# Patient Record
Sex: Female | Born: 1945 | Race: White | Hispanic: No | Smoking: Former smoker
Health system: Southern US, Community
[De-identification: ages and names within clinical notes are randomized; demographics above are authoritative.]

## PROBLEM LIST (undated history)

## (undated) DIAGNOSIS — Z9289 Personal history of other medical treatment: Secondary | ICD-10-CM

## (undated) DIAGNOSIS — Z807 Family history of other malignant neoplasms of lymphoid, hematopoietic and related tissues: Secondary | ICD-10-CM

## (undated) DIAGNOSIS — I10 Essential (primary) hypertension: Secondary | ICD-10-CM

## (undated) DIAGNOSIS — I7300 Raynaud's syndrome without gangrene: Secondary | ICD-10-CM

## (undated) DIAGNOSIS — M1990 Unspecified osteoarthritis, unspecified site: Secondary | ICD-10-CM

## (undated) DIAGNOSIS — M810 Age-related osteoporosis without current pathological fracture: Secondary | ICD-10-CM

## (undated) DIAGNOSIS — K219 Gastro-esophageal reflux disease without esophagitis: Secondary | ICD-10-CM

## (undated) DIAGNOSIS — J309 Allergic rhinitis, unspecified: Secondary | ICD-10-CM

## (undated) DIAGNOSIS — H269 Unspecified cataract: Secondary | ICD-10-CM

## (undated) DIAGNOSIS — E739 Lactose intolerance, unspecified: Secondary | ICD-10-CM

## (undated) DIAGNOSIS — F419 Anxiety disorder, unspecified: Secondary | ICD-10-CM

## (undated) DIAGNOSIS — K5790 Diverticulosis of intestine, part unspecified, without perforation or abscess without bleeding: Secondary | ICD-10-CM

## (undated) DIAGNOSIS — N904 Leukoplakia of vulva: Secondary | ICD-10-CM

## (undated) DIAGNOSIS — R351 Nocturia: Secondary | ICD-10-CM

## (undated) DIAGNOSIS — R0789 Other chest pain: Secondary | ICD-10-CM

## (undated) DIAGNOSIS — R042 Hemoptysis: Secondary | ICD-10-CM

## (undated) DIAGNOSIS — J189 Pneumonia, unspecified organism: Secondary | ICD-10-CM

## (undated) DIAGNOSIS — M549 Dorsalgia, unspecified: Secondary | ICD-10-CM

## (undated) DIAGNOSIS — J45909 Unspecified asthma, uncomplicated: Secondary | ICD-10-CM

## (undated) DIAGNOSIS — E7800 Pure hypercholesterolemia, unspecified: Secondary | ICD-10-CM

## (undated) DIAGNOSIS — G4700 Insomnia, unspecified: Secondary | ICD-10-CM

## (undated) HISTORY — DX: Personal history of other medical treatment: Z92.89

## (undated) HISTORY — DX: Dorsalgia, unspecified: M54.9

## (undated) HISTORY — DX: Essential (primary) hypertension: I10

## (undated) HISTORY — DX: Age-related osteoporosis without current pathological fracture: M81.0

## (undated) HISTORY — DX: Nocturia: R35.1

## (undated) HISTORY — DX: Raynaud's syndrome without gangrene: I73.00

## (undated) HISTORY — DX: Unspecified asthma, uncomplicated: J45.909

## (undated) HISTORY — DX: Diverticulosis of intestine, part unspecified, without perforation or abscess without bleeding: K57.90

## (undated) HISTORY — DX: Insomnia, unspecified: G47.00

## (undated) HISTORY — DX: Leukoplakia of vulva: N90.4

## (undated) HISTORY — DX: Lactose intolerance, unspecified: E73.9

## (undated) HISTORY — DX: Allergic rhinitis, unspecified: J30.9

## (undated) HISTORY — DX: Other chest pain: R07.89

## (undated) HISTORY — DX: Gastro-esophageal reflux disease without esophagitis: K21.9

## (undated) HISTORY — PX: COLONOSCOPY: SHX174

## (undated) HISTORY — PX: MYOMECTOMY: SHX85

## (undated) HISTORY — PX: MANDIBLE SURGERY: SHX707

## (undated) HISTORY — DX: Family history of other malignant neoplasms of lymphoid, hematopoietic and related tissues: Z80.7

## (undated) HISTORY — DX: Unspecified osteoarthritis, unspecified site: M19.90

## (undated) HISTORY — PX: TONSILLECTOMY: SUR1361

## (undated) HISTORY — DX: Hemoptysis: R04.2

## (undated) HISTORY — DX: Pure hypercholesterolemia, unspecified: E78.00

---

## 1969-12-12 HISTORY — PX: INGUINAL HERNIA REPAIR: SUR1180

## 1985-12-12 HISTORY — PX: TUBAL LIGATION: SHX77

## 1999-02-16 ENCOUNTER — Other Ambulatory Visit: Admission: RE | Admit: 1999-02-16 | Discharge: 1999-02-16 | Payer: Self-pay | Admitting: Obstetrics and Gynecology

## 1999-07-14 ENCOUNTER — Encounter (INDEPENDENT_AMBULATORY_CARE_PROVIDER_SITE_OTHER): Payer: Self-pay | Admitting: Specialist

## 1999-07-14 ENCOUNTER — Other Ambulatory Visit: Admission: RE | Admit: 1999-07-14 | Discharge: 1999-07-14 | Payer: Self-pay | Admitting: Obstetrics and Gynecology

## 1999-12-13 HISTORY — PX: BREAST SURGERY: SHX581

## 1999-12-31 ENCOUNTER — Encounter: Admission: RE | Admit: 1999-12-31 | Discharge: 2000-02-21 | Payer: Self-pay | Admitting: Physician Assistant

## 2000-02-21 ENCOUNTER — Encounter: Admission: RE | Admit: 2000-02-21 | Discharge: 2000-05-21 | Payer: Self-pay | Admitting: Sports Medicine

## 2000-02-22 ENCOUNTER — Other Ambulatory Visit: Admission: RE | Admit: 2000-02-22 | Discharge: 2000-02-22 | Payer: Self-pay | Admitting: Obstetrics and Gynecology

## 2000-02-23 ENCOUNTER — Encounter (INDEPENDENT_AMBULATORY_CARE_PROVIDER_SITE_OTHER): Payer: Self-pay

## 2000-02-23 ENCOUNTER — Other Ambulatory Visit: Admission: RE | Admit: 2000-02-23 | Discharge: 2000-02-23 | Payer: Self-pay | Admitting: Obstetrics and Gynecology

## 2000-11-07 ENCOUNTER — Encounter (INDEPENDENT_AMBULATORY_CARE_PROVIDER_SITE_OTHER): Payer: Self-pay | Admitting: Specialist

## 2000-11-07 ENCOUNTER — Other Ambulatory Visit: Admission: RE | Admit: 2000-11-07 | Discharge: 2000-11-07 | Payer: Self-pay | Admitting: Plastic Surgery

## 2001-02-09 ENCOUNTER — Other Ambulatory Visit: Admission: RE | Admit: 2001-02-09 | Discharge: 2001-02-09 | Payer: Self-pay | Admitting: Obstetrics and Gynecology

## 2001-12-12 HISTORY — PX: HYSTEROSCOPY: SHX211

## 2002-02-11 ENCOUNTER — Other Ambulatory Visit: Admission: RE | Admit: 2002-02-11 | Discharge: 2002-02-11 | Payer: Self-pay | Admitting: Obstetrics and Gynecology

## 2002-04-27 ENCOUNTER — Encounter (INDEPENDENT_AMBULATORY_CARE_PROVIDER_SITE_OTHER): Payer: Self-pay

## 2002-04-27 ENCOUNTER — Ambulatory Visit (HOSPITAL_COMMUNITY): Admission: RE | Admit: 2002-04-27 | Discharge: 2002-04-27 | Payer: Self-pay | Admitting: Obstetrics and Gynecology

## 2003-07-28 ENCOUNTER — Other Ambulatory Visit: Admission: RE | Admit: 2003-07-28 | Discharge: 2003-07-28 | Payer: Self-pay | Admitting: Obstetrics and Gynecology

## 2004-07-28 ENCOUNTER — Other Ambulatory Visit: Admission: RE | Admit: 2004-07-28 | Discharge: 2004-07-28 | Payer: Self-pay | Admitting: Obstetrics and Gynecology

## 2004-10-07 ENCOUNTER — Encounter: Admission: RE | Admit: 2004-10-07 | Discharge: 2004-10-07 | Payer: Self-pay | Admitting: Gastroenterology

## 2005-08-03 ENCOUNTER — Other Ambulatory Visit: Admission: RE | Admit: 2005-08-03 | Discharge: 2005-08-03 | Payer: Self-pay | Admitting: Obstetrics and Gynecology

## 2005-12-12 DIAGNOSIS — R042 Hemoptysis: Secondary | ICD-10-CM

## 2005-12-12 HISTORY — DX: Hemoptysis: R04.2

## 2006-03-25 ENCOUNTER — Emergency Department (HOSPITAL_COMMUNITY): Admission: EM | Admit: 2006-03-25 | Discharge: 2006-03-25 | Payer: Self-pay | Admitting: Emergency Medicine

## 2006-03-26 ENCOUNTER — Encounter: Admission: RE | Admit: 2006-03-26 | Discharge: 2006-03-26 | Payer: Self-pay | Admitting: Gastroenterology

## 2006-03-27 ENCOUNTER — Ambulatory Visit: Payer: Self-pay | Admitting: Internal Medicine

## 2006-04-11 ENCOUNTER — Ambulatory Visit: Payer: Self-pay | Admitting: Internal Medicine

## 2006-04-12 ENCOUNTER — Ambulatory Visit: Payer: Self-pay | Admitting: Cardiology

## 2006-04-17 ENCOUNTER — Ambulatory Visit: Payer: Self-pay | Admitting: Internal Medicine

## 2006-05-01 ENCOUNTER — Ambulatory Visit: Payer: Self-pay | Admitting: Internal Medicine

## 2006-07-03 ENCOUNTER — Ambulatory Visit: Payer: Self-pay | Admitting: Internal Medicine

## 2006-08-09 ENCOUNTER — Other Ambulatory Visit: Admission: RE | Admit: 2006-08-09 | Discharge: 2006-08-09 | Payer: Self-pay | Admitting: Obstetrics and Gynecology

## 2006-12-12 DIAGNOSIS — N904 Leukoplakia of vulva: Secondary | ICD-10-CM

## 2006-12-12 HISTORY — DX: Leukoplakia of vulva: N90.4

## 2007-08-15 ENCOUNTER — Other Ambulatory Visit: Admission: RE | Admit: 2007-08-15 | Discharge: 2007-08-15 | Payer: Self-pay | Admitting: Obstetrics and Gynecology

## 2007-12-13 HISTORY — PX: FOOT SURGERY: SHX648

## 2008-09-15 ENCOUNTER — Ambulatory Visit: Payer: Self-pay | Admitting: Obstetrics and Gynecology

## 2008-09-15 ENCOUNTER — Other Ambulatory Visit: Admission: RE | Admit: 2008-09-15 | Discharge: 2008-09-15 | Payer: Self-pay | Admitting: Obstetrics and Gynecology

## 2008-09-15 ENCOUNTER — Encounter: Payer: Self-pay | Admitting: Obstetrics and Gynecology

## 2008-09-24 ENCOUNTER — Ambulatory Visit: Payer: Self-pay | Admitting: Obstetrics and Gynecology

## 2008-10-28 ENCOUNTER — Encounter: Admission: RE | Admit: 2008-10-28 | Discharge: 2008-10-28 | Payer: Self-pay | Admitting: Gastroenterology

## 2008-12-22 ENCOUNTER — Ambulatory Visit: Payer: Self-pay | Admitting: Internal Medicine

## 2008-12-22 DIAGNOSIS — R05 Cough: Secondary | ICD-10-CM

## 2008-12-22 DIAGNOSIS — I10 Essential (primary) hypertension: Secondary | ICD-10-CM | POA: Insufficient documentation

## 2008-12-22 DIAGNOSIS — R059 Cough, unspecified: Secondary | ICD-10-CM | POA: Insufficient documentation

## 2008-12-22 DIAGNOSIS — R042 Hemoptysis: Secondary | ICD-10-CM | POA: Insufficient documentation

## 2008-12-22 DIAGNOSIS — E785 Hyperlipidemia, unspecified: Secondary | ICD-10-CM | POA: Insufficient documentation

## 2008-12-23 ENCOUNTER — Telehealth (INDEPENDENT_AMBULATORY_CARE_PROVIDER_SITE_OTHER): Payer: Self-pay | Admitting: *Deleted

## 2008-12-25 ENCOUNTER — Telehealth (INDEPENDENT_AMBULATORY_CARE_PROVIDER_SITE_OTHER): Payer: Self-pay | Admitting: *Deleted

## 2008-12-30 ENCOUNTER — Ambulatory Visit: Payer: Self-pay | Admitting: Internal Medicine

## 2009-02-02 ENCOUNTER — Ambulatory Visit: Payer: Self-pay | Admitting: Obstetrics and Gynecology

## 2009-05-05 ENCOUNTER — Encounter: Payer: Self-pay | Admitting: Internal Medicine

## 2009-07-27 ENCOUNTER — Ambulatory Visit: Payer: Self-pay | Admitting: Obstetrics and Gynecology

## 2009-09-24 ENCOUNTER — Ambulatory Visit: Payer: Self-pay | Admitting: Obstetrics and Gynecology

## 2009-09-24 ENCOUNTER — Encounter: Payer: Self-pay | Admitting: Obstetrics and Gynecology

## 2009-09-24 ENCOUNTER — Other Ambulatory Visit: Admission: RE | Admit: 2009-09-24 | Discharge: 2009-09-24 | Payer: Self-pay | Admitting: Obstetrics and Gynecology

## 2009-10-19 ENCOUNTER — Ambulatory Visit: Payer: Self-pay | Admitting: Internal Medicine

## 2009-10-21 ENCOUNTER — Telehealth: Payer: Self-pay | Admitting: Adult Health

## 2009-10-26 ENCOUNTER — Telehealth (INDEPENDENT_AMBULATORY_CARE_PROVIDER_SITE_OTHER): Payer: Self-pay | Admitting: *Deleted

## 2009-10-27 ENCOUNTER — Ambulatory Visit: Payer: Self-pay | Admitting: Internal Medicine

## 2009-10-27 DIAGNOSIS — J82 Pulmonary eosinophilia, not elsewhere classified: Secondary | ICD-10-CM

## 2009-11-23 ENCOUNTER — Ambulatory Visit: Payer: Self-pay | Admitting: Internal Medicine

## 2009-11-23 DIAGNOSIS — J310 Chronic rhinitis: Secondary | ICD-10-CM | POA: Insufficient documentation

## 2010-02-08 ENCOUNTER — Encounter: Payer: Self-pay | Admitting: Internal Medicine

## 2010-02-16 ENCOUNTER — Encounter: Payer: Self-pay | Admitting: Internal Medicine

## 2010-03-15 ENCOUNTER — Telehealth (INDEPENDENT_AMBULATORY_CARE_PROVIDER_SITE_OTHER): Payer: Self-pay | Admitting: *Deleted

## 2010-03-19 ENCOUNTER — Ambulatory Visit: Payer: Self-pay | Admitting: Internal Medicine

## 2010-06-21 ENCOUNTER — Ambulatory Visit: Payer: Self-pay | Admitting: Internal Medicine

## 2010-06-21 ENCOUNTER — Telehealth (INDEPENDENT_AMBULATORY_CARE_PROVIDER_SITE_OTHER): Payer: Self-pay | Admitting: *Deleted

## 2010-07-07 ENCOUNTER — Ambulatory Visit: Payer: Self-pay | Admitting: Internal Medicine

## 2010-07-08 ENCOUNTER — Telehealth: Payer: Self-pay | Admitting: Internal Medicine

## 2010-08-02 ENCOUNTER — Ambulatory Visit: Payer: Self-pay | Admitting: Obstetrics and Gynecology

## 2010-09-27 ENCOUNTER — Other Ambulatory Visit: Admission: RE | Admit: 2010-09-27 | Discharge: 2010-09-27 | Payer: Self-pay | Admitting: Obstetrics and Gynecology

## 2010-09-27 ENCOUNTER — Ambulatory Visit: Payer: Self-pay | Admitting: Obstetrics and Gynecology

## 2011-01-11 NOTE — Assessment & Plan Note (Signed)
Summary: Pulmonary/ ext f/u ov   Copy to:  [name] Primary Provider/Referring Provider:  Dr. Merleen Milliner  CC:  Acute visit for sob for 2-3 days. She does have a prod cough and c/o chest tightness.Madison Kramer  History of Present Illness: 65 yowf quit smoking 1991 with recurrent tickle in throat since 1997 better on ppi when she remembers to take it with chronic scarring over rml/ lingula on cxr.    March 19, 2010 cc mucus thing typically after breakfast x years "always" , no better since started allergy shots March [address]109,  not consistently using any GERD Rx  because "doesn't have reflux".   Acute visit for sob for 2-3 days with  non prod cough and c/o chest tightness but no wheeze.  Pt denies any significant sore throat, dysphagia, itching, sneezing,   fever, chills, sweats, unintended wt loss, pleuritic or exertional cp, hempoptysis, change in activity tolerance  orthopnea pnd or leg swelling. Pt also denies any obvious fluctuation in symptoms with weather or environmental change or other alleviating or aggravating factors.       Current Medications (verified): 1)  Simvastatin 40 Mg Tabs (Simvastatin) .Madison Kramer.. 1 Once Daily 2)  Zyrtec Allergy 10 Mg Tabs (Cetirizine Hcl) .Madison Kramer.. 1 Once Daily 3)  Prilosec Otc 20 Mg Tbec (Omeprazole Magnesium) .... As Needed 4)  Aspirin 81 Mg Tabs (Aspirin) .Madison Kramer.. 1 By Mouth Daily  Allergies (verified): 1)  ! Codeine 2)  ! Sulfa 3)  ! Erythromycin  Past History:  Past Medical History: Hyperlipidemia Hypertension Chronic rhinitis........................................................Madison KitchenDr Jethro Bolus    - neg sinus ct  04/12/06    - started allergy shots started March 2010 RML/ Lingular scarring    - Cxr without change October 27, 2009     - CXR repeat March 19, 2010 no change   Vital Signs:  Patient profile:   65 year old female Height:      64 inches (162.56 cm) Weight:      117 pounds (53.18 kg) BMI:     20.16 O2 Sat:      96 % on Room air Temp:     98.1  degrees F (36.72 degrees C) oral Pulse rate:   88 / minute BP sitting:   112 / 70  (right arm) Cuff size:   regular  Vitals Entered By: Michel Bickers CMA (March 19, 2010 10:13 AM)  O2 Sat at Rest %:  96 O2 Flow:  Room air  Physical Exam  Additional Exam:  wt 119 December 22, 2008  > 121 December 30, 2008 > 116 October 27, 2009 > 114 November 23, 2009 > 117 March 19, 2010  Ambulatory healthy but anxious wf  in no acute distress. HEENT: nl dentition  and orophanx. Nl external ear canals without cough reflex,  mod nonspecific  turbinate edema, mucoid secretions. no excess pnd Neck without JVD/Nodes/TM Lungs clear to A & P no cough on insp/exp RRR no s3 or murmur or increase in P2 Abd soft and benign with nl excursion in the supine position. No bruits or organomegaly Ext warm without calf tenderness, cyanosis clubbing or edema Skin warm and dry without lesions     CXR  Procedure date:  03/19/2010  Findings:      Comparison: [facility] chest x-ray 10/27/2009 and 04/11/2006 and [facility] chest x-ray 03/25/2006 and [facility] at [address] chest CT 03/26/2006.   Findings: No change in chronic interstitial opacities at the lingula and  medial right middle lobe with slight right apical nodular scarring.  Lungs are otherwise clear.  Heart size remains normal.  Mediastinum, hila, pleura and osseous structures appear normal.   IMPRESSION:   1.  Stable slight chronic interstitial opacities at the lingula and medial right middle lobe and slight right apical scarring. 2.  No interval acute abnormality.  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  Upper airway cough syndrome, so named because it's frequently impossible to sort out how much is LPR vs CR/sinusitis with freq throat clearing generating secondary extra esophageal GERD from wide swings in gastric pressure that occur with throat clearing, promoting self use of mint and menthol lozenges that reduce the lower esophageal sphincter tone and exacerbate the problem further.  These symptoms are easily confused with asthma/copd by even experienced pulmonlogists because they overlap so much. These are the same pts who not infrequently have failed to tolerate ace inhibitors,  dry powder inhalers or biphosphonates or report having reflux symptoms that don't respond to standard doses of PPI  For now try max doses of ppi and h2 at hs and regroup in 6 weeks  Medications Added to Medication List This Visit: 1)  Prilosec Otc 20 Mg Tbec (Omeprazole magnesium) .... Take  one 30-60 min before first meal of the day 2)  Aspirin 81 Mg Tabs (Aspirin) .Madison Kramer.. 1 by mouth daily 3)  Prednisone 10 Mg Tabs (Prednisone) .... 4 each am x 2days, 2x2days, 1x2days and stop  Other Orders: T-2 View CXR (71020TC) Est. Patient Level IV (24401)  Patient Instructions: 1)  Acid reflux is a  leading suspect here and needs to be eliminated  completely before considering additional studies or treatment options. To suppress this maximally, take prilosec  before first  last and pepcid 20 mg (otc) at bedtime plus diet measures as listed. If better would continue for another 6 weeks then regroup  2)  Prednisone 10 mg 4 each am x 2days, 2x2days, 1x2days and stop 3)  Mucinex dm is the best over the counter cough medication  4)  NB the  ramp to expected improvement (and for that matter, worsening, if a chronic effective medication is stopped)  can be measured in weeks, not days, a common misconception because this is not Heartburn with no immediate cause and effect relationship so that response to therapy or lack  thereof can be very difficult to assess.   5)  GERD (REFLUX)  is a common cause of respiratory symptoms. It commonly presents without heartburn and can be treated with medication, but also with lifestyle changes including avoidance of late meals, excessive alcohol, smoking cessation, and avoid fatty foods, chocolate, peppermint, colas, red wine, and acidic juices such as orange juice. NO MINT OR MENTHOL PRODUCTS SO NO COUGH DROPS  6)  USE SUGARLESS CANDY INSTEAD (jolley ranchers)  7)  NO OIL BASED VITAMINS  8)  Right middle and lingula are poorly designed so anything that gets in there festers and causes inflammation which causing scarring  Prescriptions: PREDNISONE 10 MG  TABS (PREDNISONE) 4 each am x 2days, 2x2days, 1x2days and stop  #14 x 0   Entered and Authorized by:   Nyoka Cowden MD   Signed  by:   Nyoka Cowden MD on 03/19/2010   Method used:   Electronically to        [facility]* (retail)       [address]       [place], [place]  [id number]       Ph: [phone]       Fax: [phone]   RxID:   [id number]

## 2011-01-11 NOTE — Progress Notes (Signed)
Summary: lunesta sub > change to xanax prn  Phone Note Call from Patient   Caller: Patient Summary of Call: Pt asking for a different med to help her sleep "just while I'm sick"- she states that lunesta was 8$per pill and she does not want to pay that much.  She wonders if xanax could be called in.  Pls advise, thanks! [facility] Initial call taken by: Vernie Murders,  June 21, 2010 5:33 PM  Follow-up for Phone Call        ok to substitute xanax 0.5 mg one at bedtime if needed # 30 no refills Follow-up by: Nyoka Cowden MD,  June 21, 2010 7:53 PM  Additional Follow-up for Phone Call Additional follow up Details #1::        Patient is aware of xanax RX and will be sent to [facility]. Left RX on automated system at pharmacy. Pharmacy was not open at this time. Additional Follow-up by: Michel Bickers CMA,  June 22, 2010 8:41 AM    New/Updated Medications: XANAX 0.5 MG TABS (ALPRAZOLAM) 1 by mouth at bedtime as needed Prescriptions: XANAX 0.5 MG TABS (ALPRAZOLAM) 1 by mouth at bedtime as needed  #30 x 0   Entered by:   Michel Bickers CMA   Authorized by:   Nyoka Cowden MD   Signed by:   Michel Bickers CMA on 06/22/2010   Method used:   Telephoned to ...       [facility]* (retail)       [address]       [place], [place]  [address]       Ph: [phone]       Fax: [phone]   RxID:   [id number]

## 2011-01-11 NOTE — Assessment & Plan Note (Signed)
Summary: Pulmonary/ final summary f/u ov - no further pulm f/u planned   Copy to:  [name] Primary Provider/Referring Provider:  Dr Kirby Funk  CC:  2 wk followup.  Pt states that the chest tightness and cough has pretty much resolved.  No complaints today.Marland Kitchen  History of Present Illness: 22 yowf quit smoking 1991 with recurrent tickle in throat since 1997 better on ppi when she remembers to take it with chronic scarring over rml/ lingula on cxr.    March 19, 2010 cc mucus thing typically after breakfast x years "always" , no better since started allergy shots March 16109,  not consistently using any GERD Rx  because "doesn't have reflux".   Acute visit for sob for 2-3 days with  non prod cough and c/o chest tightness but no wheeze.  rec two times a day ppi and hs h2 but unable to tolerate bidppi so just took it once and didn't seem to help.  June 21, 2010 Acute visit.  Pt c/o chest tightness and cough x 1 wk.  She states that cough is mainly dry-sometimes prod with minimal sputum.  She also c/o fatigue. better 30 min after uses ventolin but rarely feels the need for it. Zyrtec stop Prednsione 10 mg 4 each am x 2days, 2x2days, 1x2days and stop Chlortrimeton 4 mg  one ever 6h as needed for drainage Ventolin 2 puffs every 4 hours if chest tightness but should not need this if we address the underlying problem effectively  Dexilant 60 mg before bfast and pepcid 20 mg at bedtime   July 07, 2010 2 wk followup.  Pt states that the chest tightness and cough have pretty much resolved.  No complaints today. much better on dexilant and pepcid at bedtime.  Pt denies any significant sore throat, dysphagia, itching, sneezing,  nasal congestion or excess secretions,  fever, chills, sweats, unintended wt loss, pleuritic or exertional cp, hempoptysis, change in activity tolerance  orthopnea pnd or leg swelling.   Current Medications (verified): 1)  Simvastatin 40 Mg Tabs (Simvastatin) .Marland Kitchen.. 1 Once Daily 2)   Aspirin 81 Mg Tabs (Aspirin) .Marland Kitchen.. 1 By Mouth Daily 3)  Dexilant 60 Mg Cpdr (Dexlansoprazole) .... Take  One 30-60 Min Before First Meal of The Day 4)  Pepcid Ac Maximum Strength 20 Mg Tabs (Famotidine) .... One At Bedtime 5)  Ventolin Hfa 108 (90 Base) Mcg/act Aers (Albuterol Sulfate) .... 2 Puffs Every 4hours As Needed  Allergies (verified): 1)  ! Codeine 2)  ! Sulfa 3)  ! Erythromycin  Past History:  Past Medical History: Hyperlipidemia Hypertension Chronic rhinitis............................................................Marland KitchenDr Jethro Bolus    - neg sinus ct  04/12/06    - started allergy shots started March 2010 RML/ Lingular scarring    - Cxr without change October 27, 2009     - CXR repeat March 19, 2010 no change   Vital Signs:  Patient profile:   65 year old female Weight:      115 pounds O2 Sat:      98 % on Room air Temp:     98.2 degrees F oral Pulse rate:   58 / minute BP sitting:   96 / 60  (left arm)  Vitals Entered By: Vernie Murders (July 07, 2010 10:54 AM)  O2 Flow:  Room air  Physical Exam  Additional Exam:  wt 119 December 22, 2008  > 121 December 30, 2008 >    117 March 19, 2010 > 115 June 21, 2010 > 115 July 07, 2010  Ambulatory healthy but anxious wf  in no acute distress. HEENT: nl dentition  and orophanx. Nl external ear canals without cough reflex,  mod nonspecific  turbinate edema, mucoid secretions. no excess pnd Neck without JVD/Nodes/TM Lungs clear to A & P no cough on insp/exp RRR no s3 or murmur or increase in P2 Abd soft and benign with nl excursion in the supine position. No bruits or organomegaly Ext warm without calf tenderness, cyanosis clubbing or edema Skin warm and dry without lesions     Impression & Recommendations:  Problem # 1:  COUGH (ICD-786.2)   This is most c/w  Classic Upper airway cough syndrome, so named because it's frequently impossible to sort out how much is  CR/sinusitis with freq throat clearing (which can be  related to primary GERD)   vs  causing  secondary extra esophageal GERD from wide swings in gastric pressure that occur with throat clearing, promoting self use of mint and menthol lozenges that reduce the lower esophageal sphincter tone and exacerbate the problem further These are the same pts who not infrequently have failed to tolerate ace inhibitors,  dry powder inhalers or biphosphonates or report having reflux symptoms that don't respond to standard doses of PPI  I had an extended discussion with the patient today lasting 15 to 20 minutes of a 25 minute visit on the following issues:   For now rx max for gerd and nonspecific rhinitis with 1st generation H1 seems to be effective - if makes it 3 months with this good of control vs baseline then need to determine longterm rx with ppi re risks/benefits/costs etc defer to Dr Clifton James  strongly now doubt  cough variant asthma but has saba just in case  Orders: Est. Patient Level IV (78295)  Problem # 2:  CHRONIC RHINITIS (ICD-472.0)  per Dr Lamarr Lulas, no further f/u at [facility] healthcare planned  Orders: Est. 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  Pleuroparenchymal opacity the apices is stable, most likely related to scarring. The cardiopericardial silhouette is within normal limits for size. Imaged bony structures of the thorax are intact.   IMPRESSION: Stable.  No new or progressive findings  03/19/2010: CXR Results:  Comparison: [facility] chest x-ray 10/27/2009 and 04/11/2006 and [facility] chest x-ray  03/25/2006 and [facility] at [address] chest CT 03/26/2006.   Findings: No change in chronic interstitial opacities at the lingula and medial right middle lobe with slight right apical nodular scarring.  Lungs are otherwise clear.  Heart size remains normal.  Mediastinum, hila, pleura and osseous structures appear normal.   IMPRESSION:   1.  Stable slight chronic interstitial opacities at the lingula and medial right middle lobe and slight right apical scarring. 2.  No interval acute abnormality.  10/27/2009: CXR Results:  Followup.  Pt states that she is going out of town in a few days and wanted    Patient Instructions: 1)  Chlortrimeton 4 mg  one ever 6h as needed for drainage 2)  Ventolin 2 puffs every 4 hours if chest tightness  but should not need this if we address the underlying problem effectively  3)  Dexilant 60 mg before bfast and pepcid 20 mg at bedtime  4)  Would stay on this plan until see Dr Valentina Lucks in October to establish a baseline in terms of symptom control Prescriptions: DEXILANT 60 MG CPDR (DEXLANSOPRAZOLE) Take  one 30-60 min before first meal of the day  #34 x 2   Entered and Authorized by:   Nyoka Cowden MD   Signed by:   Nyoka Cowden MD on 07/07/2010   Method used:   Electronically to        [facility]* (retail)       [address]       [place], [place]  [address]       Ph: [phone]       Fax: [phone]   RxID:   [id number]

## 2011-01-11 NOTE — Assessment & Plan Note (Signed)
Summary: Pulmonary/ ext ov for cough/ chest tightness   Copy to:  [name] Primary Provider/Referring Provider:  Dr. Merleen Milliner  CC:  Acute visit.  Pt c/o chest tightness and cough x 1 wk.  She states that cough is mainly dry-sometimes prod with minimal sputum.  She also c/o fatigue.Marland Kitchen  History of Present Illness: 25 yowf quit smoking 1991 with recurrent tickle in throat since 1997 better on ppi when she remembers to take it with chronic scarring over rml/ lingula on cxr.    March 19, 2010 cc mucus thing typically after breakfast x years "always" , no better since started allergy shots March 16109,  not consistently using any GERD Rx  because "doesn't have reflux".   Acute visit for sob for 2-3 days with  non prod cough and c/o chest tightness but no wheeze.  rec two times a day ppi and hs h2 but unable to tolerate bidppi so just took it once and didn't seem to help.  June 21, 2010 Acute visit.  Pt c/o chest tightness and cough x 1 wk.  She states that cough is mainly dry-sometimes prod with minimal sputum.  She also c/o fatigue. better 30 min after uses ventolin but rarely feels the need for it.  Pt denies any significant sore throat, dysphagia, itching, sneezing,  nasal congestion or excess secretions,  fever, chills, sweats, unintended wt loss, pleuritic or exertional cp, hempoptysis, change in activity tolerance  orthopnea pnd or leg swelling. Pt also denies any obvious fluctuation in symptoms with weather or environmental change or other alleviating or aggravating factors.       Current Medications (verified): 1)  Simvastatin 40 Mg Tabs (Simvastatin) .Marland Kitchen.. 1 Once Daily 2)  Zyrtec Allergy 10 Mg Tabs (Cetirizine Hcl) .Marland Kitchen.. 1 Once Daily As Needed 3)  Aspirin 81 Mg Tabs (Aspirin) .Marland Kitchen.. 1 By Mouth Daily 4)  Losartan Potassium 50 Mg Tabs (Losartan Potassium) .Marland Kitchen.. 1 Once Daily  Allergies (verified): 1)  ! Codeine 2)  ! Sulfa 3)  ! Erythromycin  Past History:  Past Medical  History: Hyperlipidemia Hypertension Chronic rhinitis...........................................................Marland KitchenDr Jethro Bolus    - neg sinus ct  04/12/06    - started allergy shots started March 2010 RML/ Lingular scarring    - Cxr without change October 27, 2009     - CXR repeat March 19, 2010 no change   Vital Signs:  Patient profile:   65 year old female Weight:      115 pounds O2 Sat:      99 % on Room air Temp:     97.8 degrees F oral Pulse rate:   64 / minute BP sitting:   100 / 60  (left arm)  Vitals Entered By: Vernie Murders (June 21, 2010 10:37 AM)  O2 Flow:  Room air  Physical Exam  Additional Exam:  wt 119 December 22, 2008  > 121 December 30, 2008 >    117 March 19, 2010 > 115 June 21, 2010  Ambulatory healthy but anxious wf  in no acute distress. HEENT: nl dentition  and orophanx. Nl external ear canals without cough reflex,  mod nonspecific  turbinate edema, mucoid secretions. no excess pnd Neck without JVD/Nodes/TM Lungs clear to A & P no cough on insp/exp RRR no s3 or murmur or increase in P2 Abd soft and benign with nl excursion in the supine position. No bruits or organomegaly Ext warm without calf tenderness, cyanosis clubbing or edema Skin warm and dry without  lesions     CXR  Procedure date:  06/21/2010  Findings:        Comparison: 03/19/2010   Findings: Mild hyperexpansion without focal consolidation, edema, or pleural effusion.  Pleuroparenchymal opacity the apices is stable, most likely related to scarring. The cardiopericardial silhouette is within normal limits for size. Imaged bony structures of the thorax are intact.   IMPRESSION: Stable.  No new or progressive findings  Impression & Recommendations:  Problem # 1:  COUGH (ICD-786.2) The most common causes of chronic cough in immunocompetent adults include: upper airway cough syndrome (UACS), previously referred to as postnasal drip syndrome,  caused by variety of rhinosinus  conditions; (2) asthma; (3) GERD; (4) chronic bronchitis from cigarette smoking or other inhaled environmental irritants; (5) nonasthmatic eosinophilic bronchitis; and (6) bronchiectasis. These conditions, singly or in combination, have accounted for up to 94% of the causes of chronic cough in prospective studies.   This is most c/w  Classic Upper airway cough syndrome, so named because it's frequently impossible to sort out how much is  CR/sinusitis with freq throat clearing (which can be related to primary GERD)   vs  causing  secondary extra esophageal GERD from wide swings in gastric pressure that occur with throat clearing, promoting self use of mint and menthol lozenges that reduce the lower esophageal sphincter tone and exacerbate the problem further These are the same pts who not infrequently have failed to tolerate ace inhibitors,  dry powder inhalers or biphosphonates or report having reflux symptoms that don't respond to standard doses of PPI  For now rx max for gerd and nonspecific rhinitis with 1st generation H1   Could also have cough variant asthma bases on perceived benefit from ventolin so mct next step  Medications Added to Medication List This Visit: 1)  Zyrtec Allergy 10 Mg Tabs (Cetirizine hcl) .Marland Kitchen.. 1 once daily as needed 2)  Losartan Potassium 50 Mg Tabs (Losartan potassium) .Marland Kitchen.. 1 once daily 3)  Lunesta 2 Mg Tabs (Eszopiclone) .... One at bedtime if needed 4)  Prednisone 10 Mg Tabs (Prednisone) .... 4 each am x 2days, 2x2days, 1x2days and stop 5)  Dexilant 60 Mg Cpdr (Dexlansoprazole) .... Take  one 30-60 min before first meal of the day 6)  Pepcid Ac Maximum Strength 20 Mg Tabs (Famotidine) .... One at bedtime 7)  Ventolin Hfa 108 (90 Base) Mcg/act Aers (Albuterol sulfate) .... 2 puffs every 4hours as needed  Other Orders: T-2 View CXR (71020TC)  Patient Instructions: 1)  Unlike when you get a prescription for eyeglasses, it's not possible to always walk out of this or  any medical office with a perfect prescription that is immediately effective  based on any test that we offer here.  On the contrary, it may take several weeks for the full impact of changes recommened today - hopefully you will respond well.  If not, then we'll adjust your medication on your next visit accordingly, knowing more then than we can possibly know now.    2)  NB the  ramp to expected improvement (and for that matter, worsening, if a chronic effective medication is stopped)  can be measured in weeks, not days, a common misconception because this is not Heartburn with no immediate cause and effect relationship so that response to therapy or lack thereof can be very difficult to assess.  3)  GERD (REFLUX)  is a common cause of respiratory symptoms. It commonly presents without heartburn and can be treated with medication, but  also with lifestyle changes including avoidance of late meals, excessive alcohol, smoking cessation, and avoid fatty foods, chocolate, peppermint, colas, red wine, and acidic juices such as orange juice. NO MINT OR MENTHOL PRODUCTS SO NO COUGH DROPS  4)  USE SUGARLESS CANDY INSTEAD (jolley ranchers)  5)  NO OIL BASED VITAMINS 6)  Please schedule a follow-up appointment in 2 weeks, sooner if needed  7)  Zyrtec stop 8)  Prednsione 10 mg 4 each am x 2days, 2x2days, 1x2days and stop 9)  Chlortrimeton 4 mg  one ever 6h as needed for drainage 10)  Ventolin 2 puffs every 4 hours if chest tightness but should not need this if we address the underlying problem effectively  11)  Dexilant 60 mg before bfast and pepcid 20 mg at bedtime  Prescriptions: PREDNISONE 10 MG  TABS (PREDNISONE) 4 each am x 2days, 2x2days, 1x2days and stop  #14 x 0   Entered and Authorized by:   Nyoka Cowden MD   Signed by:   Nyoka Cowden MD on 06/21/2010   Method used:   Electronically to        [facility]* (retail)       [address]       [place], [place]  [id number]       Ph:  [phone]       Fax: [phone]   RxID:   [id number] LUNESTA 2 MG TABS (ESZOPICLONE) one at bedtime if needed  #10 x 0   Entered and Authorized by:   Nyoka Cowden MD   Signed by:   Nyoka Cowden MD on 06/21/2010   Method used:   Print then Give to Patient   RxID:   [id number]   Appended Document: Orders Update    Clinical Lists Changes  Orders: Added new Service order of Est. Patient Level IV (34742) - Signed

## 2011-01-11 NOTE — Progress Notes (Signed)
Summary: needs f/u cxr-appt sched  ---- Converted from flag ---- ---- 11/23/2009 11:51 AM, Nyoka Cowden MD wrote:  ------------------------------  Phone Note Call from Patient   Caller: Patient Call For: wert Summary of Call: pt called back, was told it was time for her appt, she stated she would check her schedule and call back. Initial call taken by: Eugene Gavia,  March 16, 2010 8:49 AM    Pt due for followup with cxr this month. LMTCB Vernie Murders  March 15, 2010 11:48 AM  LMTCB Vernie Murders  March 18, 2010 4:08 PM Pt has appt ith MW this am- Vernie Murders  March 19, 2010 9:43 AM

## 2011-01-11 NOTE — Progress Notes (Signed)
Summary: Dexilant coverage > change to nexium  Phone Note From Pharmacy Call back at [facility] [phone]   Caller: [facility]* Call For: Dr. Sherene Sires  Summary of Call: Received fax from [facility] in ref to Dexilant DR 60. "Ins prefers gen ppi or nexium. This drug req prior auth. Call ins co [phone] or change to alt drug. Let us know!"  Please advise if you would like to change medication or start prior auth. Thanks. Zackery Barefoot CMA  July 08, 2010 11:34 AM  ok to use Nexium instead Initial call taken by: Nyoka Cowden MD,  July 09, 2010 8:19 AM  Follow-up for Phone Call        [facility] pharmacy and not open until 9am, called after 9 am and line is busy. will try again later. Zackery Barefoot CMA  July 09, 2010 9:11 AM   Additional Follow-up for Phone Call Additional follow up Details #1::        Phone call completed, Pharmacist called. Zackery Barefoot CMA  July 09, 2010 10:55 AM     New/Updated Medications: NEXIUM 40 MG CPDR (ESOMEPRAZOLE MAGNESIUM) Take  one 30-60 min before first meal of the day Prescriptions: NEXIUM 40 MG CPDR (ESOMEPRAZOLE MAGNESIUM) Take  one 30-60 min before first meal of the day  #34 x 0   Entered by:   Zackery Barefoot CMA   Authorized by:   Nyoka Cowden MD   Signed by:   Zackery Barefoot CMA on 07/09/2010   Method used:   Electronically to        [facility]* (retail)       [address]       [place], [place]  [id number]       Ph: [phone]       Fax: [phone]   RxID:   [id number]

## 2011-01-14 NOTE — Letter (Signed)
Summary: [facility]  [facility]   Imported By: Lester Upland 02/18/2010 11:52:55  _____________________________________________________________________  External Attachment:    Type:   Image     Comment:   External Document

## 2011-03-17 ENCOUNTER — Other Ambulatory Visit (HOSPITAL_COMMUNITY): Payer: Self-pay | Admitting: Orthopedic Surgery

## 2011-03-17 DIAGNOSIS — S2239XA Fracture of one rib, unspecified side, initial encounter for closed fracture: Secondary | ICD-10-CM

## 2011-03-25 ENCOUNTER — Encounter (HOSPITAL_COMMUNITY)
Admission: RE | Admit: 2011-03-25 | Discharge: 2011-03-25 | Disposition: A | Discharge status: Home / Self Care | Payer: BC Managed Care – PPO | Source: Ambulatory Visit | Attending: Orthopedic Surgery | Admitting: Orthopedic Surgery

## 2011-03-25 ENCOUNTER — Encounter (HOSPITAL_COMMUNITY): Payer: Self-pay

## 2011-03-25 ENCOUNTER — Other Ambulatory Visit (HOSPITAL_COMMUNITY): Payer: Self-pay | Admitting: Orthopedic Surgery

## 2011-03-25 DIAGNOSIS — T148XXA Other injury of unspecified body region, initial encounter: Secondary | ICD-10-CM

## 2011-03-25 DIAGNOSIS — R079 Chest pain, unspecified: Secondary | ICD-10-CM | POA: Insufficient documentation

## 2011-03-25 DIAGNOSIS — S2239XA Fracture of one rib, unspecified side, initial encounter for closed fracture: Secondary | ICD-10-CM | POA: Insufficient documentation

## 2011-03-25 DIAGNOSIS — M47812 Spondylosis without myelopathy or radiculopathy, cervical region: Secondary | ICD-10-CM | POA: Insufficient documentation

## 2011-03-25 DIAGNOSIS — X58XXXA Exposure to other specified factors, initial encounter: Secondary | ICD-10-CM | POA: Insufficient documentation

## 2011-03-25 DIAGNOSIS — M47817 Spondylosis without myelopathy or radiculopathy, lumbosacral region: Secondary | ICD-10-CM | POA: Insufficient documentation

## 2011-03-25 MED ORDER — TECHNETIUM TC 99M MEDRONATE IV KIT
25.0000 | PACK | Freq: Once | INTRAVENOUS | Status: AC | PRN
  Administered 2011-03-25: 23.2 via INTRAVENOUS

## 2011-04-29 NOTE — Op Note (Signed)
[facility]  Patient:    Madison Kramer, Madison Kramer Visit Number: [id number] MRN: [id number]          Service Type: DSU Location: [facility] Attending Physician:  Sharon Mt Dictated by:   Rande Brunt. Eda Paschal, M.D. Proc. Date: 04/27/02 Admit Date:  04/27/2002 Discharge Date: 04/27/2002                             Operative Report  PREOPERATIVE DIAGNOSES:       Postmenopausal bleeding, endometrial polyp.  POSTOPERATIVE DIAGNOSES:      Postmenopausal bleeding, submucous myoma.  NAME OF OPERATION:            Hysteroscopy with excision of submucous myoma.  SURGEON:                      Daniel L. Eda Paschal, M.D.  ANESTHESIA:                   General.  INDICATIONS:                  Patient is a 65 year old gravida 2, para 2, AB0 who was postmenopausal and then started to have postmenopausal bleeding not on hormone replacement therapy.  Endometrial biopsy in the office was negative but she continued to have bleeding.  Sonohysterogram was then done in the office and she had an intracavitary defect which was probably an endometrial polyp and she now enters the hospital for excision of above.  FINDINGS:                     External and vaginal examination are within normal limits.  Cervix is clean.  Uterus is slightly enlarged by a small myoma to about 7 weeks size.  There is 1-1/2 degrees of uterine descensus.  Adnexa are palpably normal.  At the time of hysteroscopy patient had a submucous myoma that actually had started to obstruct the internal os even though it was intracavitary preventing good dilatation of the cervix.  Once it was removed the rest of the exploration of the intrauterine cavity failed to reveal any other intrauterine pathology.  Tubal ostia atop of the fundus, anterior and posterior walls of the fundus, lower uterine segment, and cervix were all visualized and normal.  PROCEDURE:                    After adequate general anesthesia the  patient was placed in the dorsal lithotomy position, prepped and draped in the usual sterile manner.  The single tooth tenaculum was placed in the anterior lip of the cervix.  Initially cervical dilatation went well but it became progressively harder, almost as if an obstruction was being hit.  She actually sustained a small cervical laceration at 12 oclock because of it and at this point it was elected to stop the dilatation even though a resectoscope had not been placed and do a diagnostic hysteroscopy.  Diagnostic hysteroscopy was performed using 3% Sorbitol to expand the intrauterine cavity and a camera for magnification and the source of the difficulty dilating the cervix was found to be a submucous myoma from the cavity which was actually hanging down and obstructing the internal os.  At this point then the hysteroscopy was stopped. Using a Randall polyp forceps the myoma could be grasped. It was twisted until the stalk was completely avulsed and the myoma could be removed in one  large specimen.  It was sent to pathology for tissue diagnosis.  The patient then could be further dilated.  A resectoscope was then placed.  The intrauterine cavity could be well visualized.  There was absolutely no other pathology. There was no bleeding from the area where the myoma had been removed so the procedure was terminated.  The anterior cervical laceration was closed with three figure-of-eights of 2-0 Vicryl.  Estimated blood loss for the entire procedure was 200 cc with none replaced.  Patient tolerated the procedure well and left the operating room in satisfactory condition. Dictated by:   Rande Brunt. Eda Paschal, M.D. Attending Physician:  Sharon Mt DD:  04/27/02 TD:  04/29/02 Job: [id number]

## 2011-04-29 NOTE — Assessment & Plan Note (Signed)
[name]                               PULMONARY OFFICE NOTE   NAME:Kramer, Madison GIESELMAN                       MRN:          [id number]  DATE:07/03/2006                            DOB:          30-Sep-1946    PULMONARY/FINAL SUMMARY FOLLOWUP OFFICE VISIT:   HISTORY:  [age]-year-old white female, a remote smoker, who returns  stating she was 100% improved in terms of cough until forgetting to take a  Prilosec this morning and noticed increasing urge to clear her throat, but  no sputum production, fevers, chills, sweats, orthopnea, PND, leg swelling  or significant dyspnea.   PHYSICAL EXAMINATION:  She is a slightly anxious, ambulatory white female in  no acute distress with stable vital signs.  She is clearing her throat  frequently during the interview and exam.  Oropharynx, however, is clear  with no evidence of excessive postnasal drainage or cobblestoning.  Neck  supple without cervical adenopathy or tenderness.  Trachea is midline with  no thyromegaly.  Lung fields are perfectly clear bilaterally to auscultation  and percussion.  There is a regular rate and rhythm without murmur, gallop  or rub present.  Abdomen is soft and benign.  Extremities are warm without  calf tenderness, cyanosis, clubbing or edema.   IMPRESSION:  Adequate control of all of her symptoms while being maintained  on proton pump inhibitor therapy one daily and relapse of her symptoms off  of proton pump inhibitor therapy.  She says it isn't all that bad yet, but  I think by trial and error, we have arrived at an adequate explanation for  her cough, namely low-grade extra-esophageal reflux.  This may have been  exacerbated by taking biphosphonate therapy, and is problematic in this  patient who appears to be at high risk of worsening osteoporosis.   Therefore, I spent extra time with this patient going over several different  scenarios/options, spending a total of 25  minutes in discussion with her  today on the following issues:   1.  Since she has good evidence for extraesophageal reflux, I would      recommend consideration for a single endoscopy to rule out other      complications of chronic reflux such as Barrett's or stricture, but I      will defer this issue to Dr. Sherin Quarry.   1.  It is fine with me if she has reached an acceptable symptoms plateau      to rechallenge with biphosphonate, but I would recommend that she be on      every-month therapy rather than once-a-week therapy and if her symptoms      of throat-clearing and cough go up once this is started, the options are      either to eliminate biphosphonates entirely in favor or Forteo if      warranted, or try to offset the biphosphonates with higher doses of      proton pump inhibitors.   However, at this point, it is clear the patient does not have significant  respiratory problems.  If she does  have breakthrough cough and it worries  her enough to want to take the next step, I would recommend twice-daily  proton pump inhibitor therapy for at least 2 weeks and then proceeding with  methacholine challenge testing to rule out occult asthma, which still  remains in the differential, but most likely, if present, would be driven at  least partially by reflux, and that is  why reflux suppression for at least 2 weeks should occur before the  methacholine challenge test is scheduled.                                   Madison Kramer. Sherene Sires, MD, [facility]   MBW/MedQ  DD:  07/03/2006  DT:  07/04/2006  Job #:  [id number]   cc:   Reuel Boom L. Eda Paschal, MD  Tasia Catchings, MD

## 2011-08-17 ENCOUNTER — Other Ambulatory Visit: Payer: Self-pay

## 2011-08-17 MED ORDER — ZALEPLON 5 MG PO CAPS: 5.0000 mg | ORAL_CAPSULE | Freq: Every day | ORAL | Status: DC

## 2011-09-22 DIAGNOSIS — N904 Leukoplakia of vulva: Secondary | ICD-10-CM | POA: Insufficient documentation

## 2011-10-04 ENCOUNTER — Other Ambulatory Visit (HOSPITAL_COMMUNITY)
Admission: RE | Admit: 2011-10-04 | Discharge: 2011-10-04 | Disposition: A | Discharge status: Home / Self Care | Payer: BC Managed Care – PPO | Source: Ambulatory Visit | Attending: Obstetrics and Gynecology | Admitting: Obstetrics and Gynecology

## 2011-10-04 ENCOUNTER — Ambulatory Visit (INDEPENDENT_AMBULATORY_CARE_PROVIDER_SITE_OTHER): Payer: BC Managed Care – PPO | Admitting: Obstetrics and Gynecology

## 2011-10-04 ENCOUNTER — Encounter: Payer: Self-pay | Admitting: Obstetrics and Gynecology

## 2011-10-04 VITALS — BP 110/64 | Ht 63.5 in | Wt 110.0 lb

## 2011-10-04 DIAGNOSIS — Z01419 Encounter for gynecological examination (general) (routine) without abnormal findings: Secondary | ICD-10-CM

## 2011-10-04 DIAGNOSIS — N952 Postmenopausal atrophic vaginitis: Secondary | ICD-10-CM

## 2011-10-04 DIAGNOSIS — R82998 Other abnormal findings in urine: Secondary | ICD-10-CM

## 2011-10-04 NOTE — Progress Notes (Signed)
Addended byCammie Mcgee T on: 10/04/2011 03:01 PM   Modules accepted: Orders

## 2011-10-04 NOTE — Progress Notes (Signed)
Patient came to see me today for her annual GYN exam. She is up-to-date on mammograms. She is probably due for a bone density as the last report I have was in 2009. She is using estradiol vaginal cream 0.02% for atrophic vaginitis but isn't sure it's working as well as the Estrace cream. She is however only using half an applicator 3 times a week. She is having no vaginal bleeding. She is having no pelvic pain.  ROS: 12 system review done: Pertinent positives include hypertension, GERD, nosebleeds, and left T10 rib fracture.  Physical examination: Kennon Portela present. HEENT within normal limits. Neck: Thyroid not large. No masses. Supraclavicular nodes: not enlarged. Breasts: Examined in both sitting midline position. No skin changes and no masses. Abdomen: Soft no guarding rebound or masses or hernia. 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 BUS: Within normal limits. Vaginal:within normal limits. Fair  estrogen effect. No evidence of cystocele rectocele or enterocele. Cervix: clean. Uterus: Normal size and shape. Adnexa: No masses. Rectovaginal exam: Confirmatory and negative. Extremities: Within normal limits.  Assessment: #1. 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 Osteopenia  Plan: Continue estradiol vaginal cream 0.02% but increase dose to a full applicator 3 times a week. Continued observation of LS & A. Get me most recent bone density. If last bone density in 2009 do a followup bone density.

## 2011-10-04 NOTE — Patient Instructions (Addendum)
Please get me a copy of any bone densitys done after 2009.

## 2011-10-07 ENCOUNTER — Encounter: Payer: Self-pay | Admitting: Obstetrics and Gynecology

## 2011-10-08 NOTE — Progress Notes (Signed)
I received patient's last bone density from one year ago and reviewed it. Her worst T score was -2.4 with greater than 4% bone loss in both spine and hip from 2009 which is statistically  significant. Her FRAX risk was not elevated but she was previously on biphosphonates.  It also did not consider her fracture of her rib which possibly was a  Fragility fracture. We discussed this today and she is going to see if she could be approved by insurance for a one year bone density follow up.

## 2011-11-14 ENCOUNTER — Telehealth: Payer: Self-pay

## 2011-11-14 MED ORDER — ESTRADIOL 0.1 MG/GM VA CREA: 2.0000 g | TOPICAL_CREAM | Freq: Every day | VAGINAL | Status: DC

## 2011-11-14 NOTE — Telephone Encounter (Signed)
Pt. DOES NOT LIKE GENERIC ESTROGEN COMPOUND RX. IT DOES NOT HELP HER VAGINAL SYMPTOMS 100%. WANTS TO USE ESTRACE VAGINAL CREAM. WE HAVE SAMPLES I CAN GIVE HER.

## 2011-11-14 NOTE — Telephone Encounter (Signed)
PT. NOTIFIED TO PICK UP SAMPLES AND AI REVIEWED WITH HER HOW & WHAT DOSES  DR. Reece Agar WANTED HER TO USE PREVIOUSLY.

## 2011-11-14 NOTE — Telephone Encounter (Signed)
Okay to give her samples 

## 2011-12-16 ENCOUNTER — Encounter: Payer: Self-pay | Admitting: Obstetrics and Gynecology

## 2011-12-20 DIAGNOSIS — J309 Allergic rhinitis, unspecified: Secondary | ICD-10-CM | POA: Diagnosis not present

## 2012-01-02 DIAGNOSIS — J309 Allergic rhinitis, unspecified: Secondary | ICD-10-CM | POA: Diagnosis not present

## 2012-01-11 DIAGNOSIS — J309 Allergic rhinitis, unspecified: Secondary | ICD-10-CM | POA: Diagnosis not present

## 2012-01-20 DIAGNOSIS — J309 Allergic rhinitis, unspecified: Secondary | ICD-10-CM | POA: Diagnosis not present

## 2012-01-30 DIAGNOSIS — J309 Allergic rhinitis, unspecified: Secondary | ICD-10-CM | POA: Diagnosis not present

## 2012-02-03 ENCOUNTER — Encounter: Payer: Self-pay | Admitting: Obstetrics and Gynecology

## 2012-02-03 DIAGNOSIS — Z1231 Encounter for screening mammogram for malignant neoplasm of breast: Secondary | ICD-10-CM | POA: Diagnosis not present

## 2012-02-10 DIAGNOSIS — J309 Allergic rhinitis, unspecified: Secondary | ICD-10-CM | POA: Diagnosis not present

## 2012-02-17 ENCOUNTER — Other Ambulatory Visit: Payer: Self-pay

## 2012-02-17 DIAGNOSIS — J309 Allergic rhinitis, unspecified: Secondary | ICD-10-CM | POA: Diagnosis not present

## 2012-02-17 MED ORDER — ZALEPLON 5 MG PO CAPS: 5.0000 mg | ORAL_CAPSULE | Freq: Every day | ORAL | Status: DC

## 2012-02-17 NOTE — Telephone Encounter (Signed)
rx phoned to pharmacy

## 2012-02-27 DIAGNOSIS — J309 Allergic rhinitis, unspecified: Secondary | ICD-10-CM | POA: Diagnosis not present

## 2012-03-12 DIAGNOSIS — J309 Allergic rhinitis, unspecified: Secondary | ICD-10-CM | POA: Diagnosis not present

## 2012-03-19 DIAGNOSIS — I10 Essential (primary) hypertension: Secondary | ICD-10-CM | POA: Diagnosis not present

## 2012-03-19 DIAGNOSIS — E782 Mixed hyperlipidemia: Secondary | ICD-10-CM | POA: Diagnosis not present

## 2012-03-22 DIAGNOSIS — J309 Allergic rhinitis, unspecified: Secondary | ICD-10-CM | POA: Diagnosis not present

## 2012-03-28 DIAGNOSIS — J309 Allergic rhinitis, unspecified: Secondary | ICD-10-CM | POA: Diagnosis not present

## 2012-04-02 DIAGNOSIS — J309 Allergic rhinitis, unspecified: Secondary | ICD-10-CM | POA: Diagnosis not present

## 2012-04-12 DIAGNOSIS — J309 Allergic rhinitis, unspecified: Secondary | ICD-10-CM | POA: Diagnosis not present

## 2012-04-17 DIAGNOSIS — J309 Allergic rhinitis, unspecified: Secondary | ICD-10-CM | POA: Diagnosis not present

## 2012-04-23 DIAGNOSIS — J309 Allergic rhinitis, unspecified: Secondary | ICD-10-CM | POA: Diagnosis not present

## 2012-05-01 DIAGNOSIS — J45909 Unspecified asthma, uncomplicated: Secondary | ICD-10-CM | POA: Diagnosis not present

## 2012-05-01 DIAGNOSIS — J3089 Other allergic rhinitis: Secondary | ICD-10-CM | POA: Diagnosis not present

## 2012-05-01 DIAGNOSIS — J157 Pneumonia due to Mycoplasma pneumoniae: Secondary | ICD-10-CM | POA: Diagnosis not present

## 2012-05-01 DIAGNOSIS — H1045 Other chronic allergic conjunctivitis: Secondary | ICD-10-CM | POA: Diagnosis not present

## 2012-05-04 ENCOUNTER — Telehealth: Payer: Self-pay | Admitting: *Deleted

## 2012-05-04 MED ORDER — ESTRADIOL 0.1 MG/GM VA CREA: 2.0000 g | TOPICAL_CREAM | Freq: Every day | VAGINAL | Status: DC

## 2012-05-04 NOTE — Telephone Encounter (Signed)
Pt called requesting refill on estrace vaginal cream 0.01% cream rx sent to pharmacy

## 2012-05-08 ENCOUNTER — Telehealth: Payer: Self-pay | Admitting: *Deleted

## 2012-05-08 DIAGNOSIS — J309 Allergic rhinitis, unspecified: Secondary | ICD-10-CM | POA: Diagnosis not present

## 2012-05-08 NOTE — Telephone Encounter (Signed)
Pt calling requesting estrace 0.1% cream, left up front for pick up.

## 2012-05-16 DIAGNOSIS — J309 Allergic rhinitis, unspecified: Secondary | ICD-10-CM | POA: Diagnosis not present

## 2012-05-21 DIAGNOSIS — M2550 Pain in unspecified joint: Secondary | ICD-10-CM | POA: Diagnosis not present

## 2012-05-29 DIAGNOSIS — J309 Allergic rhinitis, unspecified: Secondary | ICD-10-CM | POA: Diagnosis not present

## 2012-06-11 DIAGNOSIS — J3089 Other allergic rhinitis: Secondary | ICD-10-CM | POA: Diagnosis not present

## 2012-06-11 DIAGNOSIS — J45909 Unspecified asthma, uncomplicated: Secondary | ICD-10-CM | POA: Diagnosis not present

## 2012-06-11 DIAGNOSIS — H1045 Other chronic allergic conjunctivitis: Secondary | ICD-10-CM | POA: Diagnosis not present

## 2012-06-11 DIAGNOSIS — J45901 Unspecified asthma with (acute) exacerbation: Secondary | ICD-10-CM | POA: Diagnosis not present

## 2012-06-15 ENCOUNTER — Encounter: Payer: Self-pay | Admitting: Internal Medicine

## 2012-06-15 ENCOUNTER — Telehealth: Payer: Self-pay | Admitting: Internal Medicine

## 2012-06-15 ENCOUNTER — Ambulatory Visit (INDEPENDENT_AMBULATORY_CARE_PROVIDER_SITE_OTHER): Payer: Medicare Other | Admitting: Internal Medicine

## 2012-06-15 ENCOUNTER — Ambulatory Visit (INDEPENDENT_AMBULATORY_CARE_PROVIDER_SITE_OTHER)
Admission: RE | Admit: 2012-06-15 | Discharge: 2012-06-15 | Disposition: A | Discharge status: Home / Self Care | Payer: Medicare Other | Source: Ambulatory Visit | Attending: Internal Medicine | Admitting: Internal Medicine

## 2012-06-15 VITALS — BP 112/70 | HR 63 | Temp 97.0°F | Ht 63.5 in | Wt 112.0 lb

## 2012-06-15 DIAGNOSIS — R05 Cough: Secondary | ICD-10-CM

## 2012-06-15 DIAGNOSIS — J45909 Unspecified asthma, uncomplicated: Secondary | ICD-10-CM

## 2012-06-15 DIAGNOSIS — J8289 Other pulmonary eosinophilia, not elsewhere classified: Secondary | ICD-10-CM | POA: Diagnosis not present

## 2012-06-15 DIAGNOSIS — R059 Cough, unspecified: Secondary | ICD-10-CM | POA: Diagnosis not present

## 2012-06-15 MED ORDER — DEXLANSOPRAZOLE 60 MG PO CPDR: 60.0000 mg | DELAYED_RELEASE_CAPSULE | Freq: Every day | ORAL | Status: DC

## 2012-06-15 MED ORDER — FAMOTIDINE 20 MG PO TABS: ORAL_TABLET | ORAL | Status: DC

## 2012-06-15 NOTE — Telephone Encounter (Signed)
PT IS ALSO WANTING TO GET A CHEST XRAY CAUSE [place] IF [place] PNEMONIA

## 2012-06-15 NOTE — Progress Notes (Signed)
Subjective:     Patient ID: BENNETTA RUDDEN, female   DOB: [birth year]  MRN: [id number]  HPI  89 yowf quit smoking 1991 with recurrent tickle in throat since 1997 better on ppi when she remembers to take it with chronic scarring over rml/ lingula on cxr.   March 19, 2010 cc "mucus thing" typically after breakfast x years "always" , no better since started allergy shots March 40981, not consistently using any GERD Rx because "doesn't have reflux".  Rec two times a day ppi and hs h2 but unable to tolerate bid ppi so just took it once and didn't seem to help.  June 21, 2010 Acute visit. Pt c/o chest tightness and cough x 1 wk. She states that cough is mainly dry-sometimes prod with minimal sputum. She also c/o fatigue. better 30 min after uses ventolin but rarely feels the need for it.  rec Zyrtec stop  Prednsione 10 mg 4 each am x 2days, 2x2days, 1x2days and stop  Chlortrimeton 4 mg one ever 6h as needed for drainage  Ventolin 2 puffs every 4 hours if chest tightness but should not need this if we address the underlying problem effectively  Dexilant 60 mg before bfast and pepcid 20 mg at bedtime   July 07, 2010 2 wk followup. Pt states that the chest tightness and cough have pretty much resolved. No complaints today. much better on dexilant and pepcid at bedtime. rec taper off p 3 months to see if symptoms recur  06/15/12 ov/ Alyn Riedinger acute w/in ov cc  c/o wheezing and cough x 1 wk- occ produces thick, yellow sputum. She also c/o occ SOB.  Already on qvar, zpak, prednisone.  Only relief she got was one good night of sleep p taking xanax 0.25 - otherwise constant coughing, generalized but L > R ant cp with coughing. Has saba but hasn't used it since changing to qvar.  No obvious daytime variabilty or assoc sinus or hb symptoms. No unusual exp hx   ROS  The following are not active complaints unless bolded sore throat, dysphagia, dental problems, itching, sneezing,  nasal congestion or excess/ purulent  secretions, ear ache,   fever, chills, sweats, unintended wt loss,   exertional cp, hemoptysis,  orthopnea pnd or leg swelling, presyncope, palpitations, heartburn, abdominal pain, anorexia, nausea, vomiting, diarrhea  or change in bowel or urinary habits, change in stools or urine, dysuria,hematuria,  rash, arthralgias, visual complaints, headache, numbness weakness or ataxia or problems with walking or coordination,  change in mood/affect or memory.            Past Medical History:  Hyperlipidemia  Hypertension  Chronic rhinitis............................................................Marland KitchenDr Madie Reno - neg sinus ct 04/12/06  - started allergy shots started March 2010  RML/ Lingular scarring  - Cxr without change October 27, 2009  - CXR repeat March 19, 2010 and 06/15/12 no change    Review of Systems     Objective:   Physical Exam  wt 119 December 22, 2008 > 121 December 30, 2008 > 117 March 19, 2010 > 115 June 21, 2010 > 115 July 07, 2010 > 112 06/15/2012  Ambulatory healthy but anxious wf in no acute distress with classic pseudowheeze.  HEENT: nl dentition and orophanx. Nl external ear canals without cough reflex, mod nonspecific turbinate edema, mucoid secretions. no excess pnd  Neck without JVD/Nodes/TM  Lungs clear to A & P no cough on insp/exp  RRR no s3 or murmur or increase in P2  Abd soft and  benign with nl excursion in the supine position. No bruits or organomegaly  Ext warm without calf tenderness, cyanosis clubbing or edema  Skin warm and dry without lesions    CXR  06/16/2012 : No active lung disease. Mild peribronchial thickening. .      Assessment:          Plan:

## 2012-06-15 NOTE — Patient Instructions (Addendum)
Dulera 100 2 puffs every 12 hours if needed for breathing or coughing and hold your qvar for now  Work on inhaler technique:  relax and gently blow all the way out then take a nice smooth deep breath back in, triggering the inhaler at same time you start breathing in.  Hold for up to 5 seconds if you can.  Rinse and gargle with water when done   If your mouth or throat starts to bother you,   I suggest you time the inhaler to your dental care and after using the inhaler(s) brush teeth and tongue with a baking soda containing toothpaste and when you rinse this out, gargle with it first to see if this helps your mouth and throat.     Restart dexilant 60 mg Take 30-60 min before first meal of the day and Pepcid 20 mg one at bedtime   Take delsym two tsp every 12 hours  For cough   Finish up your prednisone and zpack  Please remember to go to the  x-ray department downstairs for your tests - we will call you with the results when they are available.     Please schedule a follow up office visit in 4 weeks, sooner if needed

## 2012-06-15 NOTE — Telephone Encounter (Signed)
Called, spoke with pt who c/o cough.  States cough was prod several days ago with thick, discolored mucus but is now nonprod.  Also, reports she does have left side chest pain but only when coughing.  Denies increased SOB, thinks she may have some wheezing.  She was last seen by Dr. Sherene Sires 06/2010.  We have scheduled her to see him today, June 15, 2012 at 3:45 pm -- pt aware.

## 2012-06-15 NOTE — Progress Notes (Deleted)
sdf

## 2012-06-16 DIAGNOSIS — J45909 Unspecified asthma, uncomplicated: Secondary | ICD-10-CM | POA: Insufficient documentation

## 2012-06-16 NOTE — Assessment & Plan Note (Signed)
Symptoms are markedly disproportionate to objective findings and not clear this is a lung problem (note how much better she was p xanax) but pt does appear to have difficult airway management issues. DDX of  difficult airways managment all start with A and  include Adherence, Ace Inhibitors, Acid Reflux, Active Sinus Disease, Alpha 1 Antitripsin deficiency, Anxiety masquerading as Airways dz,  ABPA,  allergy(esp in young), Aspiration (esp in elderly), Adverse effects of DPI,  Active smokers, plus two Bs  = Bronchiectasis and Beta blocker use..and one C= CHF   Adherence is always the initial "prime suspect" and is a multilayered concern that requires a "trust but verify" approach in every patient - starting with knowing how to use medications, especially inhalers, correctly, keeping up with refills and understanding the fundamental difference between maintenance and prns vs those medications only taken for a very short course and then stopped and not refilled. The proper method of use, as well as anticipated side effects, of a metered-dose inhaler are discussed and demonstrated to the patient. Improved effectiveness after extensive coaching during this visit to a level of approximately  75%.  Acid reflux was the culprit for her previous refractory symptoms so need to start by completely eliminating this from the list of usual suspects.  Not clear how much allergy/asthma actually contributing to her intermittent symptoms but not responding to qvar and prednisone at all so far so try just use dulera 100 2 every 12 hours if having symptoms and stop it once symptoms, which may be due to gerd again, resolve again on rx.  ?  Active Sinus dz or bronchiectasis > scans of chest and sinus next if symptoms remain refractory.  ? Anxiety > usually a dx of exclusion but since already responded to xanax ok to take prn for now

## 2012-06-16 NOTE — Assessment & Plan Note (Signed)
The most common causes of chronic cough in immunocompetent adults include the following: upper airway cough syndrome (UACS), previously referred to as postnasal drip syndrome (PNDS), which is caused by variety of rhinosinus conditions; (2) asthma; (3) GERD; (4) chronic bronchitis from cigarette smoking or other inhaled environmental irritants; (5) nonasthmatic eosinophilic bronchitis; and (6) bronchiectasis.   These conditions, singly or in combination, have accounted for up to 94% of the causes of chronic cough in prospective studies.   Other conditions have constituted no >6% of the causes in prospective studies These have included bronchogenic carcinoma, chronic interstitial pneumonia, sarcoidosis, left ventricular failure, ACEI-induced cough, and aspiration from a condition associated with pharyngeal dysfunction.   This is most c/w  Classic Upper airway cough syndrome, so named because it's frequently impossible to sort out how much is  CR/sinusitis with freq throat clearing (which can be related to primary GERD)   vs  causing  secondary (" extra esophageal")  GERD from wide swings in gastric pressure that occur with throat clearing, often  promoting self use of mint and menthol lozenges that reduce the lower esophageal sphincter tone and exacerbate the problem further in a cyclical fashion.   These are the same pts (now being labeled as having "irritable larynx syndrome" by some cough centers) who not infrequently have a history of having failed to tolerate ace inhibitors,  dry powder inhalers or biphosphonates or report having atypical reflux symptoms that don't respond to standard doses of PPI , and are easily confused as having aecopd or asthma flares by even experienced allergists/ pulmonologists.   Based on previous refractory symptoms that finally responded to max gerd rx, rec resume ppi ac and h2 hs and then regroup if not better.

## 2012-06-18 NOTE — Progress Notes (Signed)
Quick Note:  Called and spoke with patient about cxr results. Patient informed me that she was already aware of results and had no further questions or concerns at this time. ______

## 2012-06-25 DIAGNOSIS — J309 Allergic rhinitis, unspecified: Secondary | ICD-10-CM | POA: Diagnosis not present

## 2012-07-05 DIAGNOSIS — J309 Allergic rhinitis, unspecified: Secondary | ICD-10-CM | POA: Diagnosis not present

## 2012-07-09 DIAGNOSIS — J309 Allergic rhinitis, unspecified: Secondary | ICD-10-CM | POA: Diagnosis not present

## 2012-07-13 ENCOUNTER — Encounter: Payer: Self-pay | Admitting: Internal Medicine

## 2012-07-13 ENCOUNTER — Ambulatory Visit (INDEPENDENT_AMBULATORY_CARE_PROVIDER_SITE_OTHER): Payer: Medicare Other | Admitting: Internal Medicine

## 2012-07-13 VITALS — BP 110/78 | HR 87 | Temp 97.4°F | Ht 64.0 in | Wt 113.0 lb

## 2012-07-13 DIAGNOSIS — J45909 Unspecified asthma, uncomplicated: Secondary | ICD-10-CM

## 2012-07-13 DIAGNOSIS — J309 Allergic rhinitis, unspecified: Secondary | ICD-10-CM | POA: Diagnosis not present

## 2012-07-13 DIAGNOSIS — R05 Cough: Secondary | ICD-10-CM

## 2012-07-13 DIAGNOSIS — R059 Cough, unspecified: Secondary | ICD-10-CM | POA: Diagnosis not present

## 2012-07-13 NOTE — Patient Instructions (Addendum)
Work on inhaler technique:  relax and gently blow all the way out then take a nice smooth deep breath back in, triggering the inhaler at same time you start breathing in.  Hold for up to 5 seconds if you can.  Rinse and gargle with water when done   If your mouth or throat starts to bother you,   I suggest you time the inhaler to your dental care and after using the inhaler(s) brush teeth and tongue with a baking soda containing toothpaste and when you rinse this out, gargle with it first to see if this helps your mouth and throat.     Try prilosec 20mg   Take 30-60 min before first meal of the day and Pepcid 20 mg one bedtime until all resp symptoms are completely gone for at least a week without the need for cough suppression  I think of reflux for chronic cough like I do oxygen for fire (doesn't cause the fire but once you get the oxygen suppressed it usually goes away regardless of the exact cause)   Continue qvar 80 Take 2 puffs first thing in am and then another 2 puffs about 12 hours later perfectly regularly   If you are satisfied with your treatment plan let your doctor know and he/she can either refill your medications or you can return here when your prescription runs out.     If in any way you are not 100% satisfied,  please tell us.  If 100% better, tell your friends!

## 2012-07-13 NOTE — Progress Notes (Signed)
Subjective:     Patient ID: Madison Kramer, female   DOB: 1946/08/11  MRN: [id number]  HPI  50 yowf quit smoking 1991 with recurrent tickle in throat since 1997 better on ppi when she remembers to take it with chronic scarring over rml/ lingula on cxr.   March 19, 2010 cc "mucus thing" typically after breakfast x years "always" , no better since started allergy shots March 40347, not consistently using any GERD Rx because "doesn't have reflux".  Rec two times a day ppi and hs h2 but unable to tolerate bid ppi so just took it once and didn't seem to help.  June 21, 2010 Acute visit. Pt c/o chest tightness and cough x 1 wk. She states that cough is mainly dry-sometimes prod with minimal sputum. She also c/o fatigue. better 30 min after uses ventolin but rarely feels the need for it.  rec Zyrtec stop  Prednsione 10 mg 4 each am x 2days, 2x2days, 1x2days and stop  Chlortrimeton 4 mg one ever 6h as needed for drainage  Ventolin 2 puffs every 4 hours if chest tightness but should not need this if we address the underlying problem effectively  Dexilant 60 mg before bfast and pepcid 20 mg at bedtime   July 07, 2010 2 wk followup. Pt states that the chest tightness and cough have pretty much resolved. No complaints today. much better on dexilant and pepcid at bedtime. rec taper off p 3 months to see if symptoms recur  06/15/12 ov/ Faithanne Verret acute w/in ov cc  c/o wheezing and cough x 1 wk- occ produces thick, yellow sputum. She also c/o occ SOB.  Already on qvar, zpak, prednisone.  Only relief she got was one good night of sleep p taking xanax 0.25 - otherwise constant coughing, generalized but L > R ant cp with coughing. Has saba but hasn't used it since changing to qvar.  No obvious daytime variabilty or assoc sinus or hb symptoms. No unusual exp hx  rec Dulera 100 2 puffs every 12 hours if needed for breathing or coughing and hold your qvar for now Work on inhaler technique:     Restart dexilant 60 mg Take  30-60 min before first meal of the day and Pepcid 20 mg one at bedtime  Take delsym two tsp every 12 hours  For cough  Finish up your prednisone and zpack       07/13/2012 f/u ov/Cecilee Rosner cc cough much better but didn't start dulera due to concerns about ICS - note symptoms recurred previously while maintained on   qvar (albeit with poor hfa and ? Secondary gerd) and required a course of prednisone.  No limiting sob. No purulent sputum, no variability to symptoms at present  Sleeping ok without nocturnal  or early am exacerbation  of respiratory  c/o's or need for noct saba. Also denies any obvious fluctuation of symptoms with weather or environmental changes or other aggravating or alleviating factors except as outlined above   ROS  The following are not active complaints unless bolded sore throat, dysphagia, dental problems, itching, sneezing,  nasal congestion or excess/ purulent secretions, ear ache,   fever, chills, sweats, unintended wt loss,   exertional cp, hemoptysis,  orthopnea pnd or leg swelling, presyncope, palpitations, heartburn, abdominal pain, anorexia, nausea, vomiting, diarrhea  or change in bowel or urinary habits, change in stools or urine, dysuria,hematuria,  rash, arthralgias, visual complaints, headache, numbness weakness or ataxia or problems with walking or coordination,  change in  mood/affect or memory.            Past Medical History:  Hyperlipidemia  Hypertension  Chronic rhinitis............................................................Marland KitchenDr Madie Reno - neg sinus ct 04/12/06  - started allergy shots started March 2010  RML/ Lingular scarring  - Cxr without change October 27, 2009  - CXR repeat March 19, 2010 and 06/15/12 no change         Objective:   Physical Exam amb anxious wf nad with classic pseudoweeze pattern  wt 119 December 22, 2008 > 121 December 30, 2008 > 117 March 19, 2010 > 115 June 21, 2010 > 115 July 07, 2010 > 112 06/15/2012 > 07/13/2012  113    Ambulatory healthy but anxious wf in no acute distress with classic pseudowheeze.  HEENT: nl dentition and orophanx. Nl external ear canals without cough reflex, mod nonspecific turbinate edema, mucoid secretions. no excess pnd  Neck without JVD/Nodes/TM  Lungs clear to A & P no cough on insp/exp on purse lip maneuver RRR no s3 or murmur or increase in P2  Abd soft and benign with nl excursion in the supine position. No bruits or organomegaly  Ext warm without calf tenderness, cyanosis clubbing or edema  Skin warm and dry without lesions    CXR  06/16/2012 : No active lung disease. Mild peribronchial thickening  Spirometry 07/13/2012 wnl. .      Assessment:          Plan:

## 2012-07-13 NOTE — Assessment & Plan Note (Signed)
-   HFA 75% 07/13/2012   - Spirometry 07/13/2012 >  wnl  At this point not clear whether gerd is a primary or a secondary problem but since has longstanding hx of asthma will just ask her to start aggressive gerd rx when flaring and maintain on qvar 80 bid.  If flares through both and needs more prednisone cycles, though, she's clearly not making an informed decision when she chooses qvar over dulera to reduce steroid exposure.   The proper method of use, as well as anticipated side effects, of a metered-dose inhaler are discussed and demonstrated to the patient. Improved effectiveness after extensive coaching during this visit to a level of approximately  75%

## 2012-07-16 DIAGNOSIS — J309 Allergic rhinitis, unspecified: Secondary | ICD-10-CM | POA: Diagnosis not present

## 2012-07-17 DIAGNOSIS — L821 Other seborrheic keratosis: Secondary | ICD-10-CM | POA: Diagnosis not present

## 2012-07-17 DIAGNOSIS — D239 Other benign neoplasm of skin, unspecified: Secondary | ICD-10-CM | POA: Diagnosis not present

## 2012-07-17 DIAGNOSIS — S93609A Unspecified sprain of unspecified foot, initial encounter: Secondary | ICD-10-CM | POA: Diagnosis not present

## 2012-07-19 DIAGNOSIS — J309 Allergic rhinitis, unspecified: Secondary | ICD-10-CM | POA: Diagnosis not present

## 2012-07-27 DIAGNOSIS — J309 Allergic rhinitis, unspecified: Secondary | ICD-10-CM | POA: Diagnosis not present

## 2012-08-01 DIAGNOSIS — J309 Allergic rhinitis, unspecified: Secondary | ICD-10-CM | POA: Diagnosis not present

## 2012-08-07 DIAGNOSIS — J309 Allergic rhinitis, unspecified: Secondary | ICD-10-CM | POA: Diagnosis not present

## 2012-08-15 ENCOUNTER — Encounter (HOSPITAL_COMMUNITY): Payer: Self-pay | Admitting: *Deleted

## 2012-08-15 ENCOUNTER — Emergency Department (INDEPENDENT_AMBULATORY_CARE_PROVIDER_SITE_OTHER)
Admission: EM | Admit: 2012-08-15 | Discharge: 2012-08-15 | Disposition: A | Discharge status: Home / Self Care | Payer: Medicare Other | Source: Home / Self Care | Attending: Emergency Medicine | Admitting: Emergency Medicine

## 2012-08-15 DIAGNOSIS — R209 Unspecified disturbances of skin sensation: Secondary | ICD-10-CM | POA: Diagnosis not present

## 2012-08-15 DIAGNOSIS — R21 Rash and other nonspecific skin eruption: Secondary | ICD-10-CM | POA: Diagnosis not present

## 2012-08-15 DIAGNOSIS — R202 Paresthesia of skin: Secondary | ICD-10-CM

## 2012-08-15 MED ORDER — ACYCLOVIR 400 MG PO TABS: 400.0000 mg | ORAL_TABLET | Freq: Three times a day (TID) | ORAL | Status: AC

## 2012-08-15 NOTE — ED Notes (Signed)
Pt  Reports  Symptoms   Of       A  Rash  Around mouth        Some  Sensation of  Tingling  Face         No  Slurred  Speech         Hand grips  Strong   Ambulates   With a  Steady  Fluid  Gate      PEARLA       -      She           Reports   Symptoms          She  Noticed      This  This    Am  When  She  Woke up  About 6  Hours  Ago     -  She  Reports  She  Ate  Shellfish  Last pm  And   In the  Distant  Past  She  Had  A  Skin reaction to    Them  However    She  Has  Eaten  Shellfish since

## 2012-08-15 NOTE — Discharge Instructions (Signed)
We discuss several things among them what neurological symptoms will be of great concern. Such as muscular weakness, speech problems, severe headache, visual changes etc. that will require further evaluation in the emergency department. Your current symptoms and exam was more consistent with a coexistent perioral rash along with some facial paresthesias. I have advised you to be alert for signs of facial muscles are paralysis or weakness as illustrated during your exam. In the meantime I think is reasonable to start you on this medicine the acyclovir, as discussed.

## 2012-08-15 NOTE — ED Provider Notes (Addendum)
History     CSN: [id number]  Arrival date & time 08/15/12  1231   First MD Initiated Contact with Patient 08/15/12 1235      Chief Complaint  Patient presents with  . Rash    (Consider location/radiation/quality/duration/timing/severity/associated sxs/prior treatment) HPI Comments: Patient presents this early afternoon to urgent care complaining of a rash around her mouth that first appear early this morning. (She points the left side of her face), simultaneously she's been feeling a bit of a tingling sensation on the left side of her face and also on the upper corner of her right eye which she describes clearly that about a week ago she was stung by an insect in that area and redness and swelling have subsided since then. Patient when asked denies any muscular weakness to any of her upper or lower extremities or any numbness or tingling sensation to any of her extremities. She doesn't describe any speech problems headaches, or disequilibrium. Denies any fevers nausea or systemic symptoms such as generalized malaise,myalgias, arthralgias or changes in appetite.  Patient is a 67 y.o. female presenting with rash. The history is provided by the patient.  Rash  This is a new problem. The current episode started 12 to 24 hours ago. The problem has not changed since onset.There has been no fever. The fever has been present for less than 1 day. The rash is present on the face. The patient is experiencing no pain. Associated symptoms include itching. Pertinent negatives include no pain and no weeping. She has tried nothing for the symptoms. The treatment provided no relief.    Past Medical History  Diagnosis Date  . Fibroid     SUBMUCOUS MYOMA  . Lichen sclerosus et atrophicus of the vulva   . Osteopenia   . Atrophic vaginitis   . Elevated cholesterol   . Hypertension     Past Surgical History  Procedure Date  . Breast surgery 2001    REDUCTION MAMMAPLASTY  . Tubal ligation 1987  .  Inguinal hernia repair 1971    BILATERAL  . Hysteroscopy 2003    HYSTEROSCOPIC RESECTION OF MYOMA  . Foot surgery 2009    Family History  Problem Relation Age of Onset  . Heart disease Mother   . Hypertension Mother   . Multiple myeloma Father   . Breast cancer Maternal Aunt   . Heart disease Maternal Uncle     History  Substance Use Topics  . Smoking status: Former Smoker -- 20 years    Types: Cigarettes    Quit date: 12/12/1988  . Smokeless tobacco: Never Used   Comment: "random smoker"  . Alcohol Use: 5.0 oz/week    10 drink(s) per week    OB History    Grav Para Term Preterm Abortions TAB SAB Ect Mult Living   2 2        2       Review of Systems  Constitutional: Negative for fever, chills, activity change and fatigue.  HENT: Negative for hearing loss, ear pain, facial swelling, trouble swallowing, neck pain and neck stiffness.   Eyes: Negative for pain, redness, itching and visual disturbance.  Musculoskeletal: Negative for arthralgias.  Skin: Positive for itching and rash. Negative for wound.  Neurological: Positive for numbness. Negative for dizziness, tremors, seizures, syncope, facial asymmetry, speech difficulty and weakness.    Allergies  Codeine; Erythromycin; and Sulfonamide derivatives  Home Medications   Current Outpatient Rx  Name Route Sig Dispense Refill  . ACYCLOVIR 400  MG PO TABS Oral Take 1 tablet (400 mg total) by mouth 3 (three) times daily. 15 tablet 0  . ASPIRIN 81 MG PO TABS Oral Take 81 mg by mouth every 3 (three) days.     Marland Kitchen CALCIUM + D PO Oral Take 1 tablet by mouth daily.     . DEXLANSOPRAZOLE 60 MG PO CPDR Oral Take 1 capsule (60 mg total) by mouth daily. 30 capsule 0  . ESTRADIOL 0.1 MG/GM VA CREA Vaginal Place 0.25 Applicatorfuls vaginally daily. 42.5 g 5  . FAMOTIDINE 20 MG PO TABS  One at bedtime 30 tablet 11  . FIBER COMPLETE PO Oral Take 1 tablet by mouth daily.    Marland Kitchen LOSARTAN POTASSIUM 50 MG PO TABS Oral Take 50 mg by mouth  daily.    . TUMERSAID PO Oral Take 1 tablet by mouth daily.    . MULTIVITAMINS PO CAPS Oral Take 1 capsule by mouth daily.      Marland Kitchen PROBIOTIC DAILY PO Oral Take 1 tablet by mouth daily.    Marland Kitchen SIMVASTATIN 40 MG PO TABS Oral Take 60 mg by mouth at bedtime. Taking 60 mg    . ZALEPLON 5 MG PO CAPS Oral Take 5 mg by mouth as needed.      Marland Kitchen ZALEPLON 5 MG PO CAPS Oral Take 1 capsule (5 mg total) by mouth at bedtime. 30 capsule 2    BP 168/90  Pulse 70  Temp 97.5 F (36.4 C) (Oral)  Resp 24  SpO2 100%  Physical Exam  Nursing note and vitals reviewed. Constitutional: She is oriented to person, place, and time. She appears well-developed and well-nourished.  HENT:  Head: Normocephalic and atraumatic.  Eyes: Conjunctivae and EOM are normal. Pupils are equal, round, and reactive to light. No scleral icterus.  Neck: Neck supple.  Lymphadenopathy:    She has no cervical adenopathy.  Neurological: She is alert and oriented to person, place, and time. She has normal reflexes. She displays no atrophy, no tremor and normal reflexes. A sensory deficit is present. No cranial nerve deficit. She exhibits normal muscle tone. She displays no seizure activity. Coordination and gait normal. GCS eye subscore is 4. GCS verbal subscore is 5. GCS motor subscore is 6.       Patient had a bit of difficulty discriminating 2. discrimination on the left side of her face at about 10 mm of difference or distance. Patient exhibited no facial muscle paralysis with different maneuvers. Elevation of her eyebrow, smiling, puffing reveal no deficits. Muscular strength of both upper and lower extremities was 5 out of 5.  Skin: Rash noted. There is erythema.    ED Course  Procedures (including critical care time)  Labs Reviewed - No data to display No results found.   1. Facial rash   2. Paresthesias      Patient verbally agreed to have picture taken for documentation purposes. MDM  Unilateral perioral papular  erythematous eruption with coexistent facial paresthesias. Patient has no signs of facial paralysis, muscular strength of both upper and lower extremities and facial muscles were unremarkable on thorough neurological exam. No ocular manifestations or symptomatology. Patient has been immunized Zoster. 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 Have advised patient about neurological symptoms that should warrant further evaluation. She agrees with treatment plan and follow-up care as necessary. Have instructed her with specific symptoms to look for in terms of early detection of any type  of facial paralysis.        Jimmie Molly, MD 08/15/12 1353  Jimmie Molly, MD 08/15/12 [phone]

## 2012-08-16 DIAGNOSIS — J309 Allergic rhinitis, unspecified: Secondary | ICD-10-CM | POA: Diagnosis not present

## 2012-08-17 DIAGNOSIS — R233 Spontaneous ecchymoses: Secondary | ICD-10-CM | POA: Diagnosis not present

## 2012-08-17 DIAGNOSIS — R209 Unspecified disturbances of skin sensation: Secondary | ICD-10-CM | POA: Diagnosis not present

## 2012-08-20 DIAGNOSIS — J3089 Other allergic rhinitis: Secondary | ICD-10-CM | POA: Diagnosis not present

## 2012-08-20 DIAGNOSIS — J45909 Unspecified asthma, uncomplicated: Secondary | ICD-10-CM | POA: Diagnosis not present

## 2012-08-20 DIAGNOSIS — J309 Allergic rhinitis, unspecified: Secondary | ICD-10-CM | POA: Diagnosis not present

## 2012-08-20 DIAGNOSIS — H1045 Other chronic allergic conjunctivitis: Secondary | ICD-10-CM | POA: Diagnosis not present

## 2012-08-22 DIAGNOSIS — L259 Unspecified contact dermatitis, unspecified cause: Secondary | ICD-10-CM | POA: Diagnosis not present

## 2012-08-30 DIAGNOSIS — IMO0002 Reserved for concepts with insufficient information to code with codable children: Secondary | ICD-10-CM | POA: Diagnosis not present

## 2012-08-30 DIAGNOSIS — M546 Pain in thoracic spine: Secondary | ICD-10-CM | POA: Diagnosis not present

## 2012-08-30 DIAGNOSIS — M4310 Spondylolisthesis, site unspecified: Secondary | ICD-10-CM | POA: Diagnosis not present

## 2012-08-30 DIAGNOSIS — M47817 Spondylosis without myelopathy or radiculopathy, lumbosacral region: Secondary | ICD-10-CM | POA: Diagnosis not present

## 2012-08-30 DIAGNOSIS — M5137 Other intervertebral disc degeneration, lumbosacral region: Secondary | ICD-10-CM | POA: Diagnosis not present

## 2012-09-03 ENCOUNTER — Ambulatory Visit
Admission: RE | Admit: 2012-09-03 | Discharge: 2012-09-03 | Disposition: A | Discharge status: Home / Self Care | Payer: Medicare Other | Source: Ambulatory Visit | Attending: Neurosurgery | Admitting: Neurosurgery

## 2012-09-03 ENCOUNTER — Other Ambulatory Visit: Payer: Self-pay | Admitting: Neurosurgery

## 2012-09-03 DIAGNOSIS — M47817 Spondylosis without myelopathy or radiculopathy, lumbosacral region: Secondary | ICD-10-CM

## 2012-09-03 DIAGNOSIS — M48061 Spinal stenosis, lumbar region without neurogenic claudication: Secondary | ICD-10-CM | POA: Diagnosis not present

## 2012-09-03 DIAGNOSIS — M5137 Other intervertebral disc degeneration, lumbosacral region: Secondary | ICD-10-CM

## 2012-09-03 DIAGNOSIS — IMO0002 Reserved for concepts with insufficient information to code with codable children: Secondary | ICD-10-CM

## 2012-09-03 DIAGNOSIS — M4310 Spondylolisthesis, site unspecified: Secondary | ICD-10-CM

## 2012-09-06 DIAGNOSIS — M546 Pain in thoracic spine: Secondary | ICD-10-CM | POA: Diagnosis not present

## 2012-09-07 DIAGNOSIS — J309 Allergic rhinitis, unspecified: Secondary | ICD-10-CM | POA: Diagnosis not present

## 2012-09-12 DIAGNOSIS — M5126 Other intervertebral disc displacement, lumbar region: Secondary | ICD-10-CM | POA: Diagnosis not present

## 2012-09-26 DIAGNOSIS — J309 Allergic rhinitis, unspecified: Secondary | ICD-10-CM | POA: Diagnosis not present

## 2012-09-28 DIAGNOSIS — R634 Abnormal weight loss: Secondary | ICD-10-CM | POA: Diagnosis not present

## 2012-09-28 DIAGNOSIS — IMO0002 Reserved for concepts with insufficient information to code with codable children: Secondary | ICD-10-CM | POA: Diagnosis not present

## 2012-09-28 DIAGNOSIS — R197 Diarrhea, unspecified: Secondary | ICD-10-CM | POA: Diagnosis not present

## 2012-10-01 ENCOUNTER — Ambulatory Visit (INDEPENDENT_AMBULATORY_CARE_PROVIDER_SITE_OTHER): Payer: Medicare Other | Admitting: Obstetrics and Gynecology

## 2012-10-01 DIAGNOSIS — N950 Postmenopausal bleeding: Secondary | ICD-10-CM | POA: Diagnosis not present

## 2012-10-01 DIAGNOSIS — D261 Other benign neoplasm of corpus uteri: Secondary | ICD-10-CM | POA: Diagnosis not present

## 2012-10-01 NOTE — Progress Notes (Signed)
Patient came to see me today with a sudden onset of vaginal bleeding for 4 days. It is light without cramping. She has been using Estrace vaginal cream for 3 times a day for 6 months. She does this for atrophic vaginitis. On October 2 she had a steroid epidural injection for back pain.  Exam: Kennon Portela present. Pelvic exam: External within normal limits. BUS within normal limits. Vaginal exam within normal limits. Cervix is clean without lesions. Uterus is normal size and shape. Adnexa failed to reveal masses. Rectovaginal examination is confirmatory and without masses.   Assessment: Postmenopausal bleeding  Plan: Endometrial biopsy done. Very little tissue obtained. Discussed SIH. Discussed steroids occasionally causing bleeding. She will stop her vaginal estrogen for the moment.

## 2012-10-01 NOTE — Patient Instructions (Signed)
Stop vaginal estrogen.

## 2012-10-04 ENCOUNTER — Ambulatory Visit (INDEPENDENT_AMBULATORY_CARE_PROVIDER_SITE_OTHER): Payer: Medicare Other | Admitting: Obstetrics and Gynecology

## 2012-10-04 ENCOUNTER — Encounter: Payer: Self-pay | Admitting: Obstetrics and Gynecology

## 2012-10-04 VITALS — BP 110/78 | Ht 64.0 in | Wt 109.0 lb

## 2012-10-04 DIAGNOSIS — N950 Postmenopausal bleeding: Secondary | ICD-10-CM

## 2012-10-04 DIAGNOSIS — N952 Postmenopausal atrophic vaginitis: Secondary | ICD-10-CM | POA: Diagnosis not present

## 2012-10-04 DIAGNOSIS — M899 Disorder of bone, unspecified: Secondary | ICD-10-CM

## 2012-10-04 DIAGNOSIS — M949 Disorder of cartilage, unspecified: Secondary | ICD-10-CM | POA: Diagnosis not present

## 2012-10-04 DIAGNOSIS — I10 Essential (primary) hypertension: Secondary | ICD-10-CM | POA: Insufficient documentation

## 2012-10-04 DIAGNOSIS — M8580 Other specified disorders of bone density and structure, unspecified site: Secondary | ICD-10-CM

## 2012-10-04 NOTE — Patient Instructions (Signed)
Continue yearly mammograms. Bone density in December, 2014. Schedule saline infusion histogram.

## 2012-10-04 NOTE — Progress Notes (Signed)
Patient came back to see me today for further followup. We have seen her earlier in the week with an episode of postmenopausal bleeding. An endometrial biopsy was performed which came back normal. The bleeding has stopped. It was not associated with pelvic pain. It has been associated with using estrogen cream 3 times a week for the last 6 months. In addition she had a steroid epidural injection 2 weeks prior to the bleeding starting. She is not on hormone replacement therapy. Her last mammogram was February 2013. She has always had normal Pap smears. Her last Pap smear was 2012. She is not sexually active. She is osteopenia. She is currently on drug holiday after greater than 5 years use of biphosphonate's. A bone density in 2012 in December was stable. She is having back issues with spinal stenosis.  ROS: 12 system review done. Pertinent positives above. Other positives include hyperlipidemia, hypertension, recurrent bronchitis,Hemoptysis although not currently.  Physical examination:Kim Julian Reil present. HEENT within normal limits. Neck: Thyroid not large. No masses. Supraclavicular nodes: not enlarged. Breasts: Examined in both sitting and lying  position. No skin changes and no masses. Abdomen: Soft no guarding rebound or masses or hernia. Pelvic: External: Within normal limits. BUS: Within normal limits. Vaginal:within normal limits. Poor  estrogen effect. No evidence of cystocele rectocele or enterocele. Cervix: clean. Uterus: Normal size and shape. Adnexa: No masses. Rectovaginal exam: Confirmatory and negative. Extremities: Within normal limits.  Assessment: #1. Atrophic vaginitis #2. Postmenopausal bleeding #3. Osteopenia #4. Aunt  with early onset breast cancer.  Plan: Continue yearly mammograms. Pap not done.The new Pap smear guidelines were discussed with the patient. Schedule saline infusion histogram. Continue periodic bone densities. Genetic counseling for BRCA1 and 2.

## 2012-10-09 DIAGNOSIS — M5126 Other intervertebral disc displacement, lumbar region: Secondary | ICD-10-CM | POA: Diagnosis not present

## 2012-10-10 DIAGNOSIS — E785 Hyperlipidemia, unspecified: Secondary | ICD-10-CM | POA: Diagnosis not present

## 2012-10-10 DIAGNOSIS — Z23 Encounter for immunization: Secondary | ICD-10-CM | POA: Diagnosis not present

## 2012-10-10 DIAGNOSIS — Z1331 Encounter for screening for depression: Secondary | ICD-10-CM | POA: Diagnosis not present

## 2012-10-10 DIAGNOSIS — Z0000 Encounter for general adult medical examination without abnormal findings: Secondary | ICD-10-CM | POA: Diagnosis not present

## 2012-10-10 DIAGNOSIS — R636 Underweight: Secondary | ICD-10-CM | POA: Diagnosis not present

## 2012-10-12 DIAGNOSIS — J309 Allergic rhinitis, unspecified: Secondary | ICD-10-CM | POA: Diagnosis not present

## 2012-10-15 ENCOUNTER — Encounter: Payer: Self-pay | Admitting: Internal Medicine

## 2012-10-15 ENCOUNTER — Telehealth: Payer: Self-pay | Admitting: Internal Medicine

## 2012-10-15 NOTE — Telephone Encounter (Signed)
Pt calling for recs for GERD. MW is not in the office today and is not the pt's PCP. I have instructed the pt to call her PCP, Dr. Kirby Funk, for recs and to see if they will refer her to GI if that is what she wants. Pt was frustrated but did verbalize understanding.

## 2012-10-15 NOTE — Telephone Encounter (Signed)
LMOMTCB x 1 

## 2012-10-16 ENCOUNTER — Telehealth: Payer: Self-pay | Admitting: Internal Medicine

## 2012-10-16 NOTE — Telephone Encounter (Signed)
Pt states she is having a lot of epigastric pain and gerd. Requesting to be seen sooner than appt with Dr. Marina Goodell. Pt scheduled to see Mike Gip PA 10/18/12@3pm . Pt aware of appt date and time.

## 2012-10-18 ENCOUNTER — Encounter: Payer: Self-pay | Admitting: Physician Assistant

## 2012-10-18 ENCOUNTER — Ambulatory Visit (INDEPENDENT_AMBULATORY_CARE_PROVIDER_SITE_OTHER): Payer: BLUE CROSS/BLUE SHIELD | Admitting: Physician Assistant

## 2012-10-18 VITALS — BP 120/76 | HR 72 | Ht 64.0 in | Wt 110.6 lb

## 2012-10-18 DIAGNOSIS — K219 Gastro-esophageal reflux disease without esophagitis: Secondary | ICD-10-CM

## 2012-10-18 MED ORDER — DEXLANSOPRAZOLE 60 MG PO CPDR: 60.0000 mg | DELAYED_RELEASE_CAPSULE | Freq: Every day | ORAL | Status: DC

## 2012-10-18 NOTE — Progress Notes (Signed)
Subjective:    Patient ID: Karl Pock, female    DOB: November 29, 1946, 66 y.o.   MRN: [id number]  HPI Liliah is a 66 year old female, new to GI today. She has upcoming appointment with Dr. Marina Goodell but called to be seen sooner due to persistent symptoms. She has diagnosis of chronic rhinitis and asthma. Hypertension and hyperlipidemia. She had been seen by Dr. Sherene Sires in August who felt that some of her symptoms were likely related to reflux and had advised aggressive reflux therapy when she was having flares. Patient says that she's been having episodes of "something" for many years. She has not been diagnosed with having reflux in the past but says that she has a lot of problems with mucus in her throat and persistent need to clear her throat as well as more tense episodes which occur least once a year and last for a few day when she has a sore throat some chest pressure And discomfort with radiation into her right neck and generally it comes on with generalized congestion. At this point she suffered a herniated lumbar disc about 2 months ago and states that her activity level had been significantly decreased and she was doing a lot of lying on her back resting and also taking at least 5 Aleve  per day over  A 3 week period.She says with that she started having diarrhea now about 2 weeks ago stopped the Aleve. She says she lost weight during that period of time and started eating everything she did fine including all the wrong foods and drinking some wine and vodka at night and then started having a significant episode of her reflux symptoms with chest pressure tightness pain up into her right neck and mucus in her throat . She denies any dysphagia or odynophagia, denies any nausea or vomiting, denies any abdominal pain. She has taken some Prilosec over-the-counter and took some Zegerid but has not taken anything on a regular basis with this episode.. She has altered her diet and stopped drinking alcohol about a  week ago but is still having daily symptoms. 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    Review of Systems  Constitutional: Positive for unexpected weight change. Negative for appetite change.  HENT: Positive for postnasal drip.   Eyes: Negative.   Respiratory: Positive for chest tightness.   Cardiovascular: Negative.   Gastrointestinal: Negative.   Genitourinary: Negative.   Musculoskeletal: Negative.   Skin: Negative.   Neurological: Negative.   Hematological: Negative.   Psychiatric/Behavioral: The patient is nervous/anxious.    Outpatient Prescriptions Prior to Visit  Medication Sig Dispense Refill  . Beclomethasone Dipropionate (QVAR IN) Inhale into the lungs.      . Calcium Carbonate-Vitamin D (CALCIUM + D PO) Take 1 tablet by mouth daily.       Marland Kitchen estradiol (ESTRACE VAGINAL) 0.1 MG/GM vaginal cream Place 0.25 Applicatorfuls vaginally daily.  42.5 g  5  . FIBER COMPLETE PO Take 1 tablet by mouth daily.      Marland Kitchen losartan (COZAAR) 50 MG tablet Take 50 mg by mouth daily.      . Misc Natural Products (TUMERSAID PO) Take 1 tablet by mouth daily.      . Multiple Vitamin (MULTIVITAMIN) capsule Take 1 capsule by mouth daily.        . Probiotic Product (PROBIOTIC DAILY PO) Take 1 tablet by mouth daily.      Marland Kitchen  simvastatin (ZOCOR) 40 MG tablet Take 60 mg by mouth at bedtime. Taking 60 mg      . zaleplon (SONATA) 5 MG capsule Take 5 mg by mouth as needed.        Marland Kitchen dexlansoprazole (DEXILANT) 60 MG capsule Take 1 capsule (60 mg total) by mouth daily.  30 capsule  0  . famotidine (PEPCID) 20 MG tablet One at bedtime  30 tablet  11  . zaleplon (SONATA) 5 MG capsule Take 1 capsule (5 mg total) by mouth at bedtime.  30 capsule  2   Allergies  Allergen Reactions  . Codeine   . Erythromycin   . Sulfonamide Derivatives   . Tramadol    Patient Active Problem List  Diagnosis  . HYPERLIPIDEMIA  .  BRONCHITIS, ACUTE  . CHRONIC RHINITIS  . PULMONARY INFILTRATE INCLUDES (EOSINOPHILIA)  . Cough  . HEMOPTYSIS  . Fibroid  . Lichen sclerosus et atrophicus of the vulva  . Osteopenia  . Atrophic vaginitis  . Extrinsic asthma  . Hypertension  . Back pain   History  Substance Use Topics  . Smoking status: Former Smoker -- 20 years    Types: Cigarettes    Quit date: 12/12/1988  . Smokeless tobacco: Never Used     Comment: "random smoker"  . Alcohol Use: 5.0 oz/week    10 drink(s) per week       Objective:   Physical Exam well-developed very thin older white female in no acute distress, pleasant blood pressure 120/76 pulse 72 height 5 foot 4 weight 110. HEENT; nontraumatic normocephalic EOMI PERRLA sclera anicteric,Neck; Supple no JVD, Cardiovascula;r regular rate and rhythm with S1-S2 no murmur ,, Pulmonary; clear bilaterally, Abdomen ;soft, minimal epigastric tenderness no guarding or rebound no palpable mass or hepatosplenomegaly bowel sounds are present, Rectal; not done, Extremities; no clubbing cyanosis or edema skin warm and dry, Psych; mood and affect normal and appropriate        Assessment & Plan:  #79 66 year old white female with probable chronic GERD with recent exacerbation triggered by supine position and NSAID use #2 Asthma and chronic rhinitis #3 hypertension  Plan; long discussion with patient regarding an antireflux regimen. We also discussed elevation of the head of the bed and n.p.o. for at least 2 hours before bedtime as well as discontinuing her evening alcohol use. We'll start Dexilant  60 mg by mouth every morning and stressed the need to take this on a regular basis, she was given several weeks worth of samples and a prescription. She will keep her followup appointment with Dr. Marina Goodell in December and at that time can discuss indication for endoscopy .

## 2012-10-18 NOTE — Progress Notes (Signed)
Agree with initial assessment and plans. I will see her in followup

## 2012-10-18 NOTE — Patient Instructions (Addendum)
We  sent the Dexilant prescriptio to  [facility]. We have given you Antireflux brochures. Keep your appointment with Dr. Marina Goodell on [phone] at 9:30 AM.

## 2012-10-23 DIAGNOSIS — J309 Allergic rhinitis, unspecified: Secondary | ICD-10-CM | POA: Diagnosis not present

## 2012-10-31 DIAGNOSIS — J309 Allergic rhinitis, unspecified: Secondary | ICD-10-CM | POA: Diagnosis not present

## 2012-11-01 DIAGNOSIS — M5126 Other intervertebral disc displacement, lumbar region: Secondary | ICD-10-CM | POA: Diagnosis not present

## 2012-11-01 DIAGNOSIS — M47817 Spondylosis without myelopathy or radiculopathy, lumbosacral region: Secondary | ICD-10-CM | POA: Diagnosis not present

## 2012-11-12 DIAGNOSIS — H2510 Age-related nuclear cataract, unspecified eye: Secondary | ICD-10-CM | POA: Diagnosis not present

## 2012-11-12 DIAGNOSIS — H5210 Myopia, unspecified eye: Secondary | ICD-10-CM | POA: Diagnosis not present

## 2012-11-12 DIAGNOSIS — H524 Presbyopia: Secondary | ICD-10-CM | POA: Diagnosis not present

## 2012-11-12 DIAGNOSIS — H26109 Unspecified traumatic cataract, unspecified eye: Secondary | ICD-10-CM | POA: Diagnosis not present

## 2012-11-12 DIAGNOSIS — H52209 Unspecified astigmatism, unspecified eye: Secondary | ICD-10-CM | POA: Diagnosis not present

## 2012-11-13 ENCOUNTER — Ambulatory Visit: Payer: Medicare Other | Admitting: Internal Medicine

## 2012-11-14 DIAGNOSIS — J309 Allergic rhinitis, unspecified: Secondary | ICD-10-CM | POA: Diagnosis not present

## 2012-11-16 ENCOUNTER — Ambulatory Visit: Payer: Medicare Other | Admitting: Internal Medicine

## 2012-11-19 ENCOUNTER — Telehealth: Payer: Self-pay | Admitting: *Deleted

## 2012-11-19 MED ORDER — ESZOPICLONE 3 MG PO TABS: 3.0000 mg | ORAL_TABLET | Freq: Every day | ORAL | Status: DC

## 2012-11-19 NOTE — Telephone Encounter (Signed)
rx called into pharmacy. Pt informed.  

## 2012-11-19 NOTE — Addendum Note (Signed)
Addended by: Aura Camps on: 11/19/2012 03:14 PM   Modules accepted: Orders

## 2012-11-19 NOTE — Telephone Encounter (Signed)
Has she ever tried [place] before? Is which does-2 mg  or 3 mg.

## 2012-11-19 NOTE — Telephone Encounter (Signed)
Pt said that she tried a pill that her friend give her and she couldn't remember the dose, but she only took a half of pill.

## 2012-11-19 NOTE — Telephone Encounter (Signed)
Pt is having trouble with sleeping at night and would like a Rx for lunesta if possible? Pt said she will make appointment if needed. Please advise

## 2012-11-19 NOTE — Telephone Encounter (Signed)
Give her 3 mg Lunesta. Give her 30 with 4  refills.

## 2012-11-20 ENCOUNTER — Ambulatory Visit (INDEPENDENT_AMBULATORY_CARE_PROVIDER_SITE_OTHER): Payer: BLUE CROSS/BLUE SHIELD | Admitting: Internal Medicine

## 2012-11-20 ENCOUNTER — Encounter: Payer: Self-pay | Admitting: Internal Medicine

## 2012-11-20 VITALS — BP 104/60 | HR 70 | Ht 64.0 in | Wt 110.0 lb

## 2012-11-20 DIAGNOSIS — K219 Gastro-esophageal reflux disease without esophagitis: Secondary | ICD-10-CM | POA: Diagnosis not present

## 2012-11-20 DIAGNOSIS — F411 Generalized anxiety disorder: Secondary | ICD-10-CM

## 2012-11-20 DIAGNOSIS — K5790 Diverticulosis of intestine, part unspecified, without perforation or abscess without bleeding: Secondary | ICD-10-CM

## 2012-11-20 DIAGNOSIS — F419 Anxiety disorder, unspecified: Secondary | ICD-10-CM

## 2012-11-20 DIAGNOSIS — K5730 Diverticulosis of large intestine without perforation or abscess without bleeding: Secondary | ICD-10-CM

## 2012-11-20 DIAGNOSIS — R6889 Other general symptoms and signs: Secondary | ICD-10-CM | POA: Diagnosis not present

## 2012-11-20 NOTE — Progress Notes (Signed)
HISTORY OF PRESENT ILLNESS:  Madison Kramer is a 66 y.o. female with hypertension, hyperlipidemia, back pain, and GERD. She is new to this GI practice and was initially seen 10/18/2012 by Mike Gip PAC. See that dictation for details. She had a myriad of complaints at that time. She was treated with reflux precautions and Dexilant. She presents today for followup. Previous GI care was with Dr. Blinda Leatherwood. Last colonoscopy 10/03/2007 with diverticulosis only. Followup in 10 years recommended. She tells me that she has had reflux for 30 years. Sometimes she is on medical therapy, sometimes she is not. She denies any classic symptoms at any time. Specifically, no pyrosis, water brash, or esophageal dysphagia. Her chief concern is that of thick mucus in the pharynx which seems to be most noticeable after meals. She needs to engage in throat clearing behavior which she finds inhibits her lifestyle and social life. She finds that alcohol is problematic and has been avoiding such. The problem has been worse over the past 3 months. She is concerned about possible roast of effects on her esophagus, even cancer. Other complaints include a chest heaviness with radiation up to the right jaw. She describes it as an air bubble. She suffered a few months ago with herniated lumbar disc. She had been using NSAIDs. She does report stress. Is not clear that Dexilant therapy started last month has been helpful yet. Other medications as listed. Currently not on medication for anxiety or depression. No new or active lower GI complaints. She tells me that she recently saw her dentist. Because of cavities, reflux was suggested to be a cause or possible cause  REVIEW OF SYSTEMS:  All non-GI ROS negative except for arthritis, back pain, sore throat, excessive urination  Past Medical History  Diagnosis Date  . Lichen sclerosus et atrophicus of the vulva   . Osteopenia   . Atrophic vaginitis   . Elevated cholesterol   .  Hypertension   . Fibroid     SUBMUCOUS MYOMA  . Back pain   . Asthma   . Allergic rhinitis   . Diverticulosis   . Lactose intolerance   . GERD (gastroesophageal reflux disease)     Past Surgical History  Procedure Date  . Breast surgery 2001    REDUCTION MAMMAPLASTY  . Tubal ligation 1987  . Inguinal hernia repair 1971    BILATERAL  . Hysteroscopy 2003    HYSTEROSCOPIC RESECTION OF MYOMA  . Foot surgery 2009  . Tonsillectomy   . Mandible surgery   . Myomectomy     Social History Madison Kramer  reports that she quit smoking about 23 years ago. Her smoking use included Cigarettes. She quit after 20 years of use. She has never used smokeless tobacco. She reports that she drinks about 5 ounces of alcohol per week. She reports that she does not use illicit drugs.  family history includes Breast cancer in her maternal aunt; Heart disease in her maternal uncle and mother; Hypertension in her mother; and Multiple myeloma in her father.  Allergies  Allergen Reactions  . Codeine   . Erythromycin   . Sulfonamide Derivatives   . Tramadol        PHYSICAL EXAMINATION: Vital signs: BP 104/60  Pulse 70  Ht 5\' 4"  (1.626 m)  Wt 110 lb (49.896 kg)  BMI 18.88 kg/m2  Constitutional: generally well-appearing, no acute distress Psychiatric: anxious,alert and oriented x3, cooperative Eyes: extraocular movements intact, anicteric, conjunctiva pink Mouth: oral pharynx moist,  no lesions Neck: supple no lymphadenopathy Cardiovascular: heart regular rate and rhythm, no murmur Lungs: clear to auscultation bilaterally Abdomen: soft, nontender, nondistended, no obvious ascites, no peritoneal signs, normal bowel sounds, no organomegaly Extremities: no lower extremity edema bilaterally Skin: no lesions on visible extremities Neuro: No focal deficits. No asterixis.    ASSESSMENT:  #1. Pharyngeal complaints with thick mucus. Etiology unclear. Multiple other complaints that she attributes  to reflux. Not clear to me what complaints, if any, are truly related to reflux. We discussed this in great detail #2. Diverticulosis on screening colonoscopy 2008 #3. Health-related anxiety. Significant   PLAN:  #1. Continue Dexilant 60 mg daily, for now. Would consider a 3 month (total) trial #2. Reflux precautions #3. Diagnostic upper endoscopy.The nature of the procedure, as well as the risks, benefits, and alternatives were carefully and thoroughly reviewed with the patient. Ample time for discussion and questions allowed. The patient understood, was satisfied, and agreed to proceed.  #4. Consider therapy for anxiety/depression. Best addressed with PCP #5. Routine surveillance colonoscopy around 2018

## 2012-11-20 NOTE — Patient Instructions (Addendum)
You have been scheduled for an endoscopy with propofol. Please follow written instructions given to you at your visit today. If you use inhalers (even only as needed) or a CPAP machine, please bring them with you on the day of your procedure.  Stay on your Dexilant as prescribed

## 2012-11-22 DIAGNOSIS — J309 Allergic rhinitis, unspecified: Secondary | ICD-10-CM | POA: Diagnosis not present

## 2012-11-26 ENCOUNTER — Telehealth: Payer: Self-pay | Admitting: Internal Medicine

## 2012-11-26 NOTE — Telephone Encounter (Signed)
Returned patient's call and she was concerned about when she should stop eating and drinking.  She had threw her instructions away and couldn't remember.  I explained to her in great detail when she should stop eating and when she should stop drinking clear liquids.  She agreed and understood.  She then continued asking me if her having sinus problems would interfere with her having the procedure.  I advised that it should not as long as she is not running a temperature of higher than 100 degrees.  All questions were answered.

## 2012-11-27 ENCOUNTER — Encounter: Payer: Self-pay | Admitting: Internal Medicine

## 2012-11-27 ENCOUNTER — Ambulatory Visit (AMBULATORY_SURGERY_CENTER): Payer: BLUE CROSS/BLUE SHIELD | Admitting: Internal Medicine

## 2012-11-27 VITALS — BP 128/60 | HR 59 | Temp 97.1°F | Resp 34 | Ht 64.0 in | Wt 110.0 lb

## 2012-11-27 DIAGNOSIS — F458 Other somatoform disorders: Secondary | ICD-10-CM

## 2012-11-27 DIAGNOSIS — K219 Gastro-esophageal reflux disease without esophagitis: Secondary | ICD-10-CM | POA: Diagnosis not present

## 2012-11-27 DIAGNOSIS — R0989 Other specified symptoms and signs involving the circulatory and respiratory systems: Secondary | ICD-10-CM

## 2012-11-27 MED ORDER — SODIUM CHLORIDE 0.9 % IV SOLN: 500.0000 mL | INTRAVENOUS | Status: DC

## 2012-11-27 NOTE — Op Note (Signed)
[facility] [address] [place], [address]   ENDOSCOPY PROCEDURE REPORT  PATIENT: Madison, Kramer  MR#: [id number] BIRTHDATE: [birth year] , 66  yrs. old GENDER: Female ENDOSCOPIST: Roxy Cedar, MD REFERRED BY:  .  Self / Office PROCEDURE DATE:  11/27/2012 PROCEDURE:  EGD, diagnostic ASA CLASS:     Class II INDICATIONS:  Pharyngeal complaints, ? esophageal reflux / other. MEDICATIONS: MAC sedation, administered by CRNA and propofol (Diprivan) 300mg  IV TOPICAL ANESTHETIC: none  DESCRIPTION OF PROCEDURE: After the risks benefits and alternatives of the procedure were thoroughly explained, informed consent was obtained.  The LB-GIF Q180 [id number] endoscope was introduced through the mouth and advanced to the second portion of the duodenum. Without limitations.  The instrument was slowly withdrawn as the mucosa was fully examined.      The upper, middle and distal third of the esophagus were carefully inspected and no abnormalities were noted.  The z-line was well seen at the GEJ.  The endoscope was pushed into the fundus which was normal including a retroflexed view.  The antrum, gastric body, first and second part of the duodenum were unremarkable. Retroflexed views revealed a hiatal hernia.     The scope was then withdrawn from the patient and the procedure completed.  COMPLICATIONS: There were no complications.  ENDOSCOPIC IMPRESSION: 1.Normal EGD  RECOMMENDATIONS: 1.  Continue PPI (Dexilant) daily 2.  OP follow-up in 2 months.  REPEAT EXAM:  eSigned:  Roxy Cedar, MD 11/27/2012 3:07 PM   XB:JYNW Valentina Lucks, MD and The Patient

## 2012-11-27 NOTE — Patient Instructions (Addendum)
Impressions/recommendations:  Normal exam  Continue Dexilant daily Follow up with Dr. Marina Goodell in the office in 2 months.  YOU HAD AN ENDOSCOPIC PROCEDURE TODAY AT THE [facility]: Refer to the procedure report that was given to you for any specific questions about what was found during the examination.  If the procedure report does not answer your questions, please call your gastroenterologist to clarify.  If you requested that your care partner not be given the details of your procedure findings, then the procedure report has been included in a sealed envelope for you to review at your convenience later.  YOU SHOULD EXPECT: Some feelings of bloating in the abdomen. Passage of more gas than usual.  Walking can help get rid of the air that was put into your GI tract during the procedure and reduce the bloating. If you had a lower endoscopy (such as a colonoscopy or flexible sigmoidoscopy) you may notice spotting of blood in your stool or on the toilet paper. If you underwent a bowel prep for your procedure, then you may not have a normal bowel movement for a few days.  DIET: Your first meal following the procedure should be a light meal and then it is ok to progress to your normal diet.  A half-sandwich or bowl of soup is an example of a good first meal.  Heavy or fried foods are harder to digest and may make you feel nauseous or bloated.  Likewise meals heavy in dairy and vegetables can cause extra gas to form and this can also increase the bloating.  Drink plenty of fluids but you should avoid alcoholic beverages for 24 hours.  ACTIVITY: Your care partner should take you home directly after the procedure.  You should plan to take it easy, moving slowly for the rest of the day.  You can resume normal activity the day after the procedure however you should NOT DRIVE or use heavy machinery for 24 hours (because of the sedation medicines used during the test).    SYMPTOMS TO REPORT  IMMEDIATELY: A gastroenterologist can be reached at any hour.  During normal business hours, 8:30 AM to 5:00 PM Monday through Friday, call [phone].  After hours and on weekends, please call the GI answering service at [phone] who will take a message and have the physician on call contact you.   Following upper endoscopy (EGD)  Vomiting of blood or coffee ground material  New chest pain or pain under the shoulder blades  Painful or persistently difficult swallowing  New shortness of breath  Fever of 100F or higher  Black, tarry-looking stools  FOLLOW UP: If any biopsies were taken you will be contacted by phone or by letter within the next 1-3 weeks.  Call your gastroenterologist if you have not heard about the biopsies in 3 weeks.  Our staff will call the home number listed on your records the next business day following your procedure to check on you and address any questions or concerns that you may have at that time regarding the information given to you following your procedure. This is a courtesy call and so if there is no answer at the home number and we have not heard from you through the emergency physician on call, we will assume that you have returned to your regular daily activities without incident.  SIGNATURES/CONFIDENTIALITY: You and/or your care partner have signed paperwork which will be entered into your electronic medical record.  These signatures attest to the  fact that that the information above on your After Visit Summary has been reviewed and is understood.  Full responsibility of the confidentiality of this discharge information lies with you and/or your care-partner.

## 2012-11-27 NOTE — Progress Notes (Signed)
Patient did not experience any of the following events: a burn prior to discharge; a fall within the facility; wrong site/side/patient/procedure/implant event; or a hospital transfer or hospital admission upon discharge from the facility. (G8907) Patient did not have preoperative order for IV antibiotic SSI prophylaxis. (G8918)  

## 2012-11-28 ENCOUNTER — Telehealth: Payer: Self-pay

## 2012-11-28 NOTE — Telephone Encounter (Signed)
  Follow up Call-  Call back number 11/27/2012  Post procedure Call Back phone  # [phone]  Permission to leave phone message Yes     Patient questions:  Do you have a fever, pain , or abdominal swelling? no Pain Score  0 *  Have you tolerated food without any problems? yes  Have you been able to return to your normal activities? yes  Do you have any questions about your discharge instructions: Diet   no Medications  no Follow up visit  no  Do you have questions or concerns about your Care? no  Actions: * If pain score is 4 or above: No action needed, pain <4.

## 2012-11-30 DIAGNOSIS — J309 Allergic rhinitis, unspecified: Secondary | ICD-10-CM | POA: Diagnosis not present

## 2012-12-14 DIAGNOSIS — J309 Allergic rhinitis, unspecified: Secondary | ICD-10-CM | POA: Diagnosis not present

## 2012-12-14 DIAGNOSIS — M5126 Other intervertebral disc displacement, lumbar region: Secondary | ICD-10-CM | POA: Diagnosis not present

## 2012-12-17 ENCOUNTER — Telehealth: Payer: Self-pay | Admitting: Internal Medicine

## 2012-12-17 NOTE — Telephone Encounter (Signed)
Left message informing patient she was, in fact, supposed to continue taking Dexilant; I told her I had a few samples and asked if she wanted me to call in a prescription

## 2012-12-18 NOTE — Telephone Encounter (Signed)
Lm on vm 

## 2012-12-20 DIAGNOSIS — J309 Allergic rhinitis, unspecified: Secondary | ICD-10-CM | POA: Diagnosis not present

## 2013-01-04 DIAGNOSIS — J309 Allergic rhinitis, unspecified: Secondary | ICD-10-CM | POA: Diagnosis not present

## 2013-01-11 DIAGNOSIS — J309 Allergic rhinitis, unspecified: Secondary | ICD-10-CM | POA: Diagnosis not present

## 2013-01-15 DIAGNOSIS — L608 Other nail disorders: Secondary | ICD-10-CM | POA: Diagnosis not present

## 2013-01-22 DIAGNOSIS — J309 Allergic rhinitis, unspecified: Secondary | ICD-10-CM | POA: Diagnosis not present

## 2013-01-28 DIAGNOSIS — J309 Allergic rhinitis, unspecified: Secondary | ICD-10-CM | POA: Diagnosis not present

## 2013-01-30 ENCOUNTER — Ambulatory Visit (INDEPENDENT_AMBULATORY_CARE_PROVIDER_SITE_OTHER): Payer: Medicare Other | Admitting: Internal Medicine

## 2013-01-30 ENCOUNTER — Encounter: Payer: Self-pay | Admitting: Internal Medicine

## 2013-01-30 VITALS — BP 106/62 | HR 60 | Ht 64.0 in | Wt 112.0 lb

## 2013-01-30 DIAGNOSIS — K219 Gastro-esophageal reflux disease without esophagitis: Secondary | ICD-10-CM

## 2013-01-30 MED ORDER — DEXLANSOPRAZOLE 60 MG PO CPDR: 60.0000 mg | DELAYED_RELEASE_CAPSULE | Freq: Every day | ORAL | Status: DC

## 2013-01-30 NOTE — Patient Instructions (Addendum)
I have given you some samples of Dexilant  Please follow up with Dr. Marina Goodell in 3 months

## 2013-01-30 NOTE — Progress Notes (Signed)
HISTORY OF PRESENT ILLNESS:  Madison Kramer is a 67 y.o. female with hypertension, hyperlipidemia, back pain, and GERD. She was seen 11/20/2012 with a chief complaint of thick mucus in the pharynx and throat clearing behavior. She underwent upper endoscopy on December 17. This was normal. She was instructed to stay on a daily Dexilant and followup at this time. She reports to me that she is significantly better. Minimal symptoms. She is happy to report that her appetite is better, she's putting on weight. Her back is feeling better, etc. No reflux symptoms. No new complaints.  REVIEW OF SYSTEMS:  All non-GI ROS negative except for sinus and allergy trouble, arthritis, back pain, muscle pains  Past Medical History  Diagnosis Date  . Lichen sclerosus et atrophicus of the vulva   . Osteopenia   . Atrophic vaginitis   . Elevated cholesterol   . Hypertension   . Fibroid     SUBMUCOUS MYOMA  . Back pain   . Asthma   . Allergic rhinitis   . Diverticulosis   . Lactose intolerance   . GERD (gastroesophageal reflux disease)   . Arthritis     Past Surgical History  Procedure Laterality Date  . Breast surgery  2001    REDUCTION MAMMAPLASTY  . Tubal ligation  1987  . Inguinal hernia repair  1971    BILATERAL  . Hysteroscopy  2003    HYSTEROSCOPIC RESECTION OF MYOMA  . Foot surgery  2009  . Tonsillectomy    . Mandible surgery    . Myomectomy    . Colonoscopy      Social History RAE PLOTNER  reports that she quit smoking about 24 years ago. Her smoking use included Cigarettes. She smoked 0.00 packs per day for 20 years. She has never used smokeless tobacco. She reports that she drinks about 5.0 ounces of alcohol per week. She reports that she does not use illicit drugs.  family history includes Breast cancer in her maternal aunt; Heart disease in her maternal uncle and mother; Hypertension in her mother; and Multiple myeloma in her father.  Allergies  Allergen Reactions  .  Codeine   . Erythromycin   . Sulfonamide Derivatives   . Tramadol        PHYSICAL EXAMINATION: Vital signs: BP 106/62  Pulse 60  Ht 5\' 4"  (1.626 m)  Wt 112 lb (50.803 kg)  BMI 19.22 kg/m2 General: Well-developed, well-nourished, no acute distress HEENT: Sclerae are anicteric, conjunctiva pink. Oral mucosa intact Lungs: Clear Heart: Regular Abdomen: soft, nontender, nondistended, no obvious ascites, no peritoneal signs, normal bowel sounds. No organomegaly. Extremities: No edema Psychiatric: alert and oriented x3. Cooperative    ASSESSMENT:  1. Recent pharyngeal complaints of uncertain etiology. Improved. Question reflux equivalent 2. Diverticulosis on screening colonoscopy 2008 3. Health related anxiety   PLAN:  1. Wean PPI to every other day for 2 weeks then every third day for 2 weeks then stop 2. Routine office followup in 3 months 3. Routine screening colonoscopy 2018

## 2013-01-31 DIAGNOSIS — J309 Allergic rhinitis, unspecified: Secondary | ICD-10-CM | POA: Diagnosis not present

## 2013-02-04 ENCOUNTER — Encounter: Payer: Self-pay | Admitting: Gynecology

## 2013-02-04 DIAGNOSIS — Z1231 Encounter for screening mammogram for malignant neoplasm of breast: Secondary | ICD-10-CM | POA: Diagnosis not present

## 2013-02-07 DIAGNOSIS — J309 Allergic rhinitis, unspecified: Secondary | ICD-10-CM | POA: Diagnosis not present

## 2013-02-11 DIAGNOSIS — J309 Allergic rhinitis, unspecified: Secondary | ICD-10-CM | POA: Diagnosis not present

## 2013-02-14 DIAGNOSIS — J309 Allergic rhinitis, unspecified: Secondary | ICD-10-CM | POA: Diagnosis not present

## 2013-02-18 DIAGNOSIS — J309 Allergic rhinitis, unspecified: Secondary | ICD-10-CM | POA: Diagnosis not present

## 2013-02-20 DIAGNOSIS — J309 Allergic rhinitis, unspecified: Secondary | ICD-10-CM | POA: Diagnosis not present

## 2013-02-27 DIAGNOSIS — J309 Allergic rhinitis, unspecified: Secondary | ICD-10-CM | POA: Diagnosis not present

## 2013-03-05 DIAGNOSIS — M5126 Other intervertebral disc displacement, lumbar region: Secondary | ICD-10-CM | POA: Diagnosis not present

## 2013-03-06 DIAGNOSIS — J309 Allergic rhinitis, unspecified: Secondary | ICD-10-CM | POA: Diagnosis not present

## 2013-03-07 ENCOUNTER — Telehealth: Payer: Self-pay | Admitting: Internal Medicine

## 2013-03-07 NOTE — Telephone Encounter (Signed)
Resume Dexilant once daily until she returns from her trip. She can try to wean off again after returning from her trip.

## 2013-03-07 NOTE — Telephone Encounter (Signed)
Pt aware.

## 2013-03-07 NOTE — Telephone Encounter (Signed)
Pt last OV with Dr. Marina Goodell 01/30/13. Pt had problems with GERD and an EGD was done, pt was placed on Dexilant. At OV pt was told to try and wean off the Dexilant by taking 1 every other day for 2 weeks and then 1 every 3 days for 2 weeks. Pt is currently taking the dexilant 1 every 3 days but she reports she is starting to have the same symptoms as before she started taking it. Pt states she is having lots of bloating, mucous, and a heavy feeling in her chest. Pt wants to know what she should do regarding the dexilant. Pt states she is afraid to eat anything and she is getting ready to go out of town to visit her children. Dr. Marina Goodell please advise.

## 2013-03-07 NOTE — Telephone Encounter (Signed)
Dr. Marina Goodell had told her to start coming off the Dexilant.  She has been doing this and had a bad flair up of acid reflux.  Should she go back on the medicine or what?

## 2013-03-14 DIAGNOSIS — J309 Allergic rhinitis, unspecified: Secondary | ICD-10-CM | POA: Diagnosis not present

## 2013-03-15 ENCOUNTER — Telehealth: Payer: Self-pay | Admitting: Internal Medicine

## 2013-03-15 DIAGNOSIS — I10 Essential (primary) hypertension: Secondary | ICD-10-CM | POA: Diagnosis not present

## 2013-03-15 DIAGNOSIS — E782 Mixed hyperlipidemia: Secondary | ICD-10-CM | POA: Diagnosis not present

## 2013-03-15 NOTE — Telephone Encounter (Signed)
I think that if she was not taking it daily-every day for one week, then 60 mg daily, at this time, is adequate. If she had been taking 60 mg every day, then twice a day trial, short term, would be okay.

## 2013-03-15 NOTE — Telephone Encounter (Signed)
Pt tried to stop the Dexilant but started having problems with reflux again. Pt called and we told her to take the Dexilant while she was out of town and to try and stop it when she got back in town. She has taken 2 pills since Sunday but states she has been suffering with lots of bloating, mucous, and heaviness in her chest. Pt saw her cardiologist, Dr. [name], and he told her to take Dexilant BID for 2 weeks and then daily until she sees Dr. Marina Goodell again. Pt wants to know if Dr. Marina Goodell agrees with this before she does the BID dosing. Please advise.

## 2013-03-15 NOTE — Telephone Encounter (Signed)
Spoke with pt and she will start taking Dexilant 60mg  daily. Pt knows to call us if she has any further issues.

## 2013-03-19 DIAGNOSIS — J309 Allergic rhinitis, unspecified: Secondary | ICD-10-CM | POA: Diagnosis not present

## 2013-04-01 ENCOUNTER — Other Ambulatory Visit (HOSPITAL_COMMUNITY): Payer: Self-pay | Admitting: Cardiovascular Disease

## 2013-04-01 DIAGNOSIS — I10 Essential (primary) hypertension: Secondary | ICD-10-CM

## 2013-04-01 DIAGNOSIS — R011 Cardiac murmur, unspecified: Secondary | ICD-10-CM

## 2013-04-01 DIAGNOSIS — J309 Allergic rhinitis, unspecified: Secondary | ICD-10-CM | POA: Diagnosis not present

## 2013-04-04 ENCOUNTER — Telehealth: Payer: Self-pay | Admitting: Internal Medicine

## 2013-04-04 NOTE — Telephone Encounter (Signed)
Lm on vm that I would leave samples up front for her to pick up

## 2013-04-08 DIAGNOSIS — J309 Allergic rhinitis, unspecified: Secondary | ICD-10-CM | POA: Diagnosis not present

## 2013-04-09 ENCOUNTER — Ambulatory Visit (HOSPITAL_COMMUNITY)
Admission: RE | Admit: 2013-04-09 | Discharge: 2013-04-09 | Disposition: A | Discharge status: Home / Self Care | Payer: Medicare Other | Source: Ambulatory Visit | Attending: Cardiovascular Disease | Admitting: Cardiovascular Disease

## 2013-04-09 DIAGNOSIS — R011 Cardiac murmur, unspecified: Secondary | ICD-10-CM | POA: Diagnosis not present

## 2013-04-09 DIAGNOSIS — I10 Essential (primary) hypertension: Secondary | ICD-10-CM | POA: Diagnosis not present

## 2013-04-09 NOTE — Progress Notes (Signed)
2D Echo Performed 04/09/2013    Emberlyn Burlison, RCS  

## 2013-04-17 DIAGNOSIS — J309 Allergic rhinitis, unspecified: Secondary | ICD-10-CM | POA: Diagnosis not present

## 2013-04-24 DIAGNOSIS — J309 Allergic rhinitis, unspecified: Secondary | ICD-10-CM | POA: Diagnosis not present

## 2013-05-02 ENCOUNTER — Ambulatory Visit: Payer: Medicare Other | Admitting: Internal Medicine

## 2013-05-07 DIAGNOSIS — J45909 Unspecified asthma, uncomplicated: Secondary | ICD-10-CM | POA: Diagnosis not present

## 2013-05-07 DIAGNOSIS — J3089 Other allergic rhinitis: Secondary | ICD-10-CM | POA: Diagnosis not present

## 2013-05-07 DIAGNOSIS — J45901 Unspecified asthma with (acute) exacerbation: Secondary | ICD-10-CM | POA: Diagnosis not present

## 2013-05-07 DIAGNOSIS — H1045 Other chronic allergic conjunctivitis: Secondary | ICD-10-CM | POA: Diagnosis not present

## 2013-05-08 ENCOUNTER — Encounter: Payer: Self-pay | Admitting: Internal Medicine

## 2013-05-08 ENCOUNTER — Ambulatory Visit (INDEPENDENT_AMBULATORY_CARE_PROVIDER_SITE_OTHER): Payer: Medicare Other | Admitting: Internal Medicine

## 2013-05-08 VITALS — BP 100/70 | HR 80 | Ht 63.5 in | Wt 115.1 lb

## 2013-05-08 DIAGNOSIS — K219 Gastro-esophageal reflux disease without esophagitis: Secondary | ICD-10-CM

## 2013-05-08 MED ORDER — DEXLANSOPRAZOLE 60 MG PO CPDR: 60.0000 mg | DELAYED_RELEASE_CAPSULE | Freq: Every day | ORAL | Status: DC

## 2013-05-08 NOTE — Patient Instructions (Addendum)
We have given you some samples of Dexilant  Please follow up with Dr. Marina Goodell as needed

## 2013-05-08 NOTE — Progress Notes (Addendum)
HISTORY OF PRESENT ILLNESS:  Madison Kramer is a 67 y.o. female with hypertension, hyperlipidemia, back pain, and GERD. She was evaluated in December 2013 with a chief complaint of thick mucus in the pharynx as well as chronic throat clearing behavior. Upper endoscopy on 11/23/2012 was normal. Etiology of her symptoms were unclear. In part, possibly reflux. Other etiologies such as ENT, allergies, and functional entertained. She seems to have some response to PPIs for which she has taken a course. More recently, on demand use. Still with similar varying complaints. Loss of questions from her today regarding her symptoms and treatment strategies. She does report stress. Her weight is stable. No new complaints.  REVIEW OF SYSTEMS:  All non-GI ROS negative except for sinus and allergy trouble, arthritis, back pain, sleeping problems  Past Medical History  Diagnosis Date  . Lichen sclerosus et atrophicus of the vulva   . Osteopenia   . Atrophic vaginitis   . Elevated cholesterol   . Hypertension   . Fibroid     SUBMUCOUS MYOMA  . Back pain   . Asthma   . Allergic rhinitis   . Diverticulosis   . Lactose intolerance   . GERD (gastroesophageal reflux disease)   . Arthritis     Past Surgical History  Procedure Laterality Date  . Breast surgery  2001    REDUCTION MAMMAPLASTY  . Tubal ligation  1987  . Inguinal hernia repair  1971    BILATERAL  . Hysteroscopy  2003    HYSTEROSCOPIC RESECTION OF MYOMA  . Foot surgery  2009  . Tonsillectomy    . Mandible surgery    . Myomectomy    . Colonoscopy      Social History Madison Kramer  reports that she quit smoking about 24 years ago. Her smoking use included Cigarettes. She smoked 0.00 packs per day for 20 years. She has never used smokeless tobacco. She reports that she drinks about 5.0 ounces of alcohol per week. She reports that she does not use illicit drugs.  family history includes Breast cancer in her maternal aunt; Heart disease  in her maternal uncle and mother; Hypertension in her mother; and Multiple myeloma in her father.  Allergies  Allergen Reactions  . Codeine   . Erythromycin   . Sulfonamide Derivatives   . Tramadol        PHYSICAL EXAMINATION: Vital signs: BP 100/70  Pulse 80  Ht 5' 3.5" (1.613 m)  Wt 115 lb 2 oz (52.22 kg)  BMI 20.07 kg/m2 General: Well-developed, well-nourished, no acute distress Abdomen: Not reexamined Psychiatric: alert and oriented x3. Cooperative   ASSESSMENT:  #1. Chronic pharyngeal complaints of uncertain etiology. Possible component to some symptoms is reflux. Suspect functional component. #2. Diverticulosis on screening colonoscopy 2008 #3. Health related anxiety   PLAN:  #1. Reflux precautions #2. Okay to use PPI on demand. Dexilant samples given #3. Encouraged to talk to PCP about stress management. Antidepressant therapy may be helpful for symptoms that have a functional basis, as suspected here. GI followup as needed #4. Surveillance colonoscopy 2018

## 2013-05-14 DIAGNOSIS — J3089 Other allergic rhinitis: Secondary | ICD-10-CM | POA: Diagnosis not present

## 2013-05-14 DIAGNOSIS — K219 Gastro-esophageal reflux disease without esophagitis: Secondary | ICD-10-CM | POA: Diagnosis not present

## 2013-05-14 DIAGNOSIS — J45909 Unspecified asthma, uncomplicated: Secondary | ICD-10-CM | POA: Diagnosis not present

## 2013-05-14 DIAGNOSIS — H1045 Other chronic allergic conjunctivitis: Secondary | ICD-10-CM | POA: Diagnosis not present

## 2013-05-20 DIAGNOSIS — J309 Allergic rhinitis, unspecified: Secondary | ICD-10-CM | POA: Diagnosis not present

## 2013-05-23 DIAGNOSIS — J309 Allergic rhinitis, unspecified: Secondary | ICD-10-CM | POA: Diagnosis not present

## 2013-05-29 DIAGNOSIS — J309 Allergic rhinitis, unspecified: Secondary | ICD-10-CM | POA: Diagnosis not present

## 2013-05-30 ENCOUNTER — Telehealth: Payer: Self-pay | Admitting: Internal Medicine

## 2013-05-30 NOTE — Telephone Encounter (Signed)
Spoke with pt and she is aware.

## 2013-05-30 NOTE — Telephone Encounter (Signed)
Pt states that she is having IBS symptoms, reports she is having several diarrhea stools/day, no blood in them. States she is having a lot of bloating. She is taking a probiotic daily but wants to know if there is something else she could take. Reports she is going out of town in about . She will be in [place]. Dr. Jarold Motto as doc of the day please advise.

## 2013-05-30 NOTE — Telephone Encounter (Signed)
Prn imodium and ov followuop

## 2013-06-04 ENCOUNTER — Telehealth: Payer: Self-pay | Admitting: Cardiovascular Disease

## 2013-06-04 DIAGNOSIS — J309 Allergic rhinitis, unspecified: Secondary | ICD-10-CM | POA: Diagnosis not present

## 2013-06-04 NOTE — Telephone Encounter (Signed)
Dr. [name] wanted her to go off of Simvastatin and go on liptor . Needs a new prescription sent to pharmacy for your generic Liptor she think it was 40mg     Thanks

## 2013-06-05 MED ORDER — ATORVASTATIN CALCIUM 40 MG PO TABS: 40.0000 mg | ORAL_TABLET | Freq: Every day | ORAL | Status: DC

## 2013-06-05 NOTE — Telephone Encounter (Signed)
Returned call.  Pt stated Dr. [name] was supposed to change simvastatin to the generic for Lipitor.  Reviewed chart and based on last lipid check, Dr. [name] wanted to start Lipitor 40mg  daily and recheck lab in 8 weeks.  Pt informed and verbalized understanding.  Rx sent to pharmacy for atorvastatin 40mg  daily.  Lab order mailed to pt.  Pt verbalized understanding and agreed w/ plan.

## 2013-06-06 ENCOUNTER — Other Ambulatory Visit: Payer: Self-pay

## 2013-06-06 MED ORDER — LOSARTAN POTASSIUM 50 MG PO TABS: 50.0000 mg | ORAL_TABLET | Freq: Every day | ORAL | Status: DC

## 2013-06-06 NOTE — Telephone Encounter (Signed)
Rx was sent to pharmacy electronically via [employer].  

## 2013-06-07 DIAGNOSIS — M5137 Other intervertebral disc degeneration, lumbosacral region: Secondary | ICD-10-CM | POA: Diagnosis not present

## 2013-06-07 DIAGNOSIS — M47817 Spondylosis without myelopathy or radiculopathy, lumbosacral region: Secondary | ICD-10-CM | POA: Diagnosis not present

## 2013-06-07 DIAGNOSIS — M4310 Spondylolisthesis, site unspecified: Secondary | ICD-10-CM | POA: Diagnosis not present

## 2013-06-07 DIAGNOSIS — M5126 Other intervertebral disc displacement, lumbar region: Secondary | ICD-10-CM | POA: Diagnosis not present

## 2013-06-11 DIAGNOSIS — J309 Allergic rhinitis, unspecified: Secondary | ICD-10-CM | POA: Diagnosis not present

## 2013-06-13 DIAGNOSIS — L821 Other seborrheic keratosis: Secondary | ICD-10-CM | POA: Diagnosis not present

## 2013-06-13 DIAGNOSIS — E65 Localized adiposity: Secondary | ICD-10-CM | POA: Diagnosis not present

## 2013-06-18 DIAGNOSIS — J309 Allergic rhinitis, unspecified: Secondary | ICD-10-CM | POA: Diagnosis not present

## 2013-07-03 DIAGNOSIS — J309 Allergic rhinitis, unspecified: Secondary | ICD-10-CM | POA: Diagnosis not present

## 2013-07-16 DIAGNOSIS — J309 Allergic rhinitis, unspecified: Secondary | ICD-10-CM | POA: Diagnosis not present

## 2013-07-18 DIAGNOSIS — J309 Allergic rhinitis, unspecified: Secondary | ICD-10-CM | POA: Diagnosis not present

## 2013-07-24 DIAGNOSIS — J309 Allergic rhinitis, unspecified: Secondary | ICD-10-CM | POA: Diagnosis not present

## 2013-08-01 ENCOUNTER — Telehealth: Payer: Self-pay | Admitting: Cardiovascular Disease

## 2013-08-01 DIAGNOSIS — J309 Allergic rhinitis, unspecified: Secondary | ICD-10-CM | POA: Diagnosis not present

## 2013-08-01 NOTE — Telephone Encounter (Signed)
Pt was wanting to get her lab work done on Monday and misplaced her lab slip and wanted to know what to do.

## 2013-08-01 NOTE — Telephone Encounter (Signed)
Returned call.  Pt stated she found the lab slip.  Advised she be fasting for labs.  Pt verbalized understanding and agreed w/ plan.

## 2013-08-05 ENCOUNTER — Telehealth: Payer: Self-pay | Admitting: *Deleted

## 2013-08-05 NOTE — Telephone Encounter (Signed)
Pt asking if it's okay to drink 2 glasses of water before checking lipids.  Pt informed she should have enough water to take morning meds before test.  Pt stated she is a mouth breather and there will always be water.  Pt advised to have labs done.  Pt verbalized understanding and agreed w/ plan.

## 2013-08-06 ENCOUNTER — Other Ambulatory Visit: Payer: Self-pay | Admitting: Cardiovascular Disease

## 2013-08-06 DIAGNOSIS — E782 Mixed hyperlipidemia: Secondary | ICD-10-CM | POA: Diagnosis not present

## 2013-08-06 LAB — LIPID PANEL: Cholesterol: 197 mg/dL (ref 0–200)

## 2013-08-07 ENCOUNTER — Other Ambulatory Visit: Payer: Self-pay | Admitting: Dermatology

## 2013-08-07 DIAGNOSIS — D239 Other benign neoplasm of skin, unspecified: Secondary | ICD-10-CM | POA: Diagnosis not present

## 2013-08-07 DIAGNOSIS — D485 Neoplasm of uncertain behavior of skin: Secondary | ICD-10-CM | POA: Diagnosis not present

## 2013-08-07 DIAGNOSIS — B079 Viral wart, unspecified: Secondary | ICD-10-CM | POA: Diagnosis not present

## 2013-08-07 DIAGNOSIS — L821 Other seborrheic keratosis: Secondary | ICD-10-CM | POA: Diagnosis not present

## 2013-08-07 DIAGNOSIS — L820 Inflamed seborrheic keratosis: Secondary | ICD-10-CM | POA: Diagnosis not present

## 2013-08-07 DIAGNOSIS — B070 Plantar wart: Secondary | ICD-10-CM | POA: Diagnosis not present

## 2013-08-13 DIAGNOSIS — J309 Allergic rhinitis, unspecified: Secondary | ICD-10-CM | POA: Diagnosis not present

## 2013-08-20 ENCOUNTER — Other Ambulatory Visit: Payer: Self-pay | Admitting: Neurosurgery

## 2013-08-20 ENCOUNTER — Ambulatory Visit
Admission: RE | Admit: 2013-08-20 | Discharge: 2013-08-20 | Disposition: A | Discharge status: Home / Self Care | Payer: Medicare Other | Source: Ambulatory Visit | Attending: Neurosurgery | Admitting: Neurosurgery

## 2013-08-20 DIAGNOSIS — M545 Low back pain: Secondary | ICD-10-CM

## 2013-08-20 DIAGNOSIS — M47817 Spondylosis without myelopathy or radiculopathy, lumbosacral region: Secondary | ICD-10-CM | POA: Diagnosis not present

## 2013-08-20 DIAGNOSIS — M5137 Other intervertebral disc degeneration, lumbosacral region: Secondary | ICD-10-CM | POA: Diagnosis not present

## 2013-08-20 DIAGNOSIS — M4310 Spondylolisthesis, site unspecified: Secondary | ICD-10-CM | POA: Diagnosis not present

## 2013-08-20 DIAGNOSIS — M5126 Other intervertebral disc displacement, lumbar region: Secondary | ICD-10-CM | POA: Diagnosis not present

## 2013-08-20 DIAGNOSIS — M48061 Spinal stenosis, lumbar region without neurogenic claudication: Secondary | ICD-10-CM | POA: Diagnosis not present

## 2013-08-21 ENCOUNTER — Other Ambulatory Visit: Payer: Self-pay | Admitting: Neurosurgery

## 2013-08-21 DIAGNOSIS — M47817 Spondylosis without myelopathy or radiculopathy, lumbosacral region: Secondary | ICD-10-CM | POA: Diagnosis not present

## 2013-08-21 DIAGNOSIS — IMO0002 Reserved for concepts with insufficient information to code with codable children: Secondary | ICD-10-CM

## 2013-08-21 DIAGNOSIS — M4310 Spondylolisthesis, site unspecified: Secondary | ICD-10-CM | POA: Diagnosis not present

## 2013-08-21 DIAGNOSIS — M5137 Other intervertebral disc degeneration, lumbosacral region: Secondary | ICD-10-CM | POA: Diagnosis not present

## 2013-08-21 DIAGNOSIS — M5126 Other intervertebral disc displacement, lumbar region: Secondary | ICD-10-CM | POA: Diagnosis not present

## 2013-08-26 ENCOUNTER — Other Ambulatory Visit: Payer: Self-pay | Admitting: Neurosurgery

## 2013-08-26 DIAGNOSIS — M47817 Spondylosis without myelopathy or radiculopathy, lumbosacral region: Secondary | ICD-10-CM | POA: Diagnosis not present

## 2013-08-26 DIAGNOSIS — M5126 Other intervertebral disc displacement, lumbar region: Secondary | ICD-10-CM | POA: Diagnosis not present

## 2013-08-26 DIAGNOSIS — M5137 Other intervertebral disc degeneration, lumbosacral region: Secondary | ICD-10-CM | POA: Diagnosis not present

## 2013-08-26 DIAGNOSIS — M4310 Spondylolisthesis, site unspecified: Secondary | ICD-10-CM | POA: Diagnosis not present

## 2013-08-27 ENCOUNTER — Encounter (HOSPITAL_COMMUNITY): Payer: Self-pay | Admitting: Pharmacy Technician

## 2013-08-28 ENCOUNTER — Encounter (HOSPITAL_COMMUNITY)
Admission: RE | Admit: 2013-08-28 | Discharge: 2013-08-28 | Disposition: A | Discharge status: Home / Self Care | Payer: Medicare Other | Source: Ambulatory Visit | Attending: Neurosurgery | Admitting: Neurosurgery

## 2013-08-28 ENCOUNTER — Ambulatory Visit (HOSPITAL_COMMUNITY)
Admission: RE | Admit: 2013-08-28 | Discharge: 2013-08-28 | Disposition: A | Discharge status: Home / Self Care | Payer: Medicare Other | Source: Ambulatory Visit | Attending: Anesthesiology | Admitting: Anesthesiology

## 2013-08-28 ENCOUNTER — Encounter (HOSPITAL_COMMUNITY): Payer: Self-pay

## 2013-08-28 DIAGNOSIS — K219 Gastro-esophageal reflux disease without esophagitis: Secondary | ICD-10-CM | POA: Diagnosis not present

## 2013-08-28 DIAGNOSIS — I10 Essential (primary) hypertension: Secondary | ICD-10-CM | POA: Diagnosis not present

## 2013-08-28 DIAGNOSIS — I498 Other specified cardiac arrhythmias: Secondary | ICD-10-CM | POA: Insufficient documentation

## 2013-08-28 DIAGNOSIS — E7800 Pure hypercholesterolemia, unspecified: Secondary | ICD-10-CM | POA: Diagnosis not present

## 2013-08-28 DIAGNOSIS — J45909 Unspecified asthma, uncomplicated: Secondary | ICD-10-CM | POA: Insufficient documentation

## 2013-08-28 DIAGNOSIS — J9819 Other pulmonary collapse: Secondary | ICD-10-CM | POA: Diagnosis not present

## 2013-08-28 DIAGNOSIS — Z01818 Encounter for other preprocedural examination: Secondary | ICD-10-CM | POA: Diagnosis not present

## 2013-08-28 HISTORY — DX: Pneumonia, unspecified organism: J18.9

## 2013-08-28 HISTORY — DX: Anxiety disorder, unspecified: F41.9

## 2013-08-28 HISTORY — DX: Unspecified cataract: H26.9

## 2013-08-28 LAB — CBC
HCT: 44.9 % (ref 36.0–46.0)
MCV: 90.5 fL (ref 78.0–100.0)
Platelets: 338 10*3/uL (ref 150–400)
RBC: 4.96 MIL/uL (ref 3.87–5.11)
WBC: 10.7 10*3/uL — ABNORMAL HIGH (ref 4.0–10.5)

## 2013-08-28 LAB — BASIC METABOLIC PANEL
CO2: 27 mEq/L (ref 19–32)
Chloride: 94 mEq/L — ABNORMAL LOW (ref 96–112)
GFR calc Af Amer: >90 mL/min (ref >90)
Potassium: 4.2 mEq/L (ref 3.5–5.1)
Sodium: 132 mEq/L — ABNORMAL LOW (ref 135–145)

## 2013-08-28 LAB — SURGICAL PCR SCREEN
MRSA, PCR: NEGATIVE
Staphylococcus aureus: POSITIVE — AB

## 2013-08-28 NOTE — Progress Notes (Signed)
Pt chart left for [place], [place] (anesthesia) to review pt chest x ray dated 08/28/16 and pt EKG ( in chart dated 04/09/13).

## 2013-08-28 NOTE — Progress Notes (Signed)
Pt denies SOB and chest pain. Pt states that she is currently under the care of Dr [name] ( cardiologist ) at [facility]. Pt denies having a chest x ray within the last year. Pt states that she had an EKG and stress test at [facility]; results were requested via telephone. Pt denies ever having a cardiac cath.

## 2013-08-28 NOTE — Pre-Procedure Instructions (Signed)
Madison Kramer  08/28/2013   Your procedure is scheduled on:  Friday, August 30, 2013  Report to [facility] at 9:30 AM.  Call this number if you have problems the morning of surgery: [phone]   Remember:   Do not eat food or drink liquids after midnight.   Take these medicines the morning of surgery with A SIP OF WATER: beclomethasone (QVAR) 80 MCG/ACT inhaler  If needed: busPIRone (BUSPAR) 5 MG tablet ( for anxiety)  Stop taking Aspirin, Multivitamins, and herbal medications (Misc Natural Products (TUMERSAID PO), Probiotic Product  (PROBIOTIC DAILY PO)   Do not take any NSAIDs ie: Ibuprofen, Advil, Naproxen or any medication containing Aspirin.  Do not wear jewelry, make-up or nail polish.  Do not wear lotions, powders, or perfumes. You may wear deodorant.  Do not shave 48 hours prior to surgery  Do not bring valuables to the hospital.  [facility] is not responsible for any belongings or valuables.  Contacts, dentures or bridgework may not be worn into surgery.  Leave suitcase in the car. After surgery it may be brought to your room.  For patients admitted to the hospital, checkout time is 11:00 AM the day of discharge.   Patients discharged the day of surgery will not be allowed to drive home.  Name and phone number of your driver:   Special Instructions: Shower using CHG 2 nights before surgery and the night before surgery.  If you shower the day of surgery use CHG.  Use special wash - you have one bottle of CHG for all showers.  You should use approximately 1/3 of the bottle for each shower.   Please read over the following fact sheets that you were given: Pain Booklet, Coughing and Deep Breathing, MRSA Information and Surgical Site Infection Prevention

## 2013-08-29 ENCOUNTER — Encounter (HOSPITAL_COMMUNITY): Payer: Self-pay

## 2013-08-29 MED ORDER — CEFAZOLIN SODIUM-DEXTROSE 2-3 GM-% IV SOLR
2.0000 g | INTRAVENOUS | Status: AC
  Administered 2013-08-30: 2 g via INTRAVENOUS
  Filled 2013-08-29: qty 50

## 2013-08-29 NOTE — Progress Notes (Signed)
Anesthesia chart review: Patient is a 67 year old female scheduled for right L4-5 extraforaminal discectomy on 08/30/2013 by Dr. Newell Coral. History includes former smoker, hypercholesterolemia, hypertension, asthma, GERD, anxiety, arthritis, pneumonia, cataracts, diverticulosis, fibroids, allergic rhinitis, lactose intolerance, mandibular surger, tonsillectomy, inguinal hernia repair, breast reduction. PCP is Dr. Kirby Funk.  Cardiologist is Dr. [name], last visit 03/15/13.  According to his note, "Should she require any lumbosacral surgery with Dr. Newell Coral that would be okay from a cardiac standpoint."  Echo on 04/09/13 showed: - Left ventricle: The cavity size was normal. Wall thickness was normal. Systolic function was normal. The estimated ejection fraction was in the range of 55% to 60%. Wall motion was normal; there were no regional wall motion abnormalities. Doppler parameters are consistent with abnormal left ventricular relaxation (grade 1 diastolic dysfunction). - Mitral valve: Trivial regurgitation. Valve area by pressure half-time: 1.73cm^2. No evidence of stenosis. - Atrial septum: No defect or patent foramen ovale was identified. - Tricuspid valve: Mild regurgitation. Impressions: No significant abnormalities for age.  Nuclear stress test on 02/16/10 [facility]) showed: Abnormal pharmacologic nuclear stress test. Preserved the day most likely represents breast attenuation, but without resting images, anterior wall ischemia cannot be excluded. Stress EF 74%. Global LV systolic function is normal. Exercise capacity and. EKG negative for ischemia. Clinical correlation recommended as well as stress with resting images for comparison.  Patient was asymptomatic and felt clinically stable, so he ultimately felt stress test was "OK." (Of note, Dr. [name] mentions a negative Myoview in 04/2011, but [facility] medical records report that the last stress test found is from 02/16/10.)   EKG on 03/15/13 showed  SR with sinus arrhythmia, septal infarct (age undetermined).   CXR on 08/28/13 showed:  1. No acute cardiopulmonary process. 2. Chronic lingular atelectasis.  Preoperative labs noted.  Anticipate that she can proceed as planned.  Velna Ochs [facility]/Anesthesiology Phone [phone] 08/29/2013 10:19 AM

## 2013-08-30 ENCOUNTER — Ambulatory Visit (HOSPITAL_COMMUNITY): Payer: Medicare Other

## 2013-08-30 ENCOUNTER — Encounter (HOSPITAL_COMMUNITY): Payer: Self-pay | Admitting: Vascular Surgery

## 2013-08-30 ENCOUNTER — Observation Stay (HOSPITAL_COMMUNITY)
Admission: RE | Admit: 2013-08-30 | Discharge: 2013-08-31 | Disposition: A | Discharge status: Home / Self Care | Payer: Medicare Other | Source: Ambulatory Visit | Attending: Neurosurgery | Admitting: Neurosurgery

## 2013-08-30 ENCOUNTER — Encounter (HOSPITAL_COMMUNITY): Payer: Self-pay | Admitting: *Deleted

## 2013-08-30 ENCOUNTER — Ambulatory Visit (HOSPITAL_COMMUNITY): Payer: Medicare Other | Admitting: Certified Registered"

## 2013-08-30 ENCOUNTER — Encounter (HOSPITAL_COMMUNITY)
Admission: RE | Disposition: A | Discharge status: Home / Self Care | Payer: Self-pay | Source: Ambulatory Visit | Attending: Neurosurgery

## 2013-08-30 DIAGNOSIS — M47817 Spondylosis without myelopathy or radiculopathy, lumbosacral region: Secondary | ICD-10-CM | POA: Insufficient documentation

## 2013-08-30 DIAGNOSIS — M4310 Spondylolisthesis, site unspecified: Principal | ICD-10-CM | POA: Insufficient documentation

## 2013-08-30 DIAGNOSIS — M5126 Other intervertebral disc displacement, lumbar region: Secondary | ICD-10-CM | POA: Insufficient documentation

## 2013-08-30 DIAGNOSIS — I10 Essential (primary) hypertension: Secondary | ICD-10-CM | POA: Insufficient documentation

## 2013-08-30 DIAGNOSIS — M4712 Other spondylosis with myelopathy, cervical region: Secondary | ICD-10-CM | POA: Diagnosis not present

## 2013-08-30 DIAGNOSIS — Z79899 Other long term (current) drug therapy: Secondary | ICD-10-CM | POA: Insufficient documentation

## 2013-08-30 DIAGNOSIS — M5137 Other intervertebral disc degeneration, lumbosacral region: Secondary | ICD-10-CM | POA: Diagnosis not present

## 2013-08-30 DIAGNOSIS — E785 Hyperlipidemia, unspecified: Secondary | ICD-10-CM | POA: Diagnosis not present

## 2013-08-30 DIAGNOSIS — M51379 Other intervertebral disc degeneration, lumbosacral region without mention of lumbar back pain or lower extremity pain: Secondary | ICD-10-CM | POA: Insufficient documentation

## 2013-08-30 HISTORY — PX: LUMBAR LAMINECTOMY/DECOMPRESSION MICRODISCECTOMY: SHX5026

## 2013-08-30 SURGERY — LUMBAR LAMINECTOMY/DECOMPRESSION MICRODISCECTOMY 1 LEVEL
Anesthesia: General | Site: Back | Laterality: Right | Wound class: Clean

## 2013-08-30 MED ORDER — KETOROLAC TROMETHAMINE 30 MG/ML IJ SOLN
30.0000 mg | Freq: Once | INTRAMUSCULAR | Status: AC
  Administered 2013-08-30: 30 mg via INTRAVENOUS

## 2013-08-30 MED ORDER — OXYCODONE HCL 5 MG PO TABS
5.0000 mg | ORAL_TABLET | Freq: Once | ORAL | Status: DC | PRN
Start: 2013-08-30 — End: 2013-08-30

## 2013-08-30 MED ORDER — HYDROMORPHONE HCL PF 1 MG/ML IJ SOLN
0.2500 mg | INTRAMUSCULAR | Status: DC | PRN
  Administered 2013-08-30 (×2): 0.5 mg via INTRAVENOUS

## 2013-08-30 MED ORDER — BISACODYL 10 MG RE SUPP: 10.0000 mg | Freq: Every day | RECTAL | Status: DC | PRN

## 2013-08-30 MED ORDER — ARTIFICIAL TEARS OP OINT
TOPICAL_OINTMENT | OPHTHALMIC | Status: DC | PRN
  Administered 2013-08-30: 1 via OPHTHALMIC

## 2013-08-30 MED ORDER — ONDANSETRON HCL 4 MG/2ML IJ SOLN
INTRAMUSCULAR | Status: DC | PRN
  Administered 2013-08-30: 4 mg via INTRAVENOUS

## 2013-08-30 MED ORDER — KETOROLAC TROMETHAMINE 30 MG/ML IJ SOLN
INTRAMUSCULAR | Status: AC
  Filled 2013-08-30: qty 1

## 2013-08-30 MED ORDER — FENTANYL CITRATE 0.05 MG/ML IJ SOLN
INTRAMUSCULAR | Status: AC
  Filled 2013-08-30: qty 2

## 2013-08-30 MED ORDER — CYCLOBENZAPRINE HCL 10 MG PO TABS: 10.0000 mg | ORAL_TABLET | Freq: Three times a day (TID) | ORAL | Status: DC | PRN

## 2013-08-30 MED ORDER — BUSPIRONE HCL 5 MG PO TABS
5.0000 mg | ORAL_TABLET | Freq: Every day | ORAL | Status: DC | PRN
  Filled 2013-08-30: qty 1

## 2013-08-30 MED ORDER — OXYCODONE HCL 5 MG/5ML PO SOLN
5.0000 mg | Freq: Once | ORAL | Status: DC | PRN
Start: 2013-08-30 — End: 2013-08-30

## 2013-08-30 MED ORDER — SODIUM CHLORIDE 0.9 % IR SOLN
Status: DC | PRN
  Administered 2013-08-30: 15:00:00

## 2013-08-30 MED ORDER — THROMBIN 5000 UNITS EX SOLR
CUTANEOUS | Status: DC | PRN
  Administered 2013-08-30: 5000 [IU] via TOPICAL

## 2013-08-30 MED ORDER — LACTATED RINGERS IV SOLN
INTRAVENOUS | Status: DC
  Administered 2013-08-30 (×2): via INTRAVENOUS

## 2013-08-30 MED ORDER — ONDANSETRON HCL 4 MG/2ML IJ SOLN
4.0000 mg | Freq: Four times a day (QID) | INTRAMUSCULAR | Status: DC | PRN
Start: 2013-08-30 — End: 2013-08-31
  Administered 2013-08-30: 4 mg via INTRAVENOUS
  Filled 2013-08-30: qty 2

## 2013-08-30 MED ORDER — BUPIVACAINE HCL (PF) 0.5 % IJ SOLN
INTRAMUSCULAR | Status: DC | PRN
  Administered 2013-08-30: 10 mL

## 2013-08-30 MED ORDER — GLYCOPYRROLATE 0.2 MG/ML IJ SOLN
INTRAMUSCULAR | Status: DC | PRN
  Administered 2013-08-30: 0.4 mg via INTRAVENOUS

## 2013-08-30 MED ORDER — POTASSIUM CHLORIDE IN NACL 20-0.9 MEQ/L-% IV SOLN
INTRAVENOUS | Status: DC
  Filled 2013-08-30 (×3): qty 1000

## 2013-08-30 MED ORDER — DEXAMETHASONE SODIUM PHOSPHATE 4 MG/ML IJ SOLN
INTRAMUSCULAR | Status: DC | PRN
  Administered 2013-08-30: 8 mg via INTRAVENOUS

## 2013-08-30 MED ORDER — MORPHINE SULFATE 4 MG/ML IJ SOLN: 4.0000 mg | INTRAMUSCULAR | Status: DC | PRN

## 2013-08-30 MED ORDER — ACETAMINOPHEN 325 MG PO TABS: 650.0000 mg | ORAL_TABLET | ORAL | Status: DC | PRN

## 2013-08-30 MED ORDER — PHENOL 1.4 % MT LIQD: 1.0000 | OROMUCOSAL | Status: DC | PRN

## 2013-08-30 MED ORDER — OXYCODONE-ACETAMINOPHEN 5-325 MG PO TABS
1.0000 | ORAL_TABLET | ORAL | Status: DC | PRN
  Administered 2013-08-30: 2 via ORAL
  Filled 2013-08-30: qty 2

## 2013-08-30 MED ORDER — ACETAMINOPHEN 650 MG RE SUPP: 650.0000 mg | RECTAL | Status: DC | PRN

## 2013-08-30 MED ORDER — METHYLPREDNISOLONE ACETATE 80 MG/ML IJ SUSP
INTRAMUSCULAR | Status: DC | PRN
  Administered 2013-08-30: 80 mg via INTRA_ARTICULAR

## 2013-08-30 MED ORDER — HYDROXYZINE HCL 25 MG PO TABS
50.0000 mg | ORAL_TABLET | ORAL | Status: DC | PRN
  Administered 2013-08-30: 50 mg via ORAL
  Filled 2013-08-30: qty 2

## 2013-08-30 MED ORDER — MENTHOL 3 MG MT LOZG
1.0000 | LOZENGE | OROMUCOSAL | Status: DC | PRN
  Filled 2013-08-30: qty 9

## 2013-08-30 MED ORDER — PROPOFOL 10 MG/ML IV BOLUS
INTRAVENOUS | Status: DC | PRN
  Administered 2013-08-30: 50 mg via INTRAVENOUS
  Administered 2013-08-30: 150 mg via INTRAVENOUS

## 2013-08-30 MED ORDER — ROCURONIUM BROMIDE 100 MG/10ML IV SOLN
INTRAVENOUS | Status: DC | PRN
  Administered 2013-08-30: 10 mg via INTRAVENOUS
  Administered 2013-08-30: 40 mg via INTRAVENOUS
  Administered 2013-08-30: 10 mg via INTRAVENOUS

## 2013-08-30 MED ORDER — KETOROLAC TROMETHAMINE 30 MG/ML IJ SOLN
30.0000 mg | Freq: Four times a day (QID) | INTRAMUSCULAR | Status: DC
  Administered 2013-08-30 – 2013-08-31 (×3): 30 mg via INTRAVENOUS
  Filled 2013-08-30 (×6): qty 1

## 2013-08-30 MED ORDER — NEOSTIGMINE METHYLSULFATE 1 MG/ML IJ SOLN
INTRAMUSCULAR | Status: DC | PRN
  Administered 2013-08-30: 3 mg via INTRAVENOUS

## 2013-08-30 MED ORDER — SODIUM CHLORIDE 0.9 % IJ SOLN
3.0000 mL | Freq: Two times a day (BID) | INTRAMUSCULAR | Status: DC
  Administered 2013-08-30: 3 mL via INTRAVENOUS

## 2013-08-30 MED ORDER — HYDROMORPHONE HCL PF 1 MG/ML IJ SOLN
INTRAMUSCULAR | Status: AC
  Filled 2013-08-30: qty 1

## 2013-08-30 MED ORDER — LIDOCAINE-EPINEPHRINE 1 %-1:100000 IJ SOLN
INTRAMUSCULAR | Status: DC | PRN
  Administered 2013-08-30: 10 mL

## 2013-08-30 MED ORDER — SODIUM CHLORIDE 0.9 % IJ SOLN: 3.0000 mL | INTRAMUSCULAR | Status: DC | PRN

## 2013-08-30 MED ORDER — ZOLPIDEM TARTRATE 5 MG PO TABS: 5.0000 mg | ORAL_TABLET | Freq: Every evening | ORAL | Status: DC | PRN

## 2013-08-30 MED ORDER — LOSARTAN POTASSIUM 50 MG PO TABS
50.0000 mg | ORAL_TABLET | Freq: Every day | ORAL | Status: DC
  Administered 2013-08-30: 50 mg via ORAL
  Filled 2013-08-30 (×2): qty 1

## 2013-08-30 MED ORDER — LIDOCAINE HCL (CARDIAC) 20 MG/ML IV SOLN
INTRAVENOUS | Status: DC | PRN
  Administered 2013-08-30: 80 mg via INTRAVENOUS

## 2013-08-30 MED ORDER — FENTANYL CITRATE 0.05 MG/ML IJ SOLN
INTRAMUSCULAR | Status: DC | PRN
  Administered 2013-08-30 (×2): 100 ug via INTRAVENOUS
  Administered 2013-08-30: 50 ug via INTRAVENOUS
  Administered 2013-08-30: 100 ug via INTRAVENOUS

## 2013-08-30 MED ORDER — MAGNESIUM HYDROXIDE 400 MG/5ML PO SUSP: 30.0000 mL | Freq: Every day | ORAL | Status: DC | PRN

## 2013-08-30 MED ORDER — 0.9 % SODIUM CHLORIDE (POUR BTL) OPTIME
TOPICAL | Status: DC | PRN
  Administered 2013-08-30: 1000 mL

## 2013-08-30 MED ORDER — HYDROCODONE-ACETAMINOPHEN 5-325 MG PO TABS
1.0000 | ORAL_TABLET | ORAL | Status: DC | PRN
Start: 2013-08-30 — End: 2013-08-31
  Administered 2013-08-30: 2 via ORAL
  Filled 2013-08-30: qty 2

## 2013-08-30 MED ORDER — ALUM & MAG HYDROXIDE-SIMETH 200-200-20 MG/5ML PO SUSP: 30.0000 mL | Freq: Four times a day (QID) | ORAL | Status: DC | PRN

## 2013-08-30 MED ORDER — HEMOSTATIC AGENTS (NO CHARGE) OPTIME
TOPICAL | Status: DC | PRN
  Administered 2013-08-30: 1 via TOPICAL

## 2013-08-30 MED ORDER — MIDAZOLAM HCL 5 MG/5ML IJ SOLN
INTRAMUSCULAR | Status: DC | PRN
  Administered 2013-08-30: 2 mg via INTRAVENOUS

## 2013-08-30 MED ORDER — FENTANYL CITRATE 0.05 MG/ML IJ SOLN
INTRAMUSCULAR | Status: DC | PRN
  Administered 2013-08-30: 100 ug via INTRAVENOUS

## 2013-08-30 MED ORDER — FLUTICASONE PROPIONATE 50 MCG/ACT NA SUSP
2.0000 | Freq: Every day | NASAL | Status: DC
  Administered 2013-08-30: 2 via NASAL
  Filled 2013-08-30: qty 16

## 2013-08-30 MED ORDER — SODIUM CHLORIDE 0.9 % IV SOLN: 250.0000 mL | INTRAVENOUS | Status: DC

## 2013-08-30 MED ORDER — ATORVASTATIN CALCIUM 40 MG PO TABS
40.0000 mg | ORAL_TABLET | Freq: Every day | ORAL | Status: DC
  Administered 2013-08-30: 40 mg via ORAL
  Filled 2013-08-30 (×2): qty 1

## 2013-08-30 SURGICAL SUPPLY — 59 items
BAG DECANTER FOR FLEXI CONT (MISCELLANEOUS) ×2 IMPLANT
BENZOIN TINCTURE PRP APPL 2/3 (GAUZE/BANDAGES/DRESSINGS) IMPLANT
BLADE SURG ROTATE 9660 (MISCELLANEOUS) IMPLANT
BRUSH SCRUB EZ PLAIN DRY (MISCELLANEOUS) ×2 IMPLANT
BUR ACORN 6.0 ACORN (BURR) ×2 IMPLANT
BUR ACRON 5.0MM COATED (BURR) IMPLANT
BUR MATCHSTICK NEURO 3.0 LAGG (BURR) ×2 IMPLANT
CANISTER SUCTION 2500CC (MISCELLANEOUS) ×2 IMPLANT
CLOTH BEACON ORANGE TIMEOUT ST (SAFETY) IMPLANT
CONT SPEC 4OZ CLIKSEAL STRL BL (MISCELLANEOUS) ×2 IMPLANT
DERMABOND ADVANCED (GAUZE/BANDAGES/DRESSINGS) ×1
DERMABOND ADVANCED .7 DNX12 (GAUZE/BANDAGES/DRESSINGS) ×1 IMPLANT
DRAPE LAPAROTOMY 100X72X124 (DRAPES) ×2 IMPLANT
DRAPE MICROSCOPE ZEISS OPMI (DRAPES) ×2 IMPLANT
DRAPE POUCH INSTRU U-SHP 10X18 (DRAPES) ×2 IMPLANT
DRSG EMULSION OIL 3X3 NADH (GAUZE/BANDAGES/DRESSINGS) IMPLANT
ELECT REM PT RETURN 9FT ADLT (ELECTROSURGICAL) ×2
ELECTRODE REM PT RTRN 9FT ADLT (ELECTROSURGICAL) ×1 IMPLANT
GAUZE SPONGE 4X4 16PLY XRAY LF (GAUZE/BANDAGES/DRESSINGS) IMPLANT
GLOVE BIOGEL PI IND STRL 7.5 (GLOVE) ×2 IMPLANT
GLOVE BIOGEL PI IND STRL 8 (GLOVE) ×1 IMPLANT
GLOVE BIOGEL PI INDICATOR 7.5 (GLOVE) ×2
GLOVE BIOGEL PI INDICATOR 8 (GLOVE) ×1
GLOVE ECLIPSE 7.5 STRL STRAW (GLOVE) ×2 IMPLANT
GLOVE EXAM NITRILE LRG STRL (GLOVE) IMPLANT
GLOVE EXAM NITRILE MD LF STRL (GLOVE) IMPLANT
GLOVE EXAM NITRILE XL STR (GLOVE) IMPLANT
GLOVE EXAM NITRILE XS STR PU (GLOVE) IMPLANT
GLOVE SS BIOGEL STRL SZ 6.5 (GLOVE) ×2 IMPLANT
GLOVE SUPERSENSE BIOGEL SZ 6.5 (GLOVE) ×2
GOWN BRE IMP SLV AUR LG STRL (GOWN DISPOSABLE) ×4 IMPLANT
GOWN BRE IMP SLV AUR XL STRL (GOWN DISPOSABLE) ×2 IMPLANT
GOWN STRL REIN 2XL LVL4 (GOWN DISPOSABLE) IMPLANT
KIT BASIN OR (CUSTOM PROCEDURE TRAY) ×2 IMPLANT
KIT ROOM TURNOVER OR (KITS) ×2 IMPLANT
NEEDLE HYPO 18GX1.5 BLUNT FILL (NEEDLE) ×2 IMPLANT
NEEDLE SPNL 18GX3.5 QUINCKE PK (NEEDLE) ×2 IMPLANT
NEEDLE SPNL 22GX3.5 QUINCKE BK (NEEDLE) ×2 IMPLANT
NS IRRIG 1000ML POUR BTL (IV SOLUTION) ×2 IMPLANT
PACK LAMINECTOMY NEURO (CUSTOM PROCEDURE TRAY) ×2 IMPLANT
PAD ARMBOARD 7.5X6 YLW CONV (MISCELLANEOUS) ×6 IMPLANT
PATTIES SURGICAL .5 X.5 (GAUZE/BANDAGES/DRESSINGS) ×2 IMPLANT
PATTIES SURGICAL .5 X1 (DISPOSABLE) ×2 IMPLANT
PATTIES SURGICAL 1X1 (DISPOSABLE) ×2 IMPLANT
RUBBERBAND STERILE (MISCELLANEOUS) ×4 IMPLANT
SPONGE GAUZE 4X4 12PLY (GAUZE/BANDAGES/DRESSINGS) IMPLANT
SPONGE LAP 4X18 X RAY DECT (DISPOSABLE) IMPLANT
SPONGE SURGIFOAM ABS GEL SZ50 (HEMOSTASIS) ×2 IMPLANT
STRIP CLOSURE SKIN 1/2X4 (GAUZE/BANDAGES/DRESSINGS) IMPLANT
SUT PROLENE 6 0 BV (SUTURE) IMPLANT
SUT VIC AB 1 CT1 18XBRD ANBCTR (SUTURE) ×1 IMPLANT
SUT VIC AB 1 CT1 8-18 (SUTURE) ×1
SUT VIC AB 2-0 CP2 18 (SUTURE) ×2 IMPLANT
SUT VIC AB 3-0 SH 8-18 (SUTURE) ×2 IMPLANT
SYR 20ML ECCENTRIC (SYRINGE) ×2 IMPLANT
SYR 5ML LL (SYRINGE) ×2 IMPLANT
TOWEL OR 17X24 6PK STRL BLUE (TOWEL DISPOSABLE) ×2 IMPLANT
TOWEL OR 17X26 10 PK STRL BLUE (TOWEL DISPOSABLE) ×2 IMPLANT
WATER STERILE IRR 1000ML POUR (IV SOLUTION) ×2 IMPLANT

## 2013-08-30 NOTE — Preoperative (Signed)
Beta Blockers   Reason not to administer Beta Blockers:Not Applicable 

## 2013-08-30 NOTE — Anesthesia Preprocedure Evaluation (Addendum)
Anesthesia Evaluation  Patient identified by MRN, date of birth, ID band Patient awake    Airway Mallampati: I  Neck ROM: Full    Dental  (+) Teeth Intact   Pulmonary asthma ,  Plm eosinophilia breath sounds clear to auscultation        Cardiovascular hypertension, Rhythm:Regular Rate:Normal     Neuro/Psych negative neurological ROS     GI/Hepatic Neg liver ROS, GERD-  ,  Endo/Other  negative endocrine ROS  Renal/GU negative Renal ROS     Musculoskeletal negative musculoskeletal ROS (+)   Abdominal   Peds  Hematology negative hematology ROS (+)   Anesthesia Other Findings   Reproductive/Obstetrics                          Anesthesia Physical Anesthesia Plan  ASA: II  Anesthesia Plan: General   Post-op Pain Management:    Induction: Intravenous  Airway Management Planned: Oral ETT  Additional Equipment:   Intra-op Plan:   Post-operative Plan: Extubation in OR  Informed Consent: I have reviewed the patients History and Physical, chart, labs and discussed the procedure including the risks, benefits and alternatives for the proposed anesthesia with the patient or authorized representative who has indicated his/her understanding and acceptance.   Dental advisory given  Plan Discussed with: CRNA and Surgeon  Anesthesia Plan Comments:         Anesthesia Quick Evaluation

## 2013-08-30 NOTE — H&P (Signed)
Subjective: Patient is a 67 y.o. female who is admitted for treatment of right L4-L5 extra foraminal lumbar disc herniation. Patient has been having increasingly disabling right lumbar radicular pain.she had an acute exacerbation about 3 weeks ago after beginning a new yoga program.  MRI was repeated and did not showsignificant change but did show persistence of a large right L4-L5 at time of this dictation.  Patient was treated with a steroid dose pack without relief.  Repeat examination shows interval development of weakness in the proximal right lower extremity.  Because of increasing lower extremity weakness and persistent disabling pain she is admitted now for a right L4-L5 extraforaminal microdiscectomy.   Patient Active Problem List   Diagnosis Date Noted  . Hypertension   . Back pain   . Extrinsic asthma 06/16/2012  . Fibroid   . Lichen sclerosus et atrophicus of the vulva   . Osteopenia   . Atrophic vaginitis   . CHRONIC RHINITIS 11/23/2009  . PULMONARY INFILTRATE INCLUDES (EOSINOPHILIA) 10/27/2009  . BRONCHITIS, ACUTE 10/23/2009  . HYPERLIPIDEMIA 12/22/2008  . Cough 12/22/2008  . HEMOPTYSIS 12/22/2008   Past Medical History  Diagnosis Date  . Lichen sclerosus et atrophicus of the vulva   . Osteopenia   . Atrophic vaginitis   . Elevated cholesterol   . Hypertension   . Fibroid     SUBMUCOUS MYOMA  . Back pain   . Asthma   . Allergic rhinitis   . Diverticulosis   . Lactose intolerance   . GERD (gastroesophageal reflux disease)   . Arthritis   . Cataract     Hx; of  . Anxiety   . Pneumonia     Past Surgical History  Procedure Laterality Date  . Breast surgery  2001    REDUCTION MAMMAPLASTY  . Tubal ligation  1987  . Inguinal hernia repair  1971    BILATERAL  . Hysteroscopy  2003    HYSTEROSCOPIC RESECTION OF MYOMA  . Foot surgery  2009  . Tonsillectomy    . Mandible surgery    . Myomectomy    . Colonoscopy      Prescriptions prior to admission   Medication Sig Dispense Refill  . aspirin EC 81 MG tablet Take 81 mg by mouth daily.      Marland Kitchen atorvastatin (LIPITOR) 40 MG tablet Take 40 mg by mouth daily.      . beclomethasone (QVAR) 80 MCG/ACT inhaler Inhale 1 puff into the lungs 2 (two) times daily.      . busPIRone (BUSPAR) 5 MG tablet Take 5 mg by mouth daily as needed (for anxiety).      . Calcium Carbonate-Vitamin D (CALCIUM + D PO) Take 1 tablet by mouth daily.       . eszopiclone (LUNESTA) 1 MG TABS tablet Take 1 mg by mouth at bedtime as needed (for sleep). Take immediately before bedtime      . fluticasone (FLONASE) 50 MCG/ACT nasal spray Place 2 sprays into the nose at bedtime.      Marland Kitchen losartan (COZAAR) 50 MG tablet Take 50 mg by mouth daily.      . Magnesium 250 MG TABS Take 250 mg by mouth daily.      . Misc Natural Products (TUMERSAID PO) Take 1 tablet by mouth daily.      . Multiple Vitamin (MULTIVITAMIN) capsule Take 1 capsule by mouth daily.        . Probiotic Product (PROBIOTIC DAILY PO) Take 1 tablet by mouth daily.      Marland Kitchen  zaleplon (SONATA) 5 MG capsule Take 5 mg by mouth at bedtime as needed (for sleep).        Allergies  Allergen Reactions  . Codeine   . Erythromycin   . Sulfonamide Derivatives   . Tramadol     History  Substance Use Topics  . Smoking status: Former Smoker -- 20 years    Types: Cigarettes    Quit date: 12/12/1988  . Smokeless tobacco: Never Used     Comment: "random smoker"  . Alcohol Use: 5.0 oz/week    10 drink(s) per week     Comment: occasional 3 times per week wine     Family History  Problem Relation Age of Onset  . Heart disease Mother   . Hypertension Mother   . Multiple myeloma Father   . Breast cancer Maternal Aunt     Age 58's  . Heart disease Maternal Uncle      Review of Systems A comprehensive review of systems was negative.  Objective: Vital signs in last 24 hours: Temp:  [97 F (36.1 C)] 97 F (36.1 C) (09/19 1016) Pulse Rate:  [89] 89 (09/19 1016) Resp:  [18]  18 (09/19 1016) BP: (134)/(85) 134/85 mmHg (09/19 1016) SpO2:  [100 %] 100 % (09/19 1016)  EXAM:  Patient is a thin white female in discomfort, but no acute distress.   Lungs are clear to auscultation , the patient has symmetrical respiratory excursion. Heart has a regular rate and rhythm normal S1 and S2 no murmur.   Abdomen is soft nontender nondistended bowel sounds are present. Extremity examination shows no clubbing cyanosis or edema. Motor examination shows the right iliopsoas and quadriceps are 4+/5.  The left iliopsoas and quadriceps are 5/5.  The dorsiflexor, EHL, and plantar flexor are 5/5 bilaterally.  Sensation is intact to pinprick.reflexes show the left quadriceps is 2, and the right quadriceps is absent, gastrocnemius is trace to one bilaterally.  Toes are downgoing bilaterally.  Gait and stance both favor the right lower extremity.  Data Review:CBC    Component Value Date/Time   WBC 10.7* 08/28/2013 1001   RBC 4.96 08/28/2013 1001   HGB 15.3* 08/28/2013 1001   HCT 44.9 08/28/2013 1001   PLT 338 08/28/2013 1001   MCV 90.5 08/28/2013 1001   MCH 30.8 08/28/2013 1001   MCHC 34.1 08/28/2013 1001   RDW 12.8 08/28/2013 1001                          BMET    Component Value Date/Time   NA 132* 08/28/2013 1001   K 4.2 08/28/2013 1001   CL 94* 08/28/2013 1001   CO2 27 08/28/2013 1001   GLUCOSE 105* 08/28/2013 1001   BUN 24* 08/28/2013 1001   CREATININE 0.77 08/28/2013 1001   CALCIUM 9.4 08/28/2013 1001   GFRNONAA 86* 08/28/2013 1001   GFRAA >90 08/28/2013 1001     Assessment/Plan: Patient with worsening right lumbar radiculopathy, with persistent pain, but increasing weakness in the proximal right lower family, secondary to a right L4-L5 extraforaminal disc herniation.  Patient is admitted now for a right L4-L5 extraforaminal microdiscectomy.  I've discussed with the patient the nature of his condition, the nature the surgical procedure, the typical length of surgery, hospital stay, and  overall recuperation. We discussed limitations postoperatively. I discussed risks of surgery including risks of infection, bleeding, possibly need for transfusion, the risk of nerve root dysfunction with pain, weakness, numbness, or  paresthesias, or risk of dural tear and CSF leakage and possible need for further surgery, the risk of recurrent disc herniation and the possible need for further surgery, and the risk of anesthetic complications including myocardial infarction, stroke, pneumonia, and death. Understanding all this the patient does wish to proceed with surgery and is admitted for such.    Hewitt Shorts, MD 08/30/2013 11:10 AM

## 2013-08-30 NOTE — Op Note (Addendum)
08/30/2013  3:02 PM  PATIENT:  Madison Kramer  67 y.o. female  PRE-OPERATIVE DIAGNOSIS:  Spondylolisthesis, right L4-5 extraforaminal Lumbar herniated nucleus pulposus without myelopathy, Lumbar degenerative disc disease, Lumbar spondylosis  POST-OPERATIVE DIAGNOSIS:  Spondylolisthesis, right L4-5 extraforaminal Lumbar herniated nucleus pulposus without myelopathy, Lumbar degenerative disc disease, Lumbar spondylosis  PROCEDURE:  Procedure(s): Right Lumbar four-five Extraforaminal MicroDiskectomy, with microdissection, microsurgical technique, and the operating microscope  SURGEON:  Surgeon(s): Hewitt Shorts, MD Tia Alert, MD  ASSISTANTS: Marikay Alar, M.D.  ANESTHESIA:   general  EBL:   <25cc  COUNT: Correct per nursing staff  DICTATION: Patient was brought to the operating room and placed under general endotracheal anesthesia. Patient was turned to prone position the lumbar region was prepped with Betadine soap and solution and draped in a sterile fashion. The midline was infiltrated with local anesthetic with epinephrine. A localizing x-ray was taken and the L4-5 level was identified. Midline incision was made over the L4-5 level and was carried down through the subcutaneous tissue to the lumbar fascia. The lumbar fascia was incised on the right side and the paraspinal muscles were dissected from the spinous processes and lamina in a subperiosteal fashion. Another x-ray was taken and the L4-5 intertransverse space was identified. The operating microscope was draped and brought into the field provided additional magnification, illumination, and visualization. We began to divide the intertransverse fascia and muscle, explore the extraforaminal space. Lateral facetectomy was performed using the high-speed drill and Kerrison punches. The right L4 nerve root was identified. The disc herniation was identified ventral to the nerve root, and the nerve root gently retracted so as to expose  the disc herniation. We were able to remove the free fragment of disc herniation, within the extraforaminal space, and decompress the exiting right L4 nerve root. Several moderately large fragments were removed, and good decompression of the root was achieved. We carefully explored the extraforaminal space, and no further fragments were found. Once the discectomy was completed and good decompression of the nerve had been achieved, hemostasis was established with the use of bipolar cautery and Gelfoam with thrombin. The Gelfoam was removed and hemostasis confirmed. We then instilled 2 cc of fentanyl and 80 mg of Depo-Medrol into the extraforaminal space. Deep fascia was closed with interrupted undyed 1 Vicryl sutures. The subcutaneous and subcuticular layer were closed with interrupted inverted 2-0 and 3-0 undyed Vicryl sutures. The skin edges were approximated with Dermabond. Following surgery the patient was turned back to a supine position to be reversed from the anesthetic extubated and transferred to the recovery room for further care.   PLAN OF CARE: Admit for overnight observation  PATIENT DISPOSITION:  PACU - hemodynamically stable.   Delay start of Pharmacological VTE agent (>24hrs) due to surgical blood loss or risk of bleeding:  yes

## 2013-08-30 NOTE — Anesthesia Procedure Notes (Signed)
Procedure Name: Intubation Date/Time: 08/30/2013 1:40 PM Performed by: Jefm Miles E Pre-anesthesia Checklist: Patient identified, Emergency Drugs available, Patient being monitored, Suction available and Timeout performed Patient Re-evaluated:Patient Re-evaluated prior to inductionOxygen Delivery Method: Circle system utilized Preoxygenation: Pre-oxygenation with 100% oxygen Intubation Type: IV induction Ventilation: Mask ventilation without difficulty Laryngoscope Size: Mac and 3 Grade View: Grade I Tube type: Oral Tube size: 7.0 mm Number of attempts: 1 Airway Equipment and Method: Stylet Placement Confirmation: ETT inserted through vocal cords under direct vision,  positive ETCO2 and breath sounds checked- equal and bilateral Secured at: 21 cm Tube secured with: Tape Dental Injury: Teeth and Oropharynx as per pre-operative assessment

## 2013-08-30 NOTE — Progress Notes (Signed)
Filed Vitals:   08/30/13 1627 08/30/13 1630 08/30/13 1639 08/30/13 1724  BP: 156/69   164/90  Pulse: 72 78 71 86  Temp:  97.2 F (36.2 C)    TempSrc:      Resp: 13 23 12 16   SpO2: 100% 100% 100% 98%    CBC  Recent Labs  08/28/13 1001  WBC 10.7*  HGB 15.3*  HCT 44.9  PLT 338   BMET  Recent Labs  08/28/13 1001  NA 132*  K 4.2  CL 94*  CO2 27  GLUCOSE 105*  BUN 24*  CREATININE 0.77  CALCIUM 9.4    Up and ambulating in halls. Has not yet voided. Wound clean and dry. Minimal residual radicular symptoms.  Plan: Continued to progress thru postoperative recovery.  Hewitt Shorts, MD 08/30/2013, 6:45 PM

## 2013-08-30 NOTE — Transfer of Care (Signed)
Immediate Anesthesia Transfer of Care Note  Patient: Madison Kramer  Procedure(s) Performed: Procedure(s): Right Lumbar four-five Extraforaminal Diskectomy (Right)  Patient Location: PACU  Anesthesia Type:General  Level of Consciousness: awake, alert  and oriented  Airway & Oxygen Therapy: Patient Spontanous Breathing and Patient connected to nasal cannula oxygen  Post-op Assessment: Report given to PACU RN and Patient moving all extremities X 4  Post vital signs: Reviewed and stable  Complications: No apparent anesthesia complications

## 2013-08-31 MED ORDER — HYDROXYZINE HCL 50 MG PO TABS: 50.0000 mg | ORAL_TABLET | ORAL | Status: DC | PRN

## 2013-08-31 MED ORDER — HYDROCODONE-ACETAMINOPHEN 5-325 MG PO TABS: 1.0000 | ORAL_TABLET | ORAL | Status: DC | PRN

## 2013-08-31 NOTE — Discharge Summary (Signed)
Physician Discharge Summary  Patient ID: Madison Kramer MRN: [id number] DOB/AGE: [birth year] 67 y.o.  Admit date: 08/30/2013 Discharge date: 08/31/2013  Admission Diagnoses:  Spondylolisthesis, right L4-5 extraforaminal Lumbar herniated nucleus pulposus without myelopathy, Lumbar degenerative disc disease, Lumbar spondylosis  Discharge Diagnoses:  Spondylolisthesis, right L4-5 extraforaminal Lumbar herniated nucleus pulposus without myelopathy, Lumbar degenerative disc disease, Lumbar spondylosis  Discharged Condition: good  Hospital Course: Patient admitted, underwent a right L4-5 extremity microdiscectomy. Postoperatively she has significant improvement in her disabling right lumbar radiculopathy. She is up and living in the halls. Her wound is healing well. She is asking to be discharged home. She's been given instructions regarding wound care and activities. She is to return for followup with me in 3 weeks.  Discharge Exam: Blood pressure 98/57, pulse 78, temperature 97.8 F (36.6 C), temperature source Oral, resp. rate 16, SpO2 98.00%.  Disposition: Home     Medication List         aspirin EC 81 MG tablet  Take 81 mg by mouth daily.     atorvastatin 40 MG tablet  Commonly known as:  LIPITOR  Take 40 mg by mouth daily.     beclomethasone 80 MCG/ACT inhaler  Commonly known as:  QVAR  Inhale 1 puff into the lungs 2 (two) times daily.     busPIRone 5 MG tablet  Commonly known as:  BUSPAR  Take 5 mg by mouth daily as needed (for anxiety).     CALCIUM + D PO  Take 1 tablet by mouth daily.     eszopiclone 1 MG Tabs tablet  Commonly known as:  LUNESTA  Take 1 mg by mouth at bedtime as needed (for sleep). Take immediately before bedtime     fluticasone 50 MCG/ACT nasal spray  Commonly known as:  FLONASE  Place 2 sprays into the nose at bedtime.     HYDROcodone-acetaminophen 5-325 MG per tablet  Commonly known as:  NORCO/VICODIN  Take 1-2 tablets by mouth every 4 (four)  hours as needed for pain.     hydrOXYzine 50 MG tablet  Commonly known as:  ATARAX/VISTARIL  Take 1 tablet (50 mg total) by mouth every 4 (four) hours as needed (nausea, vomiting).     losartan 50 MG tablet  Commonly known as:  COZAAR  Take 50 mg by mouth daily.     Magnesium 250 MG Tabs  Take 250 mg by mouth daily.     multivitamin capsule  Take 1 capsule by mouth daily.     PROBIOTIC DAILY PO  Take 1 tablet by mouth daily.     TUMERSAID PO  Take 1 tablet by mouth daily.     zaleplon 5 MG capsule  Commonly known as:  SONATA  Take 5 mg by mouth at bedtime as needed (for sleep).         SignedHewitt Shorts, MD 08/31/2013, 9:02 AM

## 2013-08-31 NOTE — Progress Notes (Signed)
Pt and daughter given D/C instructions with Rx's, verbal understanding was given. Pt D/C'd home via wheelchair @ 1025 per MD order. Pt's 3-n-1 was delivered prior to D/C. Pt stable @ D/C. Rema Fendt, RN

## 2013-08-31 NOTE — Progress Notes (Signed)
   CARE MANAGEMENT NOTE 08/31/2013  Patient:  MELONY, TENPAS   Account Number:  [id number]  Date Initiated:  08/31/2013  Documentation initiated by:  [facility]  Subjective/Objective Assessment:   adm: Spondylolisthesis     Action/Plan:   discharge planning   Anticipated DC Date:  08/31/2013   Anticipated DC Plan:  HOME/SELF CARE      DC Planning Services  CM consult      Choice offered to / List presented to:     DME arranged  3-N-1      DME agency  [facility]        Status of service:  Completed, signed off Medicare Important Message given?   (If response is "NO", the following Medicare IM given date fields will be blank) Date Medicare IM given:   Date Additional Medicare IM given:    Discharge Disposition:  HOME/SELF CARE  Per UR Regulation:    If discussed at Long Length of Stay Meetings, dates discussed:    Comments:  08/31/13 O9:22 RN called to request 3n1.  3n1 will be delivered to room prior to discharge.  No other CM needs were communicated.  Freddy Jaksch, BSN,M [phone].

## 2013-09-02 ENCOUNTER — Encounter (HOSPITAL_COMMUNITY): Payer: Self-pay | Admitting: Neurosurgery

## 2013-09-02 NOTE — Anesthesia Postprocedure Evaluation (Signed)
  Anesthesia Post-op Note  Patient: Madison Kramer  Procedure(s) Performed: Procedure(s): Right Lumbar four-five Extraforaminal Diskectomy (Right)  Patient Location: PACU  Anesthesia Type:General  Level of Consciousness: awake and sedated  Airway and Oxygen Therapy: Patient Spontanous Breathing  Post-op Pain: mild  Post-op Assessment: Post-op Vital signs reviewed  Post-op Vital Signs: stable  Complications: No apparent anesthesia complications

## 2013-09-24 DIAGNOSIS — J309 Allergic rhinitis, unspecified: Secondary | ICD-10-CM | POA: Diagnosis not present

## 2013-09-27 DIAGNOSIS — J309 Allergic rhinitis, unspecified: Secondary | ICD-10-CM | POA: Diagnosis not present

## 2013-10-01 DIAGNOSIS — J309 Allergic rhinitis, unspecified: Secondary | ICD-10-CM | POA: Diagnosis not present

## 2013-10-03 DIAGNOSIS — J309 Allergic rhinitis, unspecified: Secondary | ICD-10-CM | POA: Diagnosis not present

## 2013-10-04 DIAGNOSIS — Z23 Encounter for immunization: Secondary | ICD-10-CM | POA: Diagnosis not present

## 2013-10-08 DIAGNOSIS — I10 Essential (primary) hypertension: Secondary | ICD-10-CM | POA: Diagnosis not present

## 2013-10-09 DIAGNOSIS — J309 Allergic rhinitis, unspecified: Secondary | ICD-10-CM | POA: Diagnosis not present

## 2013-10-11 DIAGNOSIS — J309 Allergic rhinitis, unspecified: Secondary | ICD-10-CM | POA: Diagnosis not present

## 2013-10-16 DIAGNOSIS — J309 Allergic rhinitis, unspecified: Secondary | ICD-10-CM | POA: Diagnosis not present

## 2013-10-17 ENCOUNTER — Other Ambulatory Visit: Payer: Self-pay

## 2013-10-21 DIAGNOSIS — J309 Allergic rhinitis, unspecified: Secondary | ICD-10-CM | POA: Diagnosis not present

## 2013-10-28 DIAGNOSIS — J309 Allergic rhinitis, unspecified: Secondary | ICD-10-CM | POA: Diagnosis not present

## 2013-11-13 DIAGNOSIS — J309 Allergic rhinitis, unspecified: Secondary | ICD-10-CM | POA: Diagnosis not present

## 2013-11-19 DIAGNOSIS — J309 Allergic rhinitis, unspecified: Secondary | ICD-10-CM | POA: Diagnosis not present

## 2013-11-21 DIAGNOSIS — H2510 Age-related nuclear cataract, unspecified eye: Secondary | ICD-10-CM | POA: Diagnosis not present

## 2013-11-21 DIAGNOSIS — H26019 Infantile and juvenile cortical, lamellar, or zonular cataract, unspecified eye: Secondary | ICD-10-CM | POA: Diagnosis not present

## 2013-11-22 DIAGNOSIS — K219 Gastro-esophageal reflux disease without esophagitis: Secondary | ICD-10-CM | POA: Diagnosis not present

## 2013-11-22 DIAGNOSIS — H1045 Other chronic allergic conjunctivitis: Secondary | ICD-10-CM | POA: Diagnosis not present

## 2013-11-22 DIAGNOSIS — J3089 Other allergic rhinitis: Secondary | ICD-10-CM | POA: Diagnosis not present

## 2013-11-22 DIAGNOSIS — J45909 Unspecified asthma, uncomplicated: Secondary | ICD-10-CM | POA: Diagnosis not present

## 2013-11-26 DIAGNOSIS — J309 Allergic rhinitis, unspecified: Secondary | ICD-10-CM | POA: Diagnosis not present

## 2013-12-09 DIAGNOSIS — J309 Allergic rhinitis, unspecified: Secondary | ICD-10-CM | POA: Diagnosis not present

## 2013-12-17 DIAGNOSIS — J309 Allergic rhinitis, unspecified: Secondary | ICD-10-CM | POA: Diagnosis not present

## 2013-12-18 ENCOUNTER — Encounter: Payer: Self-pay | Admitting: Gynecology

## 2013-12-18 ENCOUNTER — Ambulatory Visit (INDEPENDENT_AMBULATORY_CARE_PROVIDER_SITE_OTHER): Payer: Medicare Other | Admitting: Gynecology

## 2013-12-18 VITALS — BP 116/70 | Ht 64.0 in | Wt 117.0 lb

## 2013-12-18 DIAGNOSIS — N952 Postmenopausal atrophic vaginitis: Secondary | ICD-10-CM

## 2013-12-18 DIAGNOSIS — N9489 Other specified conditions associated with female genital organs and menstrual cycle: Secondary | ICD-10-CM | POA: Diagnosis not present

## 2013-12-18 DIAGNOSIS — L940 Localized scleroderma [morphea]: Secondary | ICD-10-CM | POA: Diagnosis not present

## 2013-12-18 DIAGNOSIS — M899 Disorder of bone, unspecified: Secondary | ICD-10-CM | POA: Diagnosis not present

## 2013-12-18 DIAGNOSIS — N898 Other specified noninflammatory disorders of vagina: Secondary | ICD-10-CM

## 2013-12-18 DIAGNOSIS — L900 Lichen sclerosus et atrophicus: Secondary | ICD-10-CM

## 2013-12-18 DIAGNOSIS — M8580 Other specified disorders of bone density and structure, unspecified site: Secondary | ICD-10-CM

## 2013-12-18 DIAGNOSIS — M949 Disorder of cartilage, unspecified: Secondary | ICD-10-CM

## 2013-12-18 NOTE — Progress Notes (Signed)
Madison Kramer May 06, 1946 [id number]        68 y.o.  G2P2002 for followup exam.  Former patient of Dr. Cherylann Banas. Several issues noted below.  Past medical history,surgical history, problem list, medications, allergies, family history and social history were all reviewed and documented in the EPIC chart.  ROS:  Performed and pertinent positives and negatives are included in the history, assessment and plan .  Exam: [name] Vitals:   12/18/13 1356  BP: 116/70  Height: 5\' 4"  (1.626 m)  Weight: 117 lb (53.071 kg)   General appearance  Normal Skin grossly normal Head/Neck normal with no cervical or supraclavicular adenopathy thyroid normal Lungs  clear Cardiac RR, without RMG Abdominal  soft, nontender, without masses, organomegaly or hernia Breasts  examined lying and sitting without masses, retractions, discharge or axillary adenopathy. Bilateral reduction scars noted. Pelvic  Ext/BUS/vagina  generalized atrophic changes  Cervix  Normal with atrophic changes  Uterus  anteverted, normal size, shape and contour, midline and mobile nontender   Adnexa  Without masses or tenderness    Anus and perineum  Normal   Rectovaginal  Normal sphincter tone without palpated masses or tenderness.    Assessment/Plan:  68 y.o. [id number] female for followup exam.   1. Postmenopausal/atrophic genital changes. Patient is having some issues with vaginal dryness. No significant hot flushes, night sweats. Is not sexually active. OTC products were reviewed. Patient is not interested in HRT or estrogen vaginal products. She will followup if OTC products unsuccessful and she wants to discuss other options. 2. Lichen sclerosis. Biopsy-proven lichen sclerosus 0962. Patient without significant itching or irritated symptoms. Exam without overt abnormalities. Will continue to monitor. 3. Osteopenia. DEXA 11/2011 at [facility] with T score -2.3 FRAX 10.7%/2.2% history of bisphosphate use for over 5 years per  Dr. Valeta Harms note on drug-free holiday. Study appeared to stable from prior study. Recommend repeat DEXA now a 2 year interval if she is going to arrange through [facility]. Increase calcium vitamin D reviewed. 4. Pap smear 09/2011. No Pap smear done today. No history of abnormal Pap smears previously. Review current screening guidelines. Options to stop altogether as she is over the age of 84 versus less frequent screening intervals discussed. Will readdress on an annual basis. 5. Mammography 01/2013. Does have maternal aunt history of early-onset breast cancer with Jewish heritage. Dr. Cherylann Banas had discussed and offered genetic testing which was declined. Continue with annual mammography. SBE monthly reviewed. 6. Colonoscopy 2008. Repeat at their recommended interval. 7. Occasional insomnia. Patient uses Sonata 5 mg occasionally per Dr. Cherylann Banas. Has supply at home but will call if she needs refill. 8. Health maintenance. No blood work done as this is all done through her primary physician's office. Followup in one year, sooner as needed.  Note: This document was prepared with digital dictation and possible smart phrase technology. Any transcriptional errors that result from this process are unintentional.   [name] MD, 2:36 PM 12/18/2013

## 2013-12-18 NOTE — Patient Instructions (Signed)
Followup for bone density otherwise followup in one year for annual exam, sooner if any issues.

## 2013-12-19 LAB — URINALYSIS W MICROSCOPIC + REFLEX CULTURE
BACTERIA UA: NONE SEEN
Bilirubin Urine: NEGATIVE
CRYSTALS: NONE SEEN
Casts: NONE SEEN
Glucose, UA: NEGATIVE mg/dL
Hgb urine dipstick: NEGATIVE
KETONES UR: NEGATIVE mg/dL
LEUKOCYTES UA: NEGATIVE
NITRITE: NEGATIVE
Protein, ur: NEGATIVE mg/dL
SPECIFIC GRAVITY, URINE: 1.026 (ref 1.005–1.030)
SQUAMOUS EPITHELIAL / LPF: NONE SEEN
Urobilinogen, UA: 0.2 mg/dL (ref 0.0–1.0)
pH: 5.5 (ref 5.0–8.0)

## 2013-12-23 ENCOUNTER — Encounter: Payer: Self-pay | Admitting: Obstetrics and Gynecology

## 2013-12-24 DIAGNOSIS — J309 Allergic rhinitis, unspecified: Secondary | ICD-10-CM | POA: Diagnosis not present

## 2014-01-02 DIAGNOSIS — J309 Allergic rhinitis, unspecified: Secondary | ICD-10-CM | POA: Diagnosis not present

## 2014-01-10 DIAGNOSIS — M47817 Spondylosis without myelopathy or radiculopathy, lumbosacral region: Secondary | ICD-10-CM | POA: Diagnosis not present

## 2014-01-10 DIAGNOSIS — M4310 Spondylolisthesis, site unspecified: Secondary | ICD-10-CM | POA: Diagnosis not present

## 2014-01-10 DIAGNOSIS — J309 Allergic rhinitis, unspecified: Secondary | ICD-10-CM | POA: Diagnosis not present

## 2014-01-10 DIAGNOSIS — M5137 Other intervertebral disc degeneration, lumbosacral region: Secondary | ICD-10-CM | POA: Diagnosis not present

## 2014-01-10 DIAGNOSIS — M5126 Other intervertebral disc displacement, lumbar region: Secondary | ICD-10-CM | POA: Diagnosis not present

## 2014-01-14 DIAGNOSIS — J309 Allergic rhinitis, unspecified: Secondary | ICD-10-CM | POA: Diagnosis not present

## 2014-01-21 DIAGNOSIS — J309 Allergic rhinitis, unspecified: Secondary | ICD-10-CM | POA: Diagnosis not present

## 2014-01-30 DIAGNOSIS — J309 Allergic rhinitis, unspecified: Secondary | ICD-10-CM | POA: Diagnosis not present

## 2014-02-11 DIAGNOSIS — J309 Allergic rhinitis, unspecified: Secondary | ICD-10-CM | POA: Diagnosis not present

## 2014-02-14 DIAGNOSIS — Z1231 Encounter for screening mammogram for malignant neoplasm of breast: Secondary | ICD-10-CM | POA: Diagnosis not present

## 2014-02-17 ENCOUNTER — Encounter: Payer: Self-pay | Admitting: Gynecology

## 2014-02-20 DIAGNOSIS — J309 Allergic rhinitis, unspecified: Secondary | ICD-10-CM | POA: Diagnosis not present

## 2014-02-25 DIAGNOSIS — J309 Allergic rhinitis, unspecified: Secondary | ICD-10-CM | POA: Diagnosis not present

## 2014-03-03 DIAGNOSIS — J309 Allergic rhinitis, unspecified: Secondary | ICD-10-CM | POA: Diagnosis not present

## 2014-03-05 DIAGNOSIS — J309 Allergic rhinitis, unspecified: Secondary | ICD-10-CM | POA: Diagnosis not present

## 2014-03-10 DIAGNOSIS — J309 Allergic rhinitis, unspecified: Secondary | ICD-10-CM | POA: Diagnosis not present

## 2014-03-12 DIAGNOSIS — J309 Allergic rhinitis, unspecified: Secondary | ICD-10-CM | POA: Diagnosis not present

## 2014-03-13 ENCOUNTER — Other Ambulatory Visit: Payer: Self-pay | Admitting: Cardiology

## 2014-03-17 DIAGNOSIS — J019 Acute sinusitis, unspecified: Secondary | ICD-10-CM | POA: Diagnosis not present

## 2014-03-17 DIAGNOSIS — H1045 Other chronic allergic conjunctivitis: Secondary | ICD-10-CM | POA: Diagnosis not present

## 2014-03-17 DIAGNOSIS — J45909 Unspecified asthma, uncomplicated: Secondary | ICD-10-CM | POA: Diagnosis not present

## 2014-03-17 DIAGNOSIS — J3089 Other allergic rhinitis: Secondary | ICD-10-CM | POA: Diagnosis not present

## 2014-03-24 DIAGNOSIS — J309 Allergic rhinitis, unspecified: Secondary | ICD-10-CM | POA: Diagnosis not present

## 2014-03-27 DIAGNOSIS — J309 Allergic rhinitis, unspecified: Secondary | ICD-10-CM | POA: Diagnosis not present

## 2014-03-28 ENCOUNTER — Encounter: Payer: Self-pay | Admitting: Cardiology

## 2014-03-28 ENCOUNTER — Ambulatory Visit (INDEPENDENT_AMBULATORY_CARE_PROVIDER_SITE_OTHER): Payer: Medicare Other | Admitting: Cardiology

## 2014-03-28 VITALS — BP 123/78 | HR 64 | Ht 64.0 in | Wt 112.1 lb

## 2014-03-28 DIAGNOSIS — I10 Essential (primary) hypertension: Secondary | ICD-10-CM

## 2014-03-28 DIAGNOSIS — E785 Hyperlipidemia, unspecified: Secondary | ICD-10-CM

## 2014-03-28 DIAGNOSIS — R079 Chest pain, unspecified: Secondary | ICD-10-CM | POA: Diagnosis not present

## 2014-03-28 NOTE — Patient Instructions (Signed)
Your physician recommends that you have  lab work today--lipid profile.  Your physician wants you to follow-up in: 1 year with Dr Aundra Dubin. (April 2016). You will receive a reminder letter in the mail two months in advance. If you don't receive a letter, please call our office to schedule the follow-up appointment.

## 2014-03-29 LAB — LIPID PANEL
Cholesterol: 186 mg/dL (ref 0–200)
HDL: 82 mg/dL (ref >39)
LDL Cholesterol: 88 mg/dL (ref 0–99)
Total CHOL/HDL Ratio: 2.3 Ratio
Triglycerides: 78 mg/dL (ref <150)
VLDL: 16 mg/dL (ref 0–40)

## 2014-03-30 NOTE — Progress Notes (Signed)
Patient ID: Madison Kramer, female   DOB: [birth year], 68 y.o.   MRN: [id number] PCP: Dr. Laurann [place]  68 yo with history of atypical chest pain and palpitations presents for cardiology followup.  She saw Dr. Rollene Fare in the past and sees me for the first time today.  She had a Cardiolite in 3/11 that was probably normal.  She had an echo in 4/14 with no significant abnormalities and normal EF.  She has had minimal palpitations recently.  She works out daily (swimming, [employer], yoga).  No exertional dyspnea or chest pain.  She still works full time as a Forensic psychologist.  BP is under control on losartan.  Lipids in 8/14 were acceptable.   ECG: NSR, Qs in V1 and V2  Labs (8/14): LDL 95, HDL 71 Labs (10/14): K 4.3, creatinine 0.61  PMH: 1. GERD 2. HTN 3. Raynaud's phenomenon 4. Allergic rhinitis 5. H/o atypical chest pain: ETT Cardiolite 3/11 with 10 METS exercise, likely normal with breast attenuation.  Echo (4/14) with EF 55-60%, no significant abnormalities.   6. DDD/Low back pain. 7. Diverticulosis 8. Asthma: Followed by Dr. Melvyn Novas.  9. Palpitations  SH: Quit smoking in 1985.  Real estate agent.  Divorced, lives in [place].    FH: Mother with ?SCD.   ROS: All systems reviewed and negative except as per HPI  Current Outpatient Prescriptions  Medication Sig Dispense Refill  . aspirin EC 81 MG tablet Take 81 mg by mouth daily. Pt states she only takes this sometimes (67/17/15)      . atorvastatin (LIPITOR) 40 MG tablet TAKE (1) TABLET DAILY AT BEDTIME.  30 tablet  3  . beclomethasone (QVAR) 80 MCG/ACT inhaler Inhale 1 puff into the lungs daily.       . busPIRone (BUSPAR) 5 MG tablet Take 5 mg by mouth daily as needed (for anxiety).      . Calcium Carbonate-Vitamin D (CALCIUM + D PO) Take 1 tablet by mouth daily.       . eszopiclone (LUNESTA) 1 MG TABS tablet Take 1 mg by mouth at bedtime as needed (for sleep). Take immediately before bedtime      . losartan (COZAAR) 50 MG tablet Take 50  mg by mouth daily.      . Magnesium 250 MG TABS Take 250 mg by mouth daily.      . Multiple Vitamin (MULTIVITAMIN) capsule Take 1 capsule by mouth daily.        . Probiotic Product (PROBIOTIC DAILY PO) Take 1 tablet by mouth daily.      . zaleplon (SONATA) 5 MG capsule Take 5 mg by mouth at bedtime as needed (for sleep).        No current facility-administered medications for this visit.   BP 123/78  Pulse 64  Ht 5\' 4"  (1.626 m)  Wt 50.857 kg (112 lb 1.9 oz)  BMI 19.24 kg/m2 General: NAD Neck: No JVD, no thyromegaly or thyroid nodule.  Lungs: Clear to auscultation bilaterally with normal respiratory effort. CV: Nondisplaced PMI.  Heart regular S1/S2, no S3/S4, no murmur.  No peripheral edema.  No carotid bruit.  Normal pedal pulses.  Abdomen: Soft, nontender, no hepatosplenomegaly, no distention.  Skin: Intact without lesions or rashes.  Neurologic: Alert and oriented x 3.  Psych: Normal affect. Extremities: No clubbing or cyanosis.  HEENT: Normal.   Assessment/Plan: 1. Chest pain: History of atypical chest pain.  No recent symptoms.  Negative Cardiolite in 3/11.   2. Hyperlipidemia: Good lipids  in 8/14.  I will arrange for repeat lipid profile.  3. HTN: BP is controlled on current regimen.  4. Palpitations: Minimal.   Followup in 1 year.   Larey Dresser 03/30/2014

## 2014-03-31 DIAGNOSIS — J309 Allergic rhinitis, unspecified: Secondary | ICD-10-CM | POA: Diagnosis not present

## 2014-04-02 ENCOUNTER — Telehealth: Payer: Self-pay | Admitting: Cardiology

## 2014-04-02 DIAGNOSIS — J309 Allergic rhinitis, unspecified: Secondary | ICD-10-CM | POA: Diagnosis not present

## 2014-04-02 DIAGNOSIS — E785 Hyperlipidemia, unspecified: Secondary | ICD-10-CM

## 2014-04-02 DIAGNOSIS — I10 Essential (primary) hypertension: Secondary | ICD-10-CM

## 2014-04-02 NOTE — Telephone Encounter (Signed)
Patient called requesting test results from Lipid Profile. Patient notified of results being WNL. Patient requested that Dr. Aundra Dubin or Desiree Lucy, RN, call her back to discuss the possibility of her decreasing her Lipitor medication dosage, given that her lipids were WNL.  Routed to Dr. Karlyne Greenspan, RN.

## 2014-04-02 NOTE — Telephone Encounter (Signed)
She can cut atorvastatin to 20 mg daily but would repeat lipids in 2 months and watch diet closely.

## 2014-04-02 NOTE — Telephone Encounter (Signed)
New message    Patient calling for test results .    Can leave a message on voice mail.

## 2014-04-07 MED ORDER — ATORVASTATIN CALCIUM 20 MG PO TABS: 20.0000 mg | ORAL_TABLET | Freq: Every day | ORAL | Status: DC

## 2014-04-07 NOTE — Telephone Encounter (Signed)
Lab scheduled for 06/18/14.

## 2014-04-07 NOTE — Telephone Encounter (Signed)
Pt advised.

## 2014-04-08 DIAGNOSIS — J309 Allergic rhinitis, unspecified: Secondary | ICD-10-CM | POA: Diagnosis not present

## 2014-04-11 DIAGNOSIS — M47817 Spondylosis without myelopathy or radiculopathy, lumbosacral region: Secondary | ICD-10-CM | POA: Diagnosis not present

## 2014-04-11 DIAGNOSIS — M5137 Other intervertebral disc degeneration, lumbosacral region: Secondary | ICD-10-CM | POA: Diagnosis not present

## 2014-04-17 DIAGNOSIS — J309 Allergic rhinitis, unspecified: Secondary | ICD-10-CM | POA: Diagnosis not present

## 2014-04-23 DIAGNOSIS — J309 Allergic rhinitis, unspecified: Secondary | ICD-10-CM | POA: Diagnosis not present

## 2014-04-30 DIAGNOSIS — J309 Allergic rhinitis, unspecified: Secondary | ICD-10-CM | POA: Diagnosis not present

## 2014-05-08 DIAGNOSIS — J309 Allergic rhinitis, unspecified: Secondary | ICD-10-CM | POA: Diagnosis not present

## 2014-05-14 DIAGNOSIS — J309 Allergic rhinitis, unspecified: Secondary | ICD-10-CM | POA: Diagnosis not present

## 2014-05-20 DIAGNOSIS — J309 Allergic rhinitis, unspecified: Secondary | ICD-10-CM | POA: Diagnosis not present

## 2014-06-06 DIAGNOSIS — J45909 Unspecified asthma, uncomplicated: Secondary | ICD-10-CM | POA: Diagnosis not present

## 2014-06-06 DIAGNOSIS — H1045 Other chronic allergic conjunctivitis: Secondary | ICD-10-CM | POA: Diagnosis not present

## 2014-06-06 DIAGNOSIS — J3089 Other allergic rhinitis: Secondary | ICD-10-CM | POA: Diagnosis not present

## 2014-06-06 DIAGNOSIS — K219 Gastro-esophageal reflux disease without esophagitis: Secondary | ICD-10-CM | POA: Diagnosis not present

## 2014-06-16 DIAGNOSIS — J309 Allergic rhinitis, unspecified: Secondary | ICD-10-CM | POA: Diagnosis not present

## 2014-06-18 ENCOUNTER — Other Ambulatory Visit (INDEPENDENT_AMBULATORY_CARE_PROVIDER_SITE_OTHER): Payer: Medicare Other

## 2014-06-18 DIAGNOSIS — E785 Hyperlipidemia, unspecified: Secondary | ICD-10-CM

## 2014-06-18 DIAGNOSIS — I10 Essential (primary) hypertension: Secondary | ICD-10-CM

## 2014-06-18 LAB — LIPID PANEL
CHOLESTEROL: 195 mg/dL (ref 0–200)
HDL: 75.6 mg/dL (ref >39.00)
LDL CALC: 99 mg/dL (ref 0–99)
NonHDL: 119.4
Total CHOL/HDL Ratio: 3
Triglycerides: 102 mg/dL (ref 0.0–149.0)
VLDL: 20.4 mg/dL (ref 0.0–40.0)

## 2014-06-19 DIAGNOSIS — L570 Actinic keratosis: Secondary | ICD-10-CM | POA: Diagnosis not present

## 2014-07-07 DIAGNOSIS — J309 Allergic rhinitis, unspecified: Secondary | ICD-10-CM | POA: Diagnosis not present

## 2014-07-22 DIAGNOSIS — J309 Allergic rhinitis, unspecified: Secondary | ICD-10-CM | POA: Diagnosis not present

## 2014-08-04 ENCOUNTER — Telehealth: Payer: Self-pay | Admitting: Internal Medicine

## 2014-08-04 NOTE — Telephone Encounter (Signed)
Pt states she is having problems with abdominal pain and lots of bloating. Pt scheduled to see Alonza Bogus PA 08/12/14@1 :30pm. Pt aware of appt.

## 2014-08-05 ENCOUNTER — Encounter: Payer: Self-pay | Admitting: *Deleted

## 2014-08-06 DIAGNOSIS — J309 Allergic rhinitis, unspecified: Secondary | ICD-10-CM | POA: Diagnosis not present

## 2014-08-11 DIAGNOSIS — D239 Other benign neoplasm of skin, unspecified: Secondary | ICD-10-CM | POA: Diagnosis not present

## 2014-08-11 DIAGNOSIS — L819 Disorder of pigmentation, unspecified: Secondary | ICD-10-CM | POA: Diagnosis not present

## 2014-08-11 DIAGNOSIS — L821 Other seborrheic keratosis: Secondary | ICD-10-CM | POA: Diagnosis not present

## 2014-08-11 DIAGNOSIS — D2370 Other benign neoplasm of skin of unspecified lower limb, including hip: Secondary | ICD-10-CM | POA: Diagnosis not present

## 2014-08-12 ENCOUNTER — Encounter: Payer: Self-pay | Admitting: Gastroenterology

## 2014-08-12 ENCOUNTER — Ambulatory Visit (INDEPENDENT_AMBULATORY_CARE_PROVIDER_SITE_OTHER): Payer: Medicare Other | Admitting: Gastroenterology

## 2014-08-12 VITALS — BP 112/76 | HR 80 | Ht 64.0 in

## 2014-08-12 DIAGNOSIS — K580 Irritable bowel syndrome with diarrhea: Secondary | ICD-10-CM

## 2014-08-12 DIAGNOSIS — R141 Gas pain: Secondary | ICD-10-CM

## 2014-08-12 DIAGNOSIS — K589 Irritable bowel syndrome without diarrhea: Secondary | ICD-10-CM | POA: Diagnosis not present

## 2014-08-12 DIAGNOSIS — R142 Eructation: Secondary | ICD-10-CM

## 2014-08-12 DIAGNOSIS — R140 Abdominal distension (gaseous): Secondary | ICD-10-CM

## 2014-08-12 DIAGNOSIS — R143 Flatulence: Secondary | ICD-10-CM | POA: Diagnosis not present

## 2014-08-12 MED ORDER — RIFAXIMIN 550 MG PO TABS: 550.0000 mg | ORAL_TABLET | Freq: Three times a day (TID) | ORAL | Status: DC

## 2014-08-12 NOTE — Progress Notes (Signed)
     08/12/2014 Madison Kramer [id number] 1946-07-25   History of Present Illness:  This is a pleasant 68 year old female who is previously known to Dr. Henrene Pastor. Her last colonoscopy was in October 2008 by Dr. Sammuel Cooper, which showed only left-sided diverticulosis at that time.  She is usually seen for and followed by Dr. Henrene Pastor for issues of acid reflux. Most recent EGD was in December 2013 at which time the study was normal. She presents to our office today with complaints of abdominal bloating and intermittent diarrhea. She says that this issue has been going on intermittently for several years. The symptoms seem to come and go and to her understanding she most likely has irritable bowel syndrome. She says that the symptoms do seem to be worse with stress. Symptoms have been present again for the past several weeks. She complains of intermittent diarrhea, some days with loose stools and some days not. She also complains about 3 or 4 days out of the week of being very bloated and her abdomen feeling distended and uncomfortable. She denies any particular abdominal pain per se. She reports that she was tested for celiac disease in the past and it was negative, however, I do not see those results. She believes that she probably does have some lactose intolerance as well but does not tend to eat many dairy products.  She would just like to know if there is something that she can take that will help with her symptoms, particularly the bloating.   Current Medications, Allergies, Past Medical History, Past Surgical History, Family History and Social History were reviewed in [employer] record.   Physical Exam: BP 112/76  Pulse 80  Ht 5\' 4"  (1.626 m) General: Well developed white female in no acute distress Head: Normocephalic and atraumatic Eyes:  Sclerae anicteric, conjunctiva pink  Ears: Normal auditory acuity Lungs: Clear throughout to auscultation Heart: Regular rate and  rhythm Abdomen: Soft, non-distended.  Normal bowel sounds.  Non-tender. Musculoskeletal: Symmetrical with no gross deformities  Extremities: No edema  Neurological: Alert oriented x 4, grossly non-focal Psychological:  Alert and cooperative. Normal mood and affect  Assessment and Recommendations: -Abdominal bloating with intermittent diarrhea:  Present for several years.  Likely has IBS-D vs SIBO.  Will undergo a trial of Xifaxan 550 mg TID for 14 days.  Then will begin a probiotic, preferably in the form of Florastor twice daily.  She already has follow-up with Dr. Henrene Pastor next month and will keep that appt.

## 2014-08-12 NOTE — Patient Instructions (Signed)
We have sent the following medications to your pharmacy for you to pick up at your convenience: Xifaxan 550 mg, please take one tablet by mouth three times daily for two weeks   You can purchase a probiotic over the counter and take once daily. Examples of probiotic are: Environmental education officer or [employer]

## 2014-08-12 NOTE — Progress Notes (Signed)
Agree 

## 2014-08-13 ENCOUNTER — Telehealth: Payer: Self-pay | Admitting: *Deleted

## 2014-08-13 ENCOUNTER — Telehealth: Payer: Self-pay | Admitting: Internal Medicine

## 2014-08-13 ENCOUNTER — Encounter: Payer: Self-pay | Admitting: *Deleted

## 2014-08-13 NOTE — Telephone Encounter (Signed)
Received via fax letter from [facility], prior [place] needed for [employer]. Called [phone]. I spoke with Rosaria Ferries and per Rosaria Ferries the prior Josem Kaufmann has to go through their [place]. We will not get a response for 24-48 hours.  Patients ID number is: [id number] DX: IBS-Diarrhea

## 2014-08-14 ENCOUNTER — Telehealth: Payer: Self-pay | Admitting: *Deleted

## 2014-08-14 ENCOUNTER — Telehealth: Payer: Self-pay | Admitting: Gastroenterology

## 2014-08-14 NOTE — Telephone Encounter (Signed)
Being handled by Hope Pigeon

## 2014-08-14 NOTE — Telephone Encounter (Signed)
Patient said Madison Kramer will be $1000.  Patient stated that she cannot afford it. Patient stated that she will start the probiotic and see if that helps and then talk to Dr. Henrene Pastor at next office visit to see if he has any suggestions.

## 2014-08-14 NOTE — Telephone Encounter (Signed)
She can start taking the probiotic (preferably the Florastor), but otherwise the xifaxan is the medication that I really want her to take so hopefully we can get it approved.

## 2014-08-14 NOTE — Telephone Encounter (Signed)
Patient called back. I advised patient prior Josem Kaufmann was done but it has to go through the clinical pharmacy to be approved, that can take 24-48 hrs. Patient said that she has to go out of town and she needs something now. Is there something else we can send in. Please advise?

## 2014-08-14 NOTE — Telephone Encounter (Signed)
Received fax from [facility], prescription for Xifaxan was approved Pharmacy and patient notified

## 2014-08-14 NOTE — Telephone Encounter (Signed)
Called patient back, gave Jessica's recommendations Patient verbalized understanding

## 2014-08-14 NOTE — Telephone Encounter (Signed)
I called patient back and advised Alonza Bogus, PA-C suggested patient try Flagyl 500 mg, BID for seven days. I advised patient with Flagyl she cannot have any alcohol. Patient stated that she is not interested in Flagyl, she will try probiotic. I advised patient to call back if probiotic does not help and we can try Flagyl.

## 2014-08-19 DIAGNOSIS — M5137 Other intervertebral disc degeneration, lumbosacral region: Secondary | ICD-10-CM | POA: Diagnosis not present

## 2014-08-19 DIAGNOSIS — M546 Pain in thoracic spine: Secondary | ICD-10-CM | POA: Diagnosis not present

## 2014-08-19 DIAGNOSIS — IMO0002 Reserved for concepts with insufficient information to code with codable children: Secondary | ICD-10-CM | POA: Diagnosis not present

## 2014-08-19 DIAGNOSIS — M545 Low back pain, unspecified: Secondary | ICD-10-CM | POA: Diagnosis not present

## 2014-08-19 DIAGNOSIS — M4310 Spondylolisthesis, site unspecified: Secondary | ICD-10-CM | POA: Diagnosis not present

## 2014-08-19 DIAGNOSIS — M47817 Spondylosis without myelopathy or radiculopathy, lumbosacral region: Secondary | ICD-10-CM | POA: Diagnosis not present

## 2014-08-27 DIAGNOSIS — M47817 Spondylosis without myelopathy or radiculopathy, lumbosacral region: Secondary | ICD-10-CM | POA: Diagnosis not present

## 2014-08-27 DIAGNOSIS — I10 Essential (primary) hypertension: Secondary | ICD-10-CM | POA: Diagnosis not present

## 2014-08-27 DIAGNOSIS — IMO0002 Reserved for concepts with insufficient information to code with codable children: Secondary | ICD-10-CM | POA: Diagnosis not present

## 2014-08-27 DIAGNOSIS — M5137 Other intervertebral disc degeneration, lumbosacral region: Secondary | ICD-10-CM | POA: Diagnosis not present

## 2014-08-28 ENCOUNTER — Other Ambulatory Visit: Payer: Self-pay | Admitting: Cardiology

## 2014-08-28 DIAGNOSIS — J309 Allergic rhinitis, unspecified: Secondary | ICD-10-CM | POA: Diagnosis not present

## 2014-08-29 ENCOUNTER — Other Ambulatory Visit: Payer: Self-pay | Admitting: Cardiology

## 2014-09-08 DIAGNOSIS — H1045 Other chronic allergic conjunctivitis: Secondary | ICD-10-CM | POA: Diagnosis not present

## 2014-09-08 DIAGNOSIS — J019 Acute sinusitis, unspecified: Secondary | ICD-10-CM | POA: Diagnosis not present

## 2014-09-08 DIAGNOSIS — J45909 Unspecified asthma, uncomplicated: Secondary | ICD-10-CM | POA: Diagnosis not present

## 2014-09-08 DIAGNOSIS — J3089 Other allergic rhinitis: Secondary | ICD-10-CM | POA: Diagnosis not present

## 2014-09-23 DIAGNOSIS — J3089 Other allergic rhinitis: Secondary | ICD-10-CM | POA: Diagnosis not present

## 2014-09-24 ENCOUNTER — Ambulatory Visit: Payer: Medicare Other | Admitting: Internal Medicine

## 2014-09-30 DIAGNOSIS — M4726 Other spondylosis with radiculopathy, lumbar region: Secondary | ICD-10-CM | POA: Diagnosis not present

## 2014-09-30 DIAGNOSIS — M5416 Radiculopathy, lumbar region: Secondary | ICD-10-CM | POA: Diagnosis not present

## 2014-09-30 DIAGNOSIS — M5440 Lumbago with sciatica, unspecified side: Secondary | ICD-10-CM | POA: Diagnosis not present

## 2014-09-30 DIAGNOSIS — M5136 Other intervertebral disc degeneration, lumbar region: Secondary | ICD-10-CM | POA: Diagnosis not present

## 2014-10-09 DIAGNOSIS — I10 Essential (primary) hypertension: Secondary | ICD-10-CM | POA: Diagnosis not present

## 2014-10-09 DIAGNOSIS — E780 Pure hypercholesterolemia: Secondary | ICD-10-CM | POA: Diagnosis not present

## 2014-10-09 DIAGNOSIS — J3089 Other allergic rhinitis: Secondary | ICD-10-CM | POA: Diagnosis not present

## 2014-10-09 DIAGNOSIS — Z23 Encounter for immunization: Secondary | ICD-10-CM | POA: Diagnosis not present

## 2014-10-09 DIAGNOSIS — Z1389 Encounter for screening for other disorder: Secondary | ICD-10-CM | POA: Diagnosis not present

## 2014-10-09 DIAGNOSIS — Z0000 Encounter for general adult medical examination without abnormal findings: Secondary | ICD-10-CM | POA: Diagnosis not present

## 2014-10-13 ENCOUNTER — Encounter: Payer: Self-pay | Admitting: *Deleted

## 2014-10-14 DIAGNOSIS — Z23 Encounter for immunization: Secondary | ICD-10-CM | POA: Diagnosis not present

## 2014-10-27 DIAGNOSIS — J3089 Other allergic rhinitis: Secondary | ICD-10-CM | POA: Diagnosis not present

## 2014-11-18 DIAGNOSIS — J3089 Other allergic rhinitis: Secondary | ICD-10-CM | POA: Diagnosis not present

## 2014-11-18 DIAGNOSIS — J4530 Mild persistent asthma, uncomplicated: Secondary | ICD-10-CM | POA: Diagnosis not present

## 2014-11-18 DIAGNOSIS — K219 Gastro-esophageal reflux disease without esophagitis: Secondary | ICD-10-CM | POA: Diagnosis not present

## 2014-11-18 DIAGNOSIS — H1045 Other chronic allergic conjunctivitis: Secondary | ICD-10-CM | POA: Diagnosis not present

## 2014-11-19 DIAGNOSIS — J3089 Other allergic rhinitis: Secondary | ICD-10-CM | POA: Diagnosis not present

## 2014-11-20 DIAGNOSIS — J3089 Other allergic rhinitis: Secondary | ICD-10-CM | POA: Diagnosis not present

## 2014-11-24 DIAGNOSIS — J3089 Other allergic rhinitis: Secondary | ICD-10-CM | POA: Diagnosis not present

## 2014-11-24 DIAGNOSIS — J4530 Mild persistent asthma, uncomplicated: Secondary | ICD-10-CM | POA: Diagnosis not present

## 2014-11-24 DIAGNOSIS — H1045 Other chronic allergic conjunctivitis: Secondary | ICD-10-CM | POA: Diagnosis not present

## 2014-11-24 DIAGNOSIS — L300 Nummular dermatitis: Secondary | ICD-10-CM | POA: Diagnosis not present

## 2014-11-24 DIAGNOSIS — K219 Gastro-esophageal reflux disease without esophagitis: Secondary | ICD-10-CM | POA: Diagnosis not present

## 2014-11-27 ENCOUNTER — Other Ambulatory Visit: Payer: Self-pay | Admitting: Allergy and Immunology

## 2014-11-27 ENCOUNTER — Ambulatory Visit
Admission: RE | Admit: 2014-11-27 | Discharge: 2014-11-27 | Disposition: A | Discharge status: Home / Self Care | Payer: Medicare Other | Source: Ambulatory Visit | Attending: Allergy and Immunology | Admitting: Allergy and Immunology

## 2014-11-27 DIAGNOSIS — R05 Cough: Secondary | ICD-10-CM

## 2014-11-27 DIAGNOSIS — R059 Cough, unspecified: Secondary | ICD-10-CM

## 2014-11-27 DIAGNOSIS — R0989 Other specified symptoms and signs involving the circulatory and respiratory systems: Secondary | ICD-10-CM | POA: Diagnosis not present

## 2014-12-17 DIAGNOSIS — M4726 Other spondylosis with radiculopathy, lumbar region: Secondary | ICD-10-CM | POA: Diagnosis not present

## 2014-12-17 DIAGNOSIS — M5136 Other intervertebral disc degeneration, lumbar region: Secondary | ICD-10-CM | POA: Diagnosis not present

## 2014-12-17 DIAGNOSIS — M546 Pain in thoracic spine: Secondary | ICD-10-CM | POA: Diagnosis not present

## 2014-12-17 DIAGNOSIS — M5440 Lumbago with sciatica, unspecified side: Secondary | ICD-10-CM | POA: Diagnosis not present

## 2014-12-17 DIAGNOSIS — M5416 Radiculopathy, lumbar region: Secondary | ICD-10-CM | POA: Diagnosis not present

## 2014-12-17 DIAGNOSIS — M4316 Spondylolisthesis, lumbar region: Secondary | ICD-10-CM | POA: Diagnosis not present

## 2014-12-18 ENCOUNTER — Other Ambulatory Visit: Payer: Self-pay | Admitting: Neurosurgery

## 2014-12-18 DIAGNOSIS — M545 Low back pain: Secondary | ICD-10-CM

## 2014-12-19 ENCOUNTER — Ambulatory Visit
Admission: RE | Admit: 2014-12-19 | Discharge: 2014-12-19 | Disposition: A | Discharge status: Home / Self Care | Payer: Medicare Other | Source: Ambulatory Visit | Attending: Neurosurgery | Admitting: Neurosurgery

## 2014-12-19 DIAGNOSIS — M5126 Other intervertebral disc displacement, lumbar region: Secondary | ICD-10-CM | POA: Diagnosis not present

## 2014-12-19 DIAGNOSIS — M545 Low back pain: Secondary | ICD-10-CM

## 2014-12-19 DIAGNOSIS — M5137 Other intervertebral disc degeneration, lumbosacral region: Secondary | ICD-10-CM | POA: Diagnosis not present

## 2014-12-19 DIAGNOSIS — M47816 Spondylosis without myelopathy or radiculopathy, lumbar region: Secondary | ICD-10-CM | POA: Diagnosis not present

## 2014-12-19 MED ORDER — GADOBENATE DIMEGLUMINE 529 MG/ML IV SOLN
10.0000 mL | Freq: Once | INTRAVENOUS | Status: AC | PRN
  Administered 2014-12-19: 10 mL via INTRAVENOUS

## 2014-12-22 DIAGNOSIS — M4316 Spondylolisthesis, lumbar region: Secondary | ICD-10-CM | POA: Diagnosis not present

## 2014-12-22 DIAGNOSIS — M5136 Other intervertebral disc degeneration, lumbar region: Secondary | ICD-10-CM | POA: Diagnosis not present

## 2014-12-22 DIAGNOSIS — M5416 Radiculopathy, lumbar region: Secondary | ICD-10-CM | POA: Diagnosis not present

## 2014-12-22 DIAGNOSIS — M5440 Lumbago with sciatica, unspecified side: Secondary | ICD-10-CM | POA: Diagnosis not present

## 2014-12-22 DIAGNOSIS — J3089 Other allergic rhinitis: Secondary | ICD-10-CM | POA: Diagnosis not present

## 2014-12-22 DIAGNOSIS — M4726 Other spondylosis with radiculopathy, lumbar region: Secondary | ICD-10-CM | POA: Diagnosis not present

## 2014-12-22 DIAGNOSIS — I10 Essential (primary) hypertension: Secondary | ICD-10-CM | POA: Diagnosis not present

## 2015-01-07 DIAGNOSIS — J3089 Other allergic rhinitis: Secondary | ICD-10-CM | POA: Diagnosis not present

## 2015-01-09 DIAGNOSIS — M5440 Lumbago with sciatica, unspecified side: Secondary | ICD-10-CM | POA: Diagnosis not present

## 2015-01-09 DIAGNOSIS — M4316 Spondylolisthesis, lumbar region: Secondary | ICD-10-CM | POA: Diagnosis not present

## 2015-01-09 DIAGNOSIS — M4726 Other spondylosis with radiculopathy, lumbar region: Secondary | ICD-10-CM | POA: Diagnosis not present

## 2015-01-09 DIAGNOSIS — M5416 Radiculopathy, lumbar region: Secondary | ICD-10-CM | POA: Diagnosis not present

## 2015-01-09 DIAGNOSIS — M5136 Other intervertebral disc degeneration, lumbar region: Secondary | ICD-10-CM | POA: Diagnosis not present

## 2015-01-12 DIAGNOSIS — H25013 Cortical age-related cataract, bilateral: Secondary | ICD-10-CM | POA: Diagnosis not present

## 2015-01-12 DIAGNOSIS — H2513 Age-related nuclear cataract, bilateral: Secondary | ICD-10-CM | POA: Diagnosis not present

## 2015-01-13 DIAGNOSIS — L2489 Irritant contact dermatitis due to other agents: Secondary | ICD-10-CM | POA: Diagnosis not present

## 2015-01-14 ENCOUNTER — Ambulatory Visit: Payer: Medicare Other | Attending: Neurosurgery

## 2015-01-14 DIAGNOSIS — M4726 Other spondylosis with radiculopathy, lumbar region: Secondary | ICD-10-CM | POA: Diagnosis not present

## 2015-01-14 DIAGNOSIS — M6281 Muscle weakness (generalized): Secondary | ICD-10-CM | POA: Diagnosis not present

## 2015-01-14 DIAGNOSIS — M545 Low back pain: Secondary | ICD-10-CM | POA: Insufficient documentation

## 2015-01-14 DIAGNOSIS — L509 Urticaria, unspecified: Secondary | ICD-10-CM | POA: Diagnosis not present

## 2015-01-20 DIAGNOSIS — J3089 Other allergic rhinitis: Secondary | ICD-10-CM | POA: Diagnosis not present

## 2015-01-27 ENCOUNTER — Ambulatory Visit: Payer: Medicare Other | Admitting: Physical Therapy

## 2015-01-27 DIAGNOSIS — M545 Low back pain, unspecified: Secondary | ICD-10-CM

## 2015-01-27 DIAGNOSIS — J3089 Other allergic rhinitis: Secondary | ICD-10-CM | POA: Diagnosis not present

## 2015-01-27 DIAGNOSIS — M6281 Muscle weakness (generalized): Secondary | ICD-10-CM

## 2015-01-27 DIAGNOSIS — J3081 Allergic rhinitis due to animal (cat) (dog) hair and dander: Secondary | ICD-10-CM | POA: Diagnosis not present

## 2015-01-27 DIAGNOSIS — M4726 Other spondylosis with radiculopathy, lumbar region: Secondary | ICD-10-CM | POA: Diagnosis not present

## 2015-01-27 DIAGNOSIS — R6889 Other general symptoms and signs: Secondary | ICD-10-CM

## 2015-01-27 NOTE — Patient Instructions (Signed)
Issued Handout Back Body Mechanics: Correct lifting  Issued Meek Lumbar decompression exercises

## 2015-01-27 NOTE — Therapy (Signed)
[facility] [address] [place], [place], [place], [address] Phone: [phone]   Fax:  [phone]  Physical Therapy Treatment  Patient Details  Name: Madison Kramer MRN: [id number] Date of Birth: Jan 10, 1946 Referring Provider:  Irven Shelling, MD  Encounter Date: 01/27/2015      PT End of Session - 01/27/15 1046    Visit Number 2   Number of Visits 10   Date for PT Re-Evaluation 03/06/15   PT Start Time 0930   PT Stop Time 1093   PT Time Calculation (min) 45 min   Activity Tolerance Patient tolerated treatment well   Behavior During Therapy [facility] for tasks assessed/performed      Past Medical History  Diagnosis Date  . Lichen sclerosus et atrophicus of the vulva 2008    Biopsy-proven  . Osteopenia 11/2011    T score -2.3 FRAX 10.7%/2.2%  . Atrophic vaginitis   . Elevated cholesterol   . Hypertension   . Fibroid     SUBMUCOUS MYOMA  . Back pain   . Asthma   . Allergic rhinitis   . Lactose intolerance   . GERD (gastroesophageal reflux disease)   . Arthritis   . Cataract     Hx; of  . Anxiety   . Pneumonia   . Raynaud's syndrome     "possible"  . Hemoptysis 2007    From reflux pharyngitis  . Insomnia   . Nocturia   . Atypical chest pain     Possibly from DES  . Diverticulosis     Left    Past Surgical History  Procedure Laterality Date  . Breast surgery  2001    REDUCTION MAMMAPLASTY  . Tubal ligation  1987  . Inguinal hernia repair  1971    BILATERAL  . Hysteroscopy  2003    HYSTEROSCOPIC RESECTION OF MYOMA  . Foot surgery  2009  . Tonsillectomy    . Mandible surgery    . Myomectomy    . Colonoscopy    . Lumbar laminectomy/decompression microdiscectomy Right 08/30/2013    Procedure: Right Lumbar four-five Extraforaminal Diskectomy;  Surgeon: Hosie Spangle, MD;  Location: [facility];  Service: Neurosurgery;  Laterality: Right;    There were no vitals taken for this visit.  Visit  Diagnosis:  Bilateral low back pain without sciatica  Activity intolerance  Muscle weakness (generalized)      Subjective Assessment - 01/27/15 1019    Symptoms Constant low back soreness   Limitations Sitting;Standing   Currently in Pain? Yes   Pain Score 3    Pain Location Back   Pain Orientation Other (Comment)  muscle pain low back and right gluteus area   Pain Descriptors / Indicators Constant;Sore   Pain Type Chronic pain   Pain Frequency Constant   Effect of Pain on Daily Activities --  unable to twist, therfore not going back to yoga                    [facility] Adult PT Treatment/Exercise - 01/27/15 0001    Exercises   Exercises Shoulder   Lumbar Exercises: Stretches   Single Knee to Chest Stretch 3 reps;20 seconds   Lumbar Exercises: Supine   Other Supine Lumbar Exercises meeks  Meeks Decompression exercises x 5 with 5 sec hold each   Lumbar Exercises: Sidelying   Clam 5 seconds   Shoulder Exercises: ROM/Strengthening   Pushups 15 reps  triceps strength B UE on  table into half squatting                PT Education - 01/27/15 1040    Education provided Yes   Education Details HEP for low back pain / lifting   Person(s) Educated Patient   Methods Demonstration;Explanation   Comprehension Returned demonstration          PT Short Term Goals - 01/27/15 1052    PT SHORT TERM GOAL #1   Title be independent with initial HEP   Time 3   Period Weeks   Status On-going   PT SHORT TERM GOAL #2   Title report a 30% reduction in LBP with ADL's and work activity   Time 3           PT Long Term Goals - 01/27/15 1054    PT LONG TERM GOAL #1   Title demonstrate and/or verbalize techniques to reduce the risk of re-injury to include info on: posture, body mechanics, lifting   Time 8   Period Weeks   Status On-going   PT LONG TERM GOAL #2   Title be independent with advanced HEP   Time 8   Period Weeks   Status On-going   PT LONG TERM  GOAL #3   Title reduce FOTO to < or = to 37% limitation   Time 8   Period Weeks   Status On-going   PT LONG TERM GOAL #4   Title report a 60% reduction in LBP with ADLs and work activity   Time 8   Period Weeks   Status On-going   PT LONG TERM GOAL #5   Title verbalize understanding of how to progress core strength after discharge   Time 8   Period Weeks   Status On-going               Plan - 01/27/15 1047    Clinical Impression Statement Patient has constant back pain and will continue to benefit from skilled PT   Pt will benefit from skilled therapeutic intervention in order to improve on the following deficits Improper body mechanics;Pain;Decreased strength;Decreased endurance   Rehab Potential Good   Clinical Impairments Affecting Rehab Potential none   PT Frequency 2x / week   PT Duration 6 weeks   PT Treatment/Interventions Moist Heat;Patient/family education;Therapeutic activities;Therapeutic exercise;Ultrasound;Manual techniques;Cryotherapy;Neuromuscular re-education;Electrical Stimulation   PT Next Visit Plan Review Meeks, continue leg press add abdominal strength   Consulted and Agree with Plan of Care Patient        Problem List Patient Active Problem List   Diagnosis Date Noted  . Bloating 08/12/2014  . Irritable bowel syndrome with diarrhea 08/12/2014  . Chest pain 03/30/2014  . Hypertension   . Back pain   . Extrinsic asthma 06/16/2012  . Fibroid   . Lichen sclerosus et atrophicus of the vulva   . Osteopenia   . Atrophic vaginitis   . CHRONIC RHINITIS 11/23/2009  . PULMONARY INFILTRATE INCLUDES (EOSINOPHILIA) 10/27/2009  . BRONCHITIS, ACUTE 10/23/2009  . HYPERLIPIDEMIA 12/22/2008  . Cough 12/22/2008  . HEMOPTYSIS 12/22/2008    NAUMANN-HOUEGNIFIO,Ever Gustafson PTA 01/27/2015, 11:03 AM  [facility] [address] [place], [place], [place], [address] Phone: [phone]   Fax:  [phone]

## 2015-01-29 ENCOUNTER — Ambulatory Visit: Payer: Medicare Other | Admitting: Physical Therapy

## 2015-01-29 ENCOUNTER — Encounter: Payer: Self-pay | Admitting: Physical Therapy

## 2015-01-29 DIAGNOSIS — M6281 Muscle weakness (generalized): Secondary | ICD-10-CM | POA: Diagnosis not present

## 2015-01-29 DIAGNOSIS — J3089 Other allergic rhinitis: Secondary | ICD-10-CM | POA: Diagnosis not present

## 2015-01-29 DIAGNOSIS — M545 Low back pain, unspecified: Secondary | ICD-10-CM

## 2015-01-29 DIAGNOSIS — M4726 Other spondylosis with radiculopathy, lumbar region: Secondary | ICD-10-CM | POA: Diagnosis not present

## 2015-01-29 DIAGNOSIS — R6889 Other general symptoms and signs: Secondary | ICD-10-CM

## 2015-01-29 NOTE — Therapy (Signed)
[facility] [address] [place], [place], [place], [address] Phone: [phone]   Fax:  [phone]  Physical Therapy Treatment  Patient Details  Name: Madison Kramer MRN: [id number] Date of Birth: 1946-01-08 Referring Provider:  Irven Shelling, MD  Encounter Date: 01/29/2015    Past Medical History  Diagnosis Date  . Lichen sclerosus et atrophicus of the vulva 2008    Biopsy-proven  . Osteopenia 11/2011    T score -2.3 FRAX 10.7%/2.2%  . Atrophic vaginitis   . Elevated cholesterol   . Hypertension   . Fibroid     SUBMUCOUS MYOMA  . Back pain   . Asthma   . Allergic rhinitis   . Lactose intolerance   . GERD (gastroesophageal reflux disease)   . Arthritis   . Cataract     Hx; of  . Anxiety   . Pneumonia   . Raynaud's syndrome     "possible"  . Hemoptysis 2007    From reflux pharyngitis  . Insomnia   . Nocturia   . Atypical chest pain     Possibly from DES  . Diverticulosis     Left    Past Surgical History  Procedure Laterality Date  . Breast surgery  2001    REDUCTION MAMMAPLASTY  . Tubal ligation  1987  . Inguinal hernia repair  1971    BILATERAL  . Hysteroscopy  2003    HYSTEROSCOPIC RESECTION OF MYOMA  . Foot surgery  2009  . Tonsillectomy    . Mandible surgery    . Myomectomy    . Colonoscopy    . Lumbar laminectomy/decompression microdiscectomy Right 08/30/2013    Procedure: Right Lumbar four-five Extraforaminal Diskectomy;  Surgeon: Hosie Spangle, MD;  Location: [facility];  Service: Neurosurgery;  Laterality: Right;    There were no vitals taken for this visit.  Visit Diagnosis:  Muscle weakness (generalized)  Activity intolerance  Bilateral low back pain without sciatica      Subjective Assessment - 01/29/15 0944    Symptoms low back   Limitations Other (comment)   Currently in Pain? Yes   Pain Score 1    Pain Location Back   Pain Orientation Mid   Pain Descriptors  / Indicators Sore   Pain Type Chronic pain   Pain Onset More than a month ago   Pain Frequency Intermittent   Pain Relieving Factors change position, ice or heat   Effect of Pain on Daily Activities rolonged sitting   Multiple Pain Sites No                    [facility] Adult PT Treatment/Exercise - 01/29/15 0001    Exercises   Exercises Knee/Hip   Lumbar Exercises: Stretches   Active Hamstring Stretch 3 reps;20 seconds  in supine and at the doorframe each leg   Single Knee to Chest Stretch 3 reps;20 seconds   Lumbar Exercises: Aerobic   Stationary Bike 88min   Lumbar Exercises: Machines for Strengthening   Leg Press bil LE  55# 1 x10 & 60# 2 x10   Lumbar Exercises: Prone   Other Prone Lumbar Exercises prayer position   Manual Therapy   Manual Therapy Other (comment)   Other Manual Therapy --  STW to bil hamstrings with PROM to increase elongation                PT Education - 01/29/15 1043    Education provided Yes  Education Details Hamstring stretch on doorframe   Person(s) Educated Patient   Methods Explanation;Demonstration;Handout   Comprehension Returned demonstration          PT Short Term Goals - 01/27/15 1052    PT SHORT TERM GOAL #1   Title be independent with initial HEP   Time 3   Period Weeks   Status On-going   PT SHORT TERM GOAL #2   Title report a 30% reduction in LBP with ADL's and work activity   Time 3           PT Long Term Goals - 01/27/15 1054    PT LONG TERM GOAL #1   Title demonstrate and/or verbalize techniques to reduce the risk of re-injury to include info on: posture, body mechanics, lifting   Time 8   Period Weeks   Status On-going   PT LONG TERM GOAL #2   Title be independent with advanced HEP   Time 8   Period Weeks   Status On-going   PT LONG TERM GOAL #3   Title reduce FOTO to < or = to 37% limitation   Time 8   Period Weeks   Status On-going   PT LONG TERM GOAL #4   Title report a 60% reduction  in LBP with ADLs and work activity   Time 8   Period Weeks   Status On-going   PT LONG TERM GOAL #5   Title verbalize understanding of how to progress core strength after discharge   Time 8   Period Weeks   Status On-going               Problem List Patient Active Problem List   Diagnosis Date Noted  . Bloating 08/12/2014  . Irritable bowel syndrome with diarrhea 08/12/2014  . Chest pain 03/30/2014  . Hypertension   . Back pain   . Extrinsic asthma 06/16/2012  . Fibroid   . Lichen sclerosus et atrophicus of the vulva   . Osteopenia   . Atrophic vaginitis   . CHRONIC RHINITIS 11/23/2009  . PULMONARY INFILTRATE INCLUDES (EOSINOPHILIA) 10/27/2009  . BRONCHITIS, ACUTE 10/23/2009  . HYPERLIPIDEMIA 12/22/2008  . Cough 12/22/2008  . HEMOPTYSIS 12/22/2008    NAUMANN-HOUEGNIFIO,Allana Shrestha PTA 01/29/2015, 10:50 AM  [facility] [address] [place], [place], [place], [address] Phone: [phone]   Fax:  [phone]

## 2015-01-29 NOTE — Patient Instructions (Signed)
Stretching: Hamstring (Supine)   Place one leg on doorframe as straight as possible Opposite leg is bend (due to your hamstring limitations) See below and then repeat with opposite leg  Supporting right thigh behind knee, slowly straighten knee until stretch is felt in back of thigh. Hold _20___ seconds. Repeat _3___ times per set. Do __1__ sets per session. Do __2-3__ sessions per day.  http://orth.exer.us/657   Copyright  VHI. All rights reserved.

## 2015-02-03 ENCOUNTER — Ambulatory Visit: Payer: Medicare Other | Admitting: Physical Therapy

## 2015-02-03 DIAGNOSIS — J3089 Other allergic rhinitis: Secondary | ICD-10-CM | POA: Diagnosis not present

## 2015-02-05 ENCOUNTER — Ambulatory Visit: Payer: Medicare Other | Admitting: Physical Therapy

## 2015-02-06 DIAGNOSIS — J3089 Other allergic rhinitis: Secondary | ICD-10-CM | POA: Diagnosis not present

## 2015-02-10 ENCOUNTER — Ambulatory Visit: Payer: Medicare Other | Attending: Neurosurgery | Admitting: Physical Therapy

## 2015-02-10 ENCOUNTER — Encounter: Payer: Self-pay | Admitting: Physical Therapy

## 2015-02-10 DIAGNOSIS — M545 Low back pain, unspecified: Secondary | ICD-10-CM

## 2015-02-10 DIAGNOSIS — R6889 Other general symptoms and signs: Secondary | ICD-10-CM

## 2015-02-10 DIAGNOSIS — M6281 Muscle weakness (generalized): Secondary | ICD-10-CM | POA: Diagnosis not present

## 2015-02-10 DIAGNOSIS — M4726 Other spondylosis with radiculopathy, lumbar region: Secondary | ICD-10-CM | POA: Diagnosis not present

## 2015-02-10 NOTE — Therapy (Signed)
[facility] [address], [place], [place], [address] Phone: [phone]   Fax:  [phone]  Physical Therapy Treatment  Patient Details  Name: Madison Kramer MRN: [id number] Date of Birth: 15-Jul-1946 Referring Provider:  Irven Shelling, MD  Encounter Date: 02/10/2015      PT End of Session - 02/10/15 1320    Date for PT Re-Evaluation 03/06/15      Past Medical History  Diagnosis Date  . Lichen sclerosus et atrophicus of the vulva 2008    Biopsy-proven  . Osteopenia 11/2011    T score -2.3 FRAX 10.7%/2.2%  . Atrophic vaginitis   . Elevated cholesterol   . Hypertension   . Fibroid     SUBMUCOUS MYOMA  . Back pain   . Asthma   . Allergic rhinitis   . Lactose intolerance   . GERD (gastroesophageal reflux disease)   . Arthritis   . Cataract     Hx; of  . Anxiety   . Pneumonia   . Raynaud's syndrome     "possible"  . Hemoptysis 2007    From reflux pharyngitis  . Insomnia   . Nocturia   . Atypical chest pain     Possibly from DES  . Diverticulosis     Left    Past Surgical History  Procedure Laterality Date  . Breast surgery  2001    REDUCTION MAMMAPLASTY  . Tubal ligation  1987  . Inguinal hernia repair  1971    BILATERAL  . Hysteroscopy  2003    HYSTEROSCOPIC RESECTION OF MYOMA  . Foot surgery  2009  . Tonsillectomy    . Mandible surgery    . Myomectomy    . Colonoscopy    . Lumbar laminectomy/decompression microdiscectomy Right 08/30/2013    Procedure: Right Lumbar four-five Extraforaminal Diskectomy;  Surgeon: Hosie Spangle, MD;  Location: [facility];  Service: Neurosurgery;  Laterality: Right;    There were no vitals taken for this visit.  Visit Diagnosis:  Muscle weakness (generalized)  Activity intolerance  Bilateral low back pain without sciatica      Subjective Assessment - 02/10/15 1228    Symptoms low back pain, and tight Hamstrings   How long can you stand  comfortably? standing for prolonged time > 59min can cause back pain    How long can you walk comfortably? it seems to be not limited, able to walk for 62min.outside   Patient Stated Goals Pt wishes to improve core and back muscles to prevent back pain and improve functional activities   Currently in Pain? No/denies   Multiple Pain Sites No                    [facility] Adult PT Treatment/Exercise - 02/10/15 0001    Lumbar Exercises: Stretches   Active Hamstring Stretch 3 reps;20 seconds  in supine and at the doorframe each leg   Single Knee to Chest Stretch 3 reps;20 seconds   Lumbar Exercises: Aerobic   Stationary Bike 46min   Lumbar Exercises: Machines for Strengthening   Leg Press Seat #5 B LE 60# 3 x10  bil   Lumbar Exercises: Prone   Other Prone Lumbar Exercises prayer forward x 3 with 20"   Lumbar Exercises: Quadruped   Single Arm Raise 5 reps;3 seconds  needs reminders for pacing, focus on abdominal activation   Straight Leg Raise 5 reps;3 seconds  with focus on abdominal bracing   Opposite  Arm/Leg Raise Right arm/Left leg;Left arm/Right leg   Other Quadruped Lumbar Exercises unilateral LE lift with knee bend x5 each leg   Manual Therapy   Manual Therapy Other (comment)   Other Manual Therapy --  STW to bil hamstrings with PROM to increase elongation                PT Education - 02/10/15 1319    Methods Explanation;Demonstration          PT Short Term Goals - 02/10/15 1322    PT SHORT TERM GOAL #1   Title be independent with initial HEP   Status Achieved           PT Long Term Goals - 01/27/15 1054    PT LONG TERM GOAL #1   Title demonstrate and/or verbalize techniques to reduce the risk of re-injury to include info on: posture, body mechanics, lifting   Time 8   Period Weeks   Status On-going   PT LONG TERM GOAL #2   Title be independent with advanced HEP   Time 8   Period Weeks   Status On-going   PT LONG TERM GOAL #3   Title  reduce FOTO to < or = to 37% limitation   Time 8   Period Weeks   Status On-going   PT LONG TERM GOAL #4   Title report a 60% reduction in LBP with ADLs and work activity   Time 8   Period Weeks   Status On-going   PT LONG TERM GOAL #5   Title verbalize understanding of how to progress core strength after discharge   Time 8   Period Weeks   Status On-going               Plan - 02/10/15 1320    Clinical Impression Statement Patient has constant back pain and will continue to benefit from skilled PT   Pt will benefit from skilled therapeutic intervention in order to improve on the following deficits Improper body mechanics;Pain;Decreased strength;Decreased endurance   Rehab Potential Good   Clinical Impairments Affecting Rehab Potential none   PT Frequency 2x / week   PT Duration 6 weeks   PT Treatment/Interventions Moist Heat;Patient/family education;Therapeutic activities;Therapeutic exercise;Ultrasound;Manual techniques;Cryotherapy;Neuromuscular re-education;Electrical Stimulation   PT Next Visit Plan Review quadrupped exs, leg press, may add abdoimnal strength   PT Home Exercise Plan back strength quadrupped   Consulted and Agree with Plan of Care Patient        Problem List Patient Active Problem List   Diagnosis Date Noted  . Bloating 08/12/2014  . Irritable bowel syndrome with diarrhea 08/12/2014  . Chest pain 03/30/2014  . Hypertension   . Back pain   . Extrinsic asthma 06/16/2012  . Fibroid   . Lichen sclerosus et atrophicus of the vulva   . Osteopenia   . Atrophic vaginitis   . CHRONIC RHINITIS 11/23/2009  . PULMONARY INFILTRATE INCLUDES (EOSINOPHILIA) 10/27/2009  . BRONCHITIS, ACUTE 10/23/2009  . HYPERLIPIDEMIA 12/22/2008  . Cough 12/22/2008  . HEMOPTYSIS 12/22/2008    NAUMANN-HOUEGNIFIO,Ilana Prezioso PTA 02/10/2015, 1:31 PM  [facility] [address] [place], [place], [address] Phone:  [phone]   Fax:  [phone]

## 2015-02-10 NOTE — Patient Instructions (Addendum)
Healthy Back Strengthening - Back Extension on All Fours   Start on hands and knees, keeping them apart. Straighten right leg and left arm at the same time. Hold _3___ seconds. Switch immediately and repeat with left leg and right arm. Do ___5_ times. Increase repetitions gradually up to 10____. Increase each hold gradually up to _5___ seconds.  Copyright  VHI. All rights reserved.  Healthy Back Strengthening - Back Extension on All Fours   Start on hands and knees, keeping them apart. Straighten right leg and left arm at the same time. Hold ____ seconds. Switch immediately and repeat with left leg and right arm. Do ____ times. Increase repetitions gradually up to ____. Increase each hold gradually up to ____ seconds.  Copyright  VHI. All rights reserved.

## 2015-02-13 ENCOUNTER — Ambulatory Visit: Payer: Medicare Other | Admitting: Physical Therapy

## 2015-02-13 ENCOUNTER — Encounter: Payer: Self-pay | Admitting: Physical Therapy

## 2015-02-13 DIAGNOSIS — M4726 Other spondylosis with radiculopathy, lumbar region: Secondary | ICD-10-CM | POA: Diagnosis not present

## 2015-02-13 DIAGNOSIS — M545 Low back pain, unspecified: Secondary | ICD-10-CM

## 2015-02-13 DIAGNOSIS — M6281 Muscle weakness (generalized): Secondary | ICD-10-CM | POA: Diagnosis not present

## 2015-02-13 NOTE — Patient Instructions (Signed)

## 2015-02-13 NOTE — Therapy (Signed)
[facility] [address] [place], [place], [address] Phone: [phone]   Fax:  [phone]  Physical Therapy Treatment  Patient Details  Name: Madison Kramer MRN: [id number] Date of Birth: 04/22/1946 Referring Provider:  Irven Shelling, MD  Encounter Date: 02/13/2015      PT End of Session - 02/13/15 1046    Visit Number 4   Number of Visits 10   Date for PT Re-Evaluation 03/06/15   PT Start Time 2947   PT Stop Time 1100   PT Time Calculation (min) 45 min   Activity Tolerance Patient tolerated treatment well   Behavior During Therapy [facility] for tasks assessed/performed      Past Medical History  Diagnosis Date  . Lichen sclerosus et atrophicus of the vulva 2008    Biopsy-proven  . Osteopenia 11/2011    T score -2.3 FRAX 10.7%/2.2%  . Atrophic vaginitis   . Elevated cholesterol   . Hypertension   . Fibroid     SUBMUCOUS MYOMA  . Back pain   . Asthma   . Allergic rhinitis   . Lactose intolerance   . GERD (gastroesophageal reflux disease)   . Arthritis   . Cataract     Hx; of  . Anxiety   . Pneumonia   . Raynaud's syndrome     "possible"  . Hemoptysis 2007    From reflux pharyngitis  . Insomnia   . Nocturia   . Atypical chest pain     Possibly from DES  . Diverticulosis     Left    Past Surgical History  Procedure Laterality Date  . Breast surgery  2001    REDUCTION MAMMAPLASTY  . Tubal ligation  1987  . Inguinal hernia repair  1971    BILATERAL  . Hysteroscopy  2003    HYSTEROSCOPIC RESECTION OF MYOMA  . Foot surgery  2009  . Tonsillectomy    . Mandible surgery    . Myomectomy    . Colonoscopy    . Lumbar laminectomy/decompression microdiscectomy Right 08/30/2013    Procedure: Right Lumbar four-five Extraforaminal Diskectomy;  Surgeon: Hosie Spangle, MD;  Location: [facility];  Service: Neurosurgery;  Laterality: Right;    There were no vitals taken for this  visit.  Visit Diagnosis:  Muscle weakness (generalized)  Bilateral low back pain without sciatica      Subjective Assessment - 02/13/15 1020    Symptoms Did her exercises this AM and swam already.   Currently in Pain? No/denies   Pain Relieving Factors Changing  positions, ice, heat   Effect of Pain on Daily Activities too much sitting   Multiple Pain Sites No                    [facility] Adult PT Treatment/Exercise - 02/13/15 0001    Lumbar Exercises: Aerobic   Stationary Bike 10 min   Lumbar Exercises: Supine   Ab Set --  TA work: finding and contracting without compensating.   Heel Slides 5 reps  BIl   Bent Knee Raise --  5x bil   Other Supine Lumbar Exercises Single leg drop out 5x bil                PT Education - 02/13/15 1046    Education Details Also added leg movements to HEP          PT Short Term Goals - 02/13/15 1052    PT  SHORT TERM GOAL #1   Title be independent with initial HEP   Time 3   Period Weeks   Status Achieved   PT SHORT TERM GOAL #2   Title report a 30% reduction in LBP with ADL's and work activity   Time 3   Period Weeks   Status Achieved  75% reduction           PT Long Term Goals - 02/13/15 1053    PT LONG TERM GOAL #1   Title demonstrate and/or verbalize techniques to reduce the risk of re-injury to include info on: posture, body mechanics, lifting   Time 8   Period Weeks   Status On-going   PT LONG TERM GOAL #2   Title be independent with advanced HEP   Time 8   Period Weeks   Status On-going  Working towards   [place] #3   Title reduce FOTO to < or = to 37% limitation   Time 8   Period Weeks   Status On-going   PT LONG TERM GOAL #4   Title report a 60% reduction in LBP with ADLs and work activity   Period Weeks   Status Achieved  75%               Plan - 02/13/15 1049    Clinical Impression Statement Patient did well with building foundational core work: good technique    Pt will benefit from skilled therapeutic intervention in order to improve on the following deficits Improper body mechanics;Pain;Decreased strength;Decreased endurance   Rehab Potential Good   Clinical Impairments Affecting Rehab Potential none   PT Frequency 2x / week   PT Treatment/Interventions Moist Heat;Patient/family education;Therapeutic activities;Therapeutic exercise;Ultrasound;Manual techniques;Cryotherapy;Neuromuscular re-education;Electrical Stimulation   PT Next Visit Plan review exercises, continue with core strength   Consulted and Agree with Plan of Care Patient        Problem List Patient Active Problem List   Diagnosis Date Noted  . Bloating 08/12/2014  . Irritable bowel syndrome with diarrhea 08/12/2014  . Chest pain 03/30/2014  . Hypertension   . Back pain   . Extrinsic asthma 06/16/2012  . Fibroid   . Lichen sclerosus et atrophicus of the vulva   . Osteopenia   . Atrophic vaginitis   . CHRONIC RHINITIS 11/23/2009  . PULMONARY INFILTRATE INCLUDES (EOSINOPHILIA) 10/27/2009  . BRONCHITIS, ACUTE 10/23/2009  . HYPERLIPIDEMIA 12/22/2008  . Cough 12/22/2008  . HEMOPTYSIS 12/22/2008    Mechele Kittleson,PTA 02/13/2015, 10:58 AM  [facility] [address], [place], [place], [address] Phone: [phone]   Fax:  [phone]    Finding and contracting TA given for HEP. Handout given, verbal instruction, verbal/demonstration understanding.

## 2015-02-16 ENCOUNTER — Ambulatory Visit: Payer: Medicare Other | Admitting: Physical Therapy

## 2015-02-16 ENCOUNTER — Encounter: Payer: Self-pay | Admitting: Physical Therapy

## 2015-02-16 DIAGNOSIS — M6281 Muscle weakness (generalized): Secondary | ICD-10-CM | POA: Diagnosis not present

## 2015-02-16 DIAGNOSIS — M545 Low back pain, unspecified: Secondary | ICD-10-CM

## 2015-02-16 DIAGNOSIS — M4726 Other spondylosis with radiculopathy, lumbar region: Secondary | ICD-10-CM | POA: Diagnosis not present

## 2015-02-16 DIAGNOSIS — Z1231 Encounter for screening mammogram for malignant neoplasm of breast: Secondary | ICD-10-CM | POA: Diagnosis not present

## 2015-02-16 DIAGNOSIS — R6889 Other general symptoms and signs: Secondary | ICD-10-CM

## 2015-02-16 NOTE — Patient Instructions (Signed)
Bridging   Slowly raise buttocks from floor, keeping stomach tight. Repeat ____ times per set. Do ____ sets per session. Do ____ sessions per day.  http://orth.exer.us/1096   Copyright  VHI. All rights reserved.  Bridging   Slowly raise buttocks from floor, keeping stomach tight. Ball between knees. Repeat __10__ times per set. Do __2__ sets per session. Do _1-2___ sessions per day. Inhale prepare, exhale as you lift. PAy attention to level pelvis and symmmtery.   http://orth.exer.us/1096   Copyright  VHI. All rights reserved.  Single Leg Circle   Lie on back, one leg bent, other leg straight up. Inhale, circling leg across body, and exhale while circling down and around to beginning. Maintain still pelvis; avoid rocking. Keep circle small. Repeat ____ times clockwise, then counterclockwise. Repeat with other leg. Do ____ sessions per day.  Copyright  VHI. All rights reserved.  Single Leg Circle   Lie on back, one leg bent, other leg straight up. Inhale, circling leg across body, and exhale while circling down and around to beginning. Maintain still pelvis; avoid rocking. Keep circle small. Repeat __5__ times clockwise, then counterclockwise. Repeat with other leg. Do __1__ sessions per day.  Copyright  VHI. All rights reserved.  Bent Leg Lift (Hook-Lying)   Tighten stomach and slowly raise right leg ____ inches from floor. Keep trunk rigid. Hold ____ seconds. Repeat ____ times per set. Do ____ sets per session. Do ____ sessions per day.  http://orth.exer.us/1090   Copyright  VHI. All rights reserved.  Bent Leg Lift (Hook-Lying)   Tighten stomach and slowly raise right leg _2-4___ inches from floor. Keep trunk rigid. Hold _2___ seconds. Then lift the other leg up, hold 3 sec.  Repeat ___5-10_ times per set. Do _1___ sets per session. Do ___1_ sessions per day.  http://orth.exer.us/1090   Copyright  VHI. All rights reserved.

## 2015-02-16 NOTE — Therapy (Signed)
[facility] [address] [place], [place], [address] Phone: [phone]   Fax:  [phone]  Physical Therapy Treatment  Patient Details  Name: Madison Kramer MRN: [id number] Date of Birth: 01-12-1946 Referring Provider:  [name], MD  Encounter Date: 02/16/2015      PT End of Session - 02/16/15 1447    Visit Number 5   Number of Visits 10   Date for PT Re-Evaluation 03/06/15   PT Start Time 1400   PT Stop Time 1448   PT Time Calculation (min) 48 min   Activity Tolerance Patient tolerated treatment well      Past Medical History  Diagnosis Date  . Lichen sclerosus et atrophicus of the vulva 2008    Biopsy-proven  . Osteopenia 11/2011    T score -2.3 FRAX 10.7%/2.2%  . Atrophic vaginitis   . Elevated cholesterol   . Hypertension   . Fibroid     SUBMUCOUS MYOMA  . Back pain   . Asthma   . Allergic rhinitis   . Lactose intolerance   . GERD (gastroesophageal reflux disease)   . Arthritis   . Cataract     Hx; of  . Anxiety   . Pneumonia   . Raynaud's syndrome     "possible"  . Hemoptysis 2007    From reflux pharyngitis  . Insomnia   . Nocturia   . Atypical chest pain     Possibly from DES  . Diverticulosis     Left    Past Surgical History  Procedure Laterality Date  . Breast surgery  2001    REDUCTION MAMMAPLASTY  . Tubal ligation  1987  . Inguinal hernia repair  1971    BILATERAL  . Hysteroscopy  2003    HYSTEROSCOPIC RESECTION OF MYOMA  . Foot surgery  2009  . Tonsillectomy    . Mandible surgery    . Myomectomy    . Colonoscopy    . Lumbar laminectomy/decompression microdiscectomy Right 08/30/2013    Procedure: Right Lumbar four-five Extraforaminal Diskectomy;  Surgeon: Hosie Spangle, MD;  Location: [facility];  Service: Neurosurgery;  Laterality: Right;    There were no vitals taken for this visit.  Visit Diagnosis:  Muscle weakness (generalized)  Activity  intolerance  Bilateral low back pain without sciatica      Subjective Assessment - 02/16/15 1401    Symptoms Continues all exercises, gym work, swimming, Intermittent LBP   Currently in Pain? No/denies   Pain Relieving Factors changing positions   Multiple Pain Sites No                    [facility] Adult PT Treatment/Exercise - 02/16/15 0001    Lumbar Exercises: Aerobic   Elliptical 10 min L3 R2   Lumbar Exercises: Supine   Heel Slides 5 reps  BIl   Bent Knee Raise --  5x bil   Dead Bug 10 reps  Added to HEP   Bridge 10 reps;5 seconds   Bridge Limitations used ball bt knees  , Small ROM added to HEP   Other Supine Lumbar Exercises Single leg drop out 5x bil   Other Supine Lumbar Exercises Pilates single leg cirlcles 5x each leg, given for HEP.                 PT Education - 02/16/15 1436    Education provided Yes   Education Details HEP   Person(s) Educated Patient  Methods Explanation;Demonstration;Verbal cues   Comprehension Verbalized understanding;Returned demonstration          PT Short Term Goals - 02/16/15 1449    PT SHORT TERM GOAL #1   Title be independent with initial HEP   Time 3   Period Weeks   Status Achieved   PT SHORT TERM GOAL #2   Title report a 30% reduction in LBP with ADL's and work activity   Time 3   Period Weeks   Status Achieved           PT Long Term Goals - 02/16/15 1449    PT LONG TERM GOAL #1   Title demonstrate and/or verbalize techniques to reduce the risk of re-injury to include info on: posture, body mechanics, lifting   Time 8   Period Weeks   Status On-going  Educated pt in Economist for ADLS today., but ongoing   PT LONG TERM GOAL #2   Title be independent with advanced HEP   Time 8   Period Weeks   Status On-going   PT LONG TERM GOAL #3   Time 8   Status On-going   PT LONG TERM GOAL #4   Title report a 60% reduction in LBP with ADLs and work activity   Time 8   Period Weeks                Plan - 02/16/15 1447    Clinical Impression Statement Patient performed intermediate supine exercises well but thinks the mat may have bothered her.    Pt will benefit from skilled therapeutic intervention in order to improve on the following deficits Improper body mechanics;Pain;Decreased strength;Decreased endurance   Rehab Potential Good   PT Frequency 2x / week   PT Duration 6 weeks   PT Treatment/Interventions Moist Heat;Patient/family education;Therapeutic activities;Therapeutic exercise;Ultrasound;Manual techniques;Cryotherapy;Neuromuscular re-education;Electrical Stimulation   PT Next Visit Plan review exercises, continue with core strength   PT Home Exercise Plan Review new exercises   Consulted and Agree with Plan of Care Patient        Problem List Patient Active Problem List   Diagnosis Date Noted  . Bloating 08/12/2014  . Irritable bowel syndrome with diarrhea 08/12/2014  . Chest pain 03/30/2014  . Hypertension   . Back pain   . Extrinsic asthma 06/16/2012  . Fibroid   . Lichen sclerosus et atrophicus of the vulva   . Osteopenia   . Atrophic vaginitis   . CHRONIC RHINITIS 11/23/2009  . PULMONARY INFILTRATE INCLUDES (EOSINOPHILIA) 10/27/2009  . BRONCHITIS, ACUTE 10/23/2009  . HYPERLIPIDEMIA 12/22/2008  . Cough 12/22/2008  . HEMOPTYSIS 12/22/2008    COCHRAN,JENNIFER, PTA 02/16/2015, 2:53 PM  [facility] [address], [place], [place], [address] Phone: [phone]   Fax:  [phone]    Patient was educated in ADL body mechanics that are lumbar protected. PTA demonstrated, pt verbally understood concepts.

## 2015-02-17 ENCOUNTER — Encounter: Payer: Self-pay | Admitting: Gynecology

## 2015-02-18 DIAGNOSIS — N63 Unspecified lump in breast: Secondary | ICD-10-CM | POA: Diagnosis not present

## 2015-02-18 DIAGNOSIS — Z803 Family history of malignant neoplasm of breast: Secondary | ICD-10-CM | POA: Diagnosis not present

## 2015-02-18 DIAGNOSIS — R928 Other abnormal and inconclusive findings on diagnostic imaging of breast: Secondary | ICD-10-CM | POA: Diagnosis not present

## 2015-02-18 DIAGNOSIS — R922 Inconclusive mammogram: Secondary | ICD-10-CM | POA: Diagnosis not present

## 2015-02-19 ENCOUNTER — Encounter: Payer: Self-pay | Admitting: Gynecology

## 2015-02-19 DIAGNOSIS — J3089 Other allergic rhinitis: Secondary | ICD-10-CM | POA: Diagnosis not present

## 2015-02-24 ENCOUNTER — Ambulatory Visit: Payer: Medicare Other | Admitting: Physical Therapy

## 2015-03-02 ENCOUNTER — Ambulatory Visit: Payer: Medicare Other

## 2015-03-02 DIAGNOSIS — M6281 Muscle weakness (generalized): Secondary | ICD-10-CM | POA: Diagnosis not present

## 2015-03-02 DIAGNOSIS — R6889 Other general symptoms and signs: Secondary | ICD-10-CM

## 2015-03-02 DIAGNOSIS — M545 Low back pain, unspecified: Secondary | ICD-10-CM

## 2015-03-02 DIAGNOSIS — J3081 Allergic rhinitis due to animal (cat) (dog) hair and dander: Secondary | ICD-10-CM | POA: Diagnosis not present

## 2015-03-02 DIAGNOSIS — J3089 Other allergic rhinitis: Secondary | ICD-10-CM | POA: Diagnosis not present

## 2015-03-02 DIAGNOSIS — M4726 Other spondylosis with radiculopathy, lumbar region: Secondary | ICD-10-CM | POA: Diagnosis not present

## 2015-03-02 NOTE — Therapy (Signed)
[facility] [address], [address] [place], [place], [address] Phone: [phone]   Fax:  [phone]  Physical Therapy Treatment  Patient Details  Name: Madison Kramer MRN: [id number] Date of Birth: 10/01/1946 Referring Provider:  Nudelman, Robert, MD  Encounter Date: 03/02/2015      PT End of Session - 03/02/15 1556    Visit Number 6   PT Start Time 1531   PT Stop Time 1611   PT Time Calculation (min) 40 min   Activity Tolerance Patient tolerated treatment well      Past Medical History  Diagnosis Date  . Lichen sclerosus et atrophicus of the vulva 2008    Biopsy-proven  . Osteopenia 11/2011    T score -2.3 FRAX 10.7%/2.2%  . Atrophic vaginitis   . Elevated cholesterol   . Hypertension   . Fibroid     SUBMUCOUS MYOMA  . Back pain   . Asthma   . Allergic rhinitis   . Lactose intolerance   . GERD (gastroesophageal reflux disease)   . Arthritis   . Cataract     Hx; of  . Anxiety   . Pneumonia   . Raynaud's syndrome     "possible"  . Hemoptysis 2007    From reflux pharyngitis  . Insomnia   . Nocturia   . Atypical chest pain     Possibly from DES  . Diverticulosis     Left    Past Surgical History  Procedure Laterality Date  . Breast surgery  2001    REDUCTION MAMMAPLASTY  . Tubal ligation  1987  . Inguinal hernia repair  1971    BILATERAL  . Hysteroscopy  2003    HYSTEROSCOPIC RESECTION OF MYOMA  . Foot surgery  2009  . Tonsillectomy    . Mandible surgery    . Myomectomy    . Colonoscopy    . Lumbar laminectomy/decompression microdiscectomy Right 08/30/2013    Procedure: Right Lumbar four-five Extraforaminal Diskectomy;  Surgeon: Robert W Nudelman, MD;  Location: [facility];  Service: Neurosurgery;  Laterality: Right;    There were no vitals filed for this visit.  Visit Diagnosis:  Muscle weakness (generalized)  Activity intolerance  Bilateral low back pain without sciatica          [facility] PT Assessment - 03/02/15 0001    Assessment   Medical Diagnosis OA of spine with radiculopathy (lumbar)   Observation/Other Assessments   Focus on Therapeutic Outcomes (FOTO)  34% limitation                   [facility] Adult PT Treatment/Exercise - 03/02/15 0001    Lumbar Exercises: Stretches   Active Hamstring Stretch 3 reps;20 seconds  use of strap   Lumbar Exercises: Aerobic   Elliptical 10 min L3 R2  PT present to discuss progress   Lumbar Exercises: Supine   Dead Bug 10 reps  Added to HEP   Bridge 10 reps;5 seconds  good return demo of each   Bridge Limitations used ball bt knees  , Small ROM added to HEP   Lumbar Exercises: Quadruped   Single Arm Raise 5 reps;3 seconds  needs reminders for pacing, focus on abdominal activation   Straight Leg Raise 5 reps;3 seconds  with focus on abdominal bracing   Opposite Arm/Leg Raise Right arm/Left leg;Left arm/Right leg   Other Quadruped Lumbar Exercises unilateral LE lift with knee bend x5 each leg                    PT Short Term Goals - 02/16/15 1449    PT SHORT TERM GOAL #1   Title be independent with initial HEP   Time 3   Period Weeks   Status Achieved   PT SHORT TERM GOAL #2   Title report a 30% reduction in LBP with ADL's and work activity   Time 3   Period Weeks   Status Achieved           PT Long Term Goals - 03/02/15 1539    PT LONG TERM GOAL #1   Title demonstrate and/or verbalize techniques to reduce the risk of re-injury to include info on: posture, body mechanics, lifting   Status Achieved   PT LONG TERM GOAL #2   Title be independent with advanced HEP   Period Weeks   Status Achieved   PT LONG TERM GOAL #3   Title reduce FOTO to < or = to 37% limitation   Status Achieved  34% limitation   PT LONG TERM GOAL #4   Title report a 60% reduction in LBP with ADLs and work activity   Status Achieved  >75% overall improvement   PT LONG TERM GOAL #5   Title verbalize  understanding of how to progress core strength after discharge   Time 8   Period Weeks   Status Achieved  Pt has an advanced HEP and understands how to progress               Plan - 03/02/15 1557    Clinical Impression Statement Pt is independent in advanced HEP for core strength.  Pt has met goals and will discharge to HEP.   PT Next Visit Plan D/C to HEP   Consulted and Agree with Plan of Care Patient          G-Codes - 03/02/15 1612    Functional Assessment Tool Used FOTO:  34% limitation   Functional Limitation Other PT primary   Other PT Primary Goal Status (G8991) At least 20 percent but less than 40 percent impaired, limited or restricted   Other PT Primary Discharge Status (G8992) At least 20 percent but less than 40 percent impaired, limited or restricted      Problem List Patient Active Problem List   Diagnosis Date Noted  . Bloating 08/12/2014  . Irritable bowel syndrome with diarrhea 08/12/2014  . Chest pain 03/30/2014  . Hypertension   . Back pain   . Extrinsic asthma 06/16/2012  . Fibroid   . Lichen sclerosus et atrophicus of the vulva   . Osteopenia   . Atrophic vaginitis   . CHRONIC RHINITIS 11/23/2009  . PULMONARY INFILTRATE INCLUDES (EOSINOPHILIA) 10/27/2009  . BRONCHITIS, ACUTE 10/23/2009  . HYPERLIPIDEMIA 12/22/2008  . Cough 12/22/2008  . HEMOPTYSIS 12/22/2008   PHYSICAL THERAPY DISCHARGE SUMMARY  Visits from Start of Care: 6  Current functional level related to goals / functional outcomes: See above for status and goal assessment.   Remaining deficits: Pt with mild pain in the lumbar spine at times.  Pt has a HEP in place to address core strength progression.   Education / Equipment: HEP, body mechanics education  Plan: Patient agrees to discharge.  Patient goals were met. Patient is being discharged due to meeting the stated rehab goals.  ?????     TAKACS,KELLY, PT 03/02/2015, 4:13 PM  [facility] [address], [address] [place], [place], [address] Phone: [phone]   Fax:  [phone]      

## 2015-03-16 DIAGNOSIS — H2513 Age-related nuclear cataract, bilateral: Secondary | ICD-10-CM | POA: Diagnosis not present

## 2015-03-17 DIAGNOSIS — J3089 Other allergic rhinitis: Secondary | ICD-10-CM | POA: Diagnosis not present

## 2015-03-22 ENCOUNTER — Ambulatory Visit (INDEPENDENT_AMBULATORY_CARE_PROVIDER_SITE_OTHER): Payer: Medicare Other | Admitting: Family Medicine

## 2015-03-22 VITALS — BP 110/82 | HR 83 | Temp 98.0°F | Resp 18 | Ht 64.0 in | Wt 112.0 lb

## 2015-03-22 DIAGNOSIS — J209 Acute bronchitis, unspecified: Secondary | ICD-10-CM

## 2015-03-22 DIAGNOSIS — J0101 Acute recurrent maxillary sinusitis: Secondary | ICD-10-CM | POA: Diagnosis not present

## 2015-03-22 MED ORDER — PREDNISONE 20 MG PO TABS: 40.0000 mg | ORAL_TABLET | Freq: Every day | ORAL | Status: DC

## 2015-03-22 MED ORDER — AMOXICILLIN-POT CLAVULANATE 875-125 MG PO TABS: 1.0000 | ORAL_TABLET | Freq: Two times a day (BID) | ORAL | Status: DC

## 2015-03-22 NOTE — Progress Notes (Signed)
° °  Subjective:  This chart was scribed for Robyn Haber, MD, Starleen Arms, Medical Scribe. This patient was seen in room Rm 12 and the patient's care was started at 9:36 AM.   Patient ID: Madison Kramer, female    DOB: 01-Dec-1946, 69 y.o.   MRN: [id number] Chief Complaint  Patient presents with   Cough   hurts to cough    HPI HPI Comments: Madison Kramer is a 69 y.o. female who presents to [facility] complaining of a dry cough onset 3 days ago with associated CP secondary to cough and wheezing.  She reports difficulty sleeping due to the cough.  Patient has used her albuterol inhaler without relief.  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  Patient reports an allergy to codeine.   Review of Systems  Respiratory: Positive for cough and wheezing.   Cardiovascular: Positive for chest pain.  Psychiatric/Behavioral: Positive for sleep disturbance.       Objective:   Physical Exam Patient appears tired and Haggard HEENT: Mild left TM bulging without erythema or fluid level Nasal passages are obstructed bilaterally with increased erythema Neck: Supple no adenopathy Chest: Few rhonchi left chest Oropharynx: Mild erythema posterior pharynx      Assessment & Plan:  9:41 AM Will prescribe 10 day course of amoxicillin and 3 days of twice daily prednisone. Patient acknowledges and agrees with plan.   This chart was scribed in my presence and reviewed by me personally.    ICD-9-CM ICD-10-CM   1. Acute bronchitis, unspecified organism 466.0 J20.9 amoxicillin-clavulanate (AUGMENTIN) 875-125 MG per tablet     predniSONE (DELTASONE) 20 MG tablet  2. Recurrent maxillary sinusitis, unspecified chronicity 461.0 J01.01 amoxicillin-clavulanate (AUGMENTIN) 875-125 MG per tablet     predniSONE (DELTASONE) 20 MG tablet     Signed, Robyn Haber, MD

## 2015-03-22 NOTE — Patient Instructions (Signed)

## 2015-03-30 ENCOUNTER — Ambulatory Visit: Payer: Medicare Other | Admitting: Cardiology

## 2015-04-07 ENCOUNTER — Encounter: Payer: Self-pay | Admitting: Gynecology

## 2015-04-07 ENCOUNTER — Ambulatory Visit (INDEPENDENT_AMBULATORY_CARE_PROVIDER_SITE_OTHER): Payer: Medicare Other | Admitting: Gynecology

## 2015-04-07 ENCOUNTER — Other Ambulatory Visit (HOSPITAL_COMMUNITY)
Admission: RE | Admit: 2015-04-07 | Discharge: 2015-04-07 | Disposition: A | Discharge status: Home / Self Care | Payer: Medicare Other | Source: Ambulatory Visit | Attending: Gynecology | Admitting: Gynecology

## 2015-04-07 ENCOUNTER — Other Ambulatory Visit: Payer: Self-pay | Admitting: Cardiology

## 2015-04-07 VITALS — BP 120/76 | Ht 64.0 in | Wt 112.0 lb

## 2015-04-07 DIAGNOSIS — Z124 Encounter for screening for malignant neoplasm of cervix: Secondary | ICD-10-CM | POA: Insufficient documentation

## 2015-04-07 DIAGNOSIS — M8580 Other specified disorders of bone density and structure, unspecified site: Secondary | ICD-10-CM | POA: Diagnosis not present

## 2015-04-07 DIAGNOSIS — Z01419 Encounter for gynecological examination (general) (routine) without abnormal findings: Secondary | ICD-10-CM

## 2015-04-07 DIAGNOSIS — N952 Postmenopausal atrophic vaginitis: Secondary | ICD-10-CM | POA: Diagnosis not present

## 2015-04-07 NOTE — Patient Instructions (Signed)
You may obtain a copy of any labs that were done today by logging onto MyChart as outlined in the instructions provided with your AVS (after visit summary). The office will not call with normal lab results but certainly if there are any significant abnormalities then we will contact you.   Health Maintenance, Female A healthy lifestyle and preventative care can promote health and wellness.  Maintain regular health, dental, and eye exams.  Eat a healthy diet. Foods like vegetables, fruits, whole grains, low-fat dairy products, and lean protein foods contain the nutrients you need without too many calories. Decrease your intake of foods high in solid fats, added sugars, and salt. Get information about a proper diet from your caregiver, if necessary.  Regular physical exercise is one of the most important things you can do for your health. Most adults should get at least 150 minutes of moderate-intensity exercise (any activity that increases your heart rate and causes you to sweat) each week. In addition, most adults need muscle-strengthening exercises on 2 or more days a week.   Maintain a healthy weight. The body mass index (BMI) is a screening tool to identify possible weight problems. It provides an estimate of body fat based on height and weight. Your caregiver can help determine your BMI, and can help you achieve or maintain a healthy weight. For adults 20 years and older:  A BMI below 18.5 is considered underweight.  A BMI of 18.5 to 24.9 is normal.  A BMI of 25 to 29.9 is considered overweight.  A BMI of 30 and above is considered obese.  Maintain normal blood lipids and cholesterol by exercising and minimizing your intake of saturated fat. Eat a balanced diet with plenty of fruits and vegetables. Blood tests for lipids and cholesterol should begin at age 61 and be repeated every 5 years. If your lipid or cholesterol levels are high, you are over 50, or you are a high risk for heart  disease, you may need your cholesterol levels checked more frequently.Ongoing high lipid and cholesterol levels should be treated with medicines if diet and exercise are not effective.  If you smoke, find out from your caregiver how to quit. If you do not use tobacco, do not start.  Lung cancer screening is recommended for adults aged 33 80 years who are at high risk for developing lung cancer because of a history of smoking. Yearly low-dose computed tomography (CT) is recommended for people who have at least a 30-pack-year history of smoking and are a current smoker or have quit within the past 15 years. A pack year of smoking is smoking an average of 1 pack of cigarettes a day for 1 year (for example: 1 pack a day for 30 years or 2 packs a day for 15 years). Yearly screening should continue until the smoker has stopped smoking for at least 15 years. Yearly screening should also be stopped for people who develop a health problem that would prevent them from having lung cancer treatment.  If you are pregnant, do not drink alcohol. If you are breastfeeding, be very cautious about drinking alcohol. If you are not pregnant and choose to drink alcohol, do not exceed 1 drink per day. One drink is considered to be 12 ounces (355 mL) of beer, 5 ounces (148 mL) of wine, or 1.5 ounces (44 mL) of liquor.  Avoid use of street drugs. Do not share needles with anyone. Ask for help if you need support or instructions about stopping  the use of drugs.  High blood pressure causes heart disease and increases the risk of stroke. Blood pressure should be checked at least every 1 to 2 years. Ongoing high blood pressure should be treated with medicines, if weight loss and exercise are not effective.  If you are 59 to 68 years old, ask your caregiver if you should take aspirin to prevent strokes.  Diabetes screening involves taking a blood sample to check your fasting blood sugar level. This should be done once every 3  years, after age 91, if you are within normal weight and without risk factors for diabetes. Testing should be considered at a younger age or be carried out more frequently if you are overweight and have at least 1 risk factor for diabetes.  Breast cancer screening is essential preventative care for women. You should practice "breast self-awareness." This means understanding the normal appearance and feel of your breasts and may include breast self-examination. Any changes detected, no matter how small, should be reported to a caregiver. Women in their 66s and 30s should have a clinical breast exam (CBE) by a caregiver as part of a regular health exam every 1 to 3 years. After age 101, women should have a CBE every year. Starting at age 100, women should consider having a mammogram (breast X-ray) every year. Women who have a family history of breast cancer should talk to their caregiver about genetic screening. Women at a high risk of breast cancer should talk to their caregiver about having an MRI and a mammogram every year.  Breast cancer gene (BRCA)-related cancer risk assessment is recommended for women who have family members with BRCA-related cancers. BRCA-related cancers include breast, ovarian, tubal, and peritoneal cancers. Having family members with these cancers may be associated with an increased risk for harmful changes (mutations) in the breast cancer genes BRCA1 and BRCA2. Results of the assessment will determine the need for genetic counseling and BRCA1 and BRCA2 testing.  The Pap test is a screening test for cervical cancer. Women should have a Pap test starting at age 57. Between ages 25 and 35, Pap tests should be repeated every 2 years. Beginning at age 37, you should have a Pap test every 3 years as long as the past 3 Pap tests have been normal. If you had a hysterectomy for a problem that was not cancer or a condition that could lead to cancer, then you no longer need Pap tests. If you are  between ages 50 and 76, and you have had normal Pap tests going back 10 years, you no longer need Pap tests. If you have had past treatment for cervical cancer or a condition that could lead to cancer, you need Pap tests and screening for cancer for at least 20 years after your treatment. If Pap tests have been discontinued, risk factors (such as a new sexual partner) need to be reassessed to determine if screening should be resumed. Some women have medical problems that increase the chance of getting cervical cancer. In these cases, your caregiver may recommend more frequent screening and Pap tests.  The human papillomavirus (HPV) test is an additional test that may be used for cervical cancer screening. The HPV test looks for the virus that can cause the cell changes on the cervix. The cells collected during the Pap test can be tested for HPV. The HPV test could be used to screen women aged 44 years and older, and should be used in women of any age  who have unclear Pap test results. After the age of 55, women should have HPV testing at the same frequency as a Pap test.  Colorectal cancer can be detected and often prevented. Most routine colorectal cancer screening begins at the age of 44 and continues through age 20. However, your caregiver may recommend screening at an earlier age if you have risk factors for colon cancer. On a yearly basis, your caregiver may provide home test kits to check for hidden blood in the stool. Use of a small camera at the end of a tube, to directly examine the colon (sigmoidoscopy or colonoscopy), can detect the earliest forms of colorectal cancer. Talk to your caregiver about this at age 86, when routine screening begins. Direct examination of the colon should be repeated every 5 to 10 years through age 13, unless early forms of pre-cancerous polyps or small growths are found.  Hepatitis C blood testing is recommended for all people born from [birth year] through 1964 and any  individual with known risks for hepatitis C.  Practice safe sex. Use condoms and avoid high-risk sexual practices to reduce the spread of sexually transmitted infections (STIs). Sexually active women aged 36 and younger should be checked for Chlamydia, which is a common sexually transmitted infection. Older women with new or multiple partners should also be tested for Chlamydia. Testing for other STIs is recommended if you are sexually active and at increased risk.  Osteoporosis is a disease in which the bones lose minerals and strength with aging. This can result in serious bone fractures. The risk of osteoporosis can be identified using a bone density scan. Women ages 20 and over and women at risk for fractures or osteoporosis should discuss screening with their caregivers. Ask your caregiver whether you should be taking a calcium supplement or vitamin D to reduce the rate of osteoporosis.  Menopause can be associated with physical symptoms and risks. Hormone replacement therapy is available to decrease symptoms and risks. You should talk to your caregiver about whether hormone replacement therapy is right for you.  Use sunscreen. Apply sunscreen liberally and repeatedly throughout the day. You should seek shade when your shadow is shorter than you. Protect yourself by wearing long sleeves, pants, a wide-brimmed hat, and sunglasses year round, whenever you are outdoors.  Notify your caregiver of new moles or changes in moles, especially if there is a change in shape or color. Also notify your caregiver if a mole is larger than the size of a pencil eraser.  Stay current with your immunizations. Document Released: 06/13/2011 Document Revised: 03/25/2013 Document Reviewed: 06/13/2011 [facility] Patient Information 2014 [employer].

## 2015-04-07 NOTE — Progress Notes (Addendum)
VERENIS NICOSIA Feb 06, 1946 [id number]        69 y.o.  G2P2002 for breast and pelvic exam.  Patient overall is doing well without complaints.  Past medical history,surgical history, problem list, medications, allergies, family history and social history were all reviewed and documented as reviewed in the EPIC chart.  ROS:  Performed with pertinent positives and negatives included in the history, assessment and plan.   Additional significant findings :  none   Exam: Kim Counsellor Vitals:   04/07/15 1505  BP: 120/76  Height: 5\' 4"  (1.626 m)  Weight: 112 lb (50.803 kg)   General appearance:  Normal affect, orientation and appearance. Skin: Grossly normal HEENT: Without gross lesions.  No cervical or supraclavicular adenopathy. Thyroid normal.  Lungs:  Clear without wheezing, rales or rhonchi Cardiac: RR, without RMG Abdominal:  Soft, nontender, without masses, guarding, rebound, organomegaly or hernia Breasts:  Examined lying and sitting without masses, retractions, discharge or axillary adenopathy.  Bilateral reduction scars Pelvic:  Ext/BUS/vagina with atrophic changes  Cervix with atrophic changes. Pap smear done  Uterus anteverted, normal size, shape and contour, midline and mobile nontender   Adnexa  Without masses or tenderness    Anus and perineum  Normal   Rectovaginal  Normal sphincter tone without palpated masses or tenderness.    Assessment/Plan:  69 y.o. [id number] female for breast and pelvic exam.   1. Postmenopausal/atrophic genital changes. Patient doing well without significant hot flushes, night sweats, vaginal dryness. No vaginal bleeding.  Continue to monitor. Patient knows to report any vaginal bleeding. 2. Pap smear 2012. Pap smear done today. No history of significant abnormal Pap smears. Options to stop screening altogether or less frequent screening intervals reviewed. Will readdress on annual basis. 3. Mammography 02/2015. Had a callback for probable lymph  node with 6 month follow up study planned. Patient knows importance of follow up. SBE monthly reviewed. Exam is normal without palpable abnormalities.  She does have a history of maternal aunt early-onset breast cancer with Jewish heritage. Genetic screening have been discussed and offered in the past and patient has declined. 4. Osteopenia.  DEXA 2012 T score -2.3 FRAX 10%/2.2%. Had been on Fosamax for approximately 5 years currently on a drug-free holiday. Was to repeat axial last year but never followed up for this. She agrees to arrange through Dr. Delene Ruffini office where she had her last bone density. Increased calcium and vitamin D. 5. Biopsy-proven lichen sclerosus 0488. She is without symptoms of itching or irritation. Exam does not show classic white changes. Patient will follow up if she develops any symptoms. Otherwise we'll plan on monitoring for now. 6. Colonoscopy 2008. Patient is going to check when they recommend to repeat this but at this point is planning on a 10 year interval. 7. Health maintenance. No routine lab work done as this is done at her primary physician's office and she plans a visit this coming fall. Follow up 1 year, sooner as needed.     [name] MD, 3:37 PM 04/07/2015

## 2015-04-09 LAB — CYTOLOGY - PAP

## 2015-04-10 DIAGNOSIS — J3089 Other allergic rhinitis: Secondary | ICD-10-CM | POA: Diagnosis not present

## 2015-04-23 ENCOUNTER — Ambulatory Visit: Payer: Medicare Other | Admitting: Nurse Practitioner

## 2015-05-08 ENCOUNTER — Telehealth: Payer: Self-pay | Admitting: Cardiology

## 2015-05-08 NOTE — Telephone Encounter (Signed)
Can add her on to my office schedule next week, just overbook.

## 2015-05-08 NOTE — Telephone Encounter (Signed)
Gave message to [facility] Dr. Claris Gladden scheduler. She will get in touch with patient and set up appointment.

## 2015-05-08 NOTE — Telephone Encounter (Signed)
New Message      Pt calling wanting to speak to [facility] about Dr. Claris Gladden schedule. Please call back and advise.

## 2015-05-08 NOTE — Telephone Encounter (Signed)
I spoke with the pt and she is frustrated because she cannot get an appointment with Dr Aundra Dubin. She states that every time she contacts our office for an appointment she cannot see an MD or our office changes her appointment.  The pt is currently scheduled to see Richardson Dopp PA-C 06/18/2015.  The pt states she will find another cardiologist if she can never see Dr Aundra Dubin. I made the pt aware that Webb Silversmith and Dr Aundra Dubin are not in the office today.  I will forward this message to Webb Silversmith and Dr Aundra Dubin to make them aware of the pt's frustration.

## 2015-05-12 ENCOUNTER — Encounter: Payer: Self-pay | Admitting: Cardiology

## 2015-05-12 ENCOUNTER — Ambulatory Visit (INDEPENDENT_AMBULATORY_CARE_PROVIDER_SITE_OTHER): Payer: Medicare Other | Admitting: Cardiology

## 2015-05-12 ENCOUNTER — Encounter: Payer: Self-pay | Admitting: *Deleted

## 2015-05-12 VITALS — BP 122/56 | HR 65 | Ht 64.0 in | Wt 114.0 lb

## 2015-05-12 DIAGNOSIS — I498 Other specified cardiac arrhythmias: Secondary | ICD-10-CM | POA: Diagnosis not present

## 2015-05-12 DIAGNOSIS — E785 Hyperlipidemia, unspecified: Secondary | ICD-10-CM

## 2015-05-12 DIAGNOSIS — I491 Atrial premature depolarization: Secondary | ICD-10-CM

## 2015-05-12 DIAGNOSIS — R0789 Other chest pain: Secondary | ICD-10-CM | POA: Diagnosis not present

## 2015-05-12 NOTE — Progress Notes (Signed)
Patient ID: Madison Kramer, female   DOB: 01/10/1946, 69 y.o.   MRN: [id number] PCP: Dr. Laurann Montana  68 yo with history of atypical chest pain and palpitations presents for cardiology followup.  She saw Dr. Rollene Fare in the past.  She had a Cardiolite in 3/11 that was probably normal.  She had an echo in 4/14 with no significant abnormalities and normal EF.  She has had minimal palpitations recently.  She works out daily (swimming, [employer], stationary bike).  No exertional dyspnea or chest pain.  She still works full time as a Forensic psychologist.  BP is under control on losartan.  Lipids in 7/15 were acceptable.   ECG: Abnormal P axis, suspect ectopic atrial rhythm with rate 65  Labs (8/14): LDL 95, HDL 71 Labs (10/14): K 4.3, creatinine 0.61 Labs (7/15): LDL 99, HDL 76  PMH: 1. GERD 2. HTN 3. Raynaud's phenomenon 4. Allergic rhinitis 5. H/o atypical chest pain: ETT Cardiolite 3/11 with 10 METS exercise, likely normal with breast attenuation.  Echo (4/14) with EF 55-60%, no significant abnormalities.   6. DDD/Low back pain. 7. Diverticulosis 8. Asthma: Followed by Dr. Melvyn Novas.  9. 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  Real estate agent.  Divorced, lives in [place].    FH: Mother with ?SCD.   ROS: All systems reviewed and negative except as per HPI  Current Outpatient Prescriptions  Medication Sig Dispense Refill  . atorvastatin (LIPITOR) 20 MG tablet TAKE (1) TABLET DAILY AT BEDTIME. 30 tablet 6  . Calcium Carbonate-Vitamin D (CALCIUM + D PO) Take 1 tablet by mouth daily.     . eszopiclone (LUNESTA) 1 MG TABS tablet Take 1 mg by mouth at bedtime as needed (for sleep). Take immediately before bedtime    . losartan (COZAAR) 50 MG tablet TAKE (1) TABLET DAILY. 90 tablet 0  . Magnesium 250 MG TABS Take 250 mg by mouth daily.    . Multiple Vitamin (MULTIVITAMIN) capsule Take 1 capsule by mouth daily.      . Probiotic Product (PROBIOTIC DAILY PO) Take 1 tablet  by mouth daily.     No current facility-administered medications for this visit.   BP 122/56 mmHg  Pulse 65  Ht 5\' 4"  (1.626 m)  Wt 114 lb (51.71 kg)  BMI 19.56 kg/m2 General: NAD Neck: No JVD, no thyromegaly or thyroid nodule.  Lungs: Clear to auscultation bilaterally with normal respiratory effort. CV: Nondisplaced PMI.  Heart regular S1/S2, no S3/S4, no murmur.  No peripheral edema.  No carotid bruit.  Normal pedal pulses.  Abdomen: Soft, nontender, no hepatosplenomegaly, no distention.  Skin: Intact without lesions or rashes.  Neurologic: Alert and oriented x 3.  Psych: Normal affect. Extremities: No clubbing or cyanosis.  HEENT: Normal.   Assessment/Plan: 1. Chest pain: History of atypical chest pain.  No recent symptoms.  Negative Cardiolite in 3/11.   2. Hyperlipidemia: Good lipids in 7/15.  I will arrange for Lipomed profile.  3. HTN: BP is controlled on current regimen.  4. Palpitations: Minimal.  5. Ectopic atrial rhythm: HR in 60s.  Last year's ECG was in NSR.  I do not think this is causing her any problems at this time, will simply monitor.   Followup in 1 year.   [name] 05/12/2015

## 2015-05-12 NOTE — Patient Instructions (Signed)
Medication Instructions:  No changes today.  Labwork Your physician recommends that you return for a FASTING NMR profile.   Testing/Procedures: None today.  Follow-Up: Your physician wants you to follow-up in: 1 year with Dr Aundra Dubin. (May 2017).  You will receive a reminder letter in the mail two months in advance. If you don't receive a letter, please call our office to schedule the follow-up appointment.

## 2015-05-13 ENCOUNTER — Other Ambulatory Visit: Payer: Self-pay | Admitting: Cardiology

## 2015-05-13 ENCOUNTER — Other Ambulatory Visit (INDEPENDENT_AMBULATORY_CARE_PROVIDER_SITE_OTHER): Payer: Medicare Other | Admitting: *Deleted

## 2015-05-13 DIAGNOSIS — E785 Hyperlipidemia, unspecified: Secondary | ICD-10-CM | POA: Diagnosis not present

## 2015-05-15 DIAGNOSIS — J3089 Other allergic rhinitis: Secondary | ICD-10-CM | POA: Diagnosis not present

## 2015-05-15 DIAGNOSIS — J301 Allergic rhinitis due to pollen: Secondary | ICD-10-CM | POA: Diagnosis not present

## 2015-05-15 LAB — NMR LIPOPROFILE WITH LIPIDS
Cholesterol, Total: 189 mg/dL (ref 100–199)
HDL Particle Number: 48.7 umol/L (ref >=30.5)
HDL SIZE: 9.4 nm (ref >=9.2)
HDL-C: 77 mg/dL (ref >39)
LARGE HDL: 10.6 umol/L (ref >=4.8)
LDL (calc): 89 mg/dL (ref 0–99)
LDL PARTICLE NUMBER: 1343 nmol/L — AB (ref <1000)
LDL SIZE: 20.8 nm (ref >=20.8)
LP-IR Score: 53 — ABNORMAL HIGH (ref <=45)
Large VLDL-P: 4.3 nmol/L — ABNORMAL HIGH (ref <=2.7)
Small LDL Particle Number: 530 nmol/L — ABNORMAL HIGH (ref <=527)
Triglycerides: 113 mg/dL (ref 0–149)
VLDL Size: 55.4 nm — ABNORMAL HIGH (ref <=46.6)

## 2015-05-18 ENCOUNTER — Other Ambulatory Visit: Payer: Self-pay | Admitting: *Deleted

## 2015-05-18 DIAGNOSIS — E785 Hyperlipidemia, unspecified: Secondary | ICD-10-CM

## 2015-05-18 MED ORDER — ATORVASTATIN CALCIUM 40 MG PO TABS: 40.0000 mg | ORAL_TABLET | Freq: Every day | ORAL | Status: DC

## 2015-05-25 DIAGNOSIS — K130 Diseases of lips: Secondary | ICD-10-CM | POA: Diagnosis not present

## 2015-05-25 DIAGNOSIS — L209 Atopic dermatitis, unspecified: Secondary | ICD-10-CM | POA: Diagnosis not present

## 2015-06-02 ENCOUNTER — Other Ambulatory Visit: Payer: Self-pay

## 2015-06-02 MED ORDER — ATORVASTATIN CALCIUM 40 MG PO TABS: 40.0000 mg | ORAL_TABLET | Freq: Every day | ORAL | Status: DC

## 2015-06-18 ENCOUNTER — Ambulatory Visit: Payer: Medicare Other | Admitting: Physician Assistant

## 2015-06-29 DIAGNOSIS — J3089 Other allergic rhinitis: Secondary | ICD-10-CM | POA: Diagnosis not present

## 2015-07-03 DIAGNOSIS — L03012 Cellulitis of left finger: Secondary | ICD-10-CM | POA: Diagnosis not present

## 2015-07-03 DIAGNOSIS — H9202 Otalgia, left ear: Secondary | ICD-10-CM | POA: Diagnosis not present

## 2015-07-08 ENCOUNTER — Encounter: Payer: Self-pay | Admitting: Cardiovascular Disease

## 2015-07-08 DIAGNOSIS — L923 Foreign body granuloma of the skin and subcutaneous tissue: Secondary | ICD-10-CM | POA: Diagnosis not present

## 2015-07-08 DIAGNOSIS — L239 Allergic contact dermatitis, unspecified cause: Secondary | ICD-10-CM | POA: Diagnosis not present

## 2015-07-09 ENCOUNTER — Ambulatory Visit (INDEPENDENT_AMBULATORY_CARE_PROVIDER_SITE_OTHER): Payer: Medicare Other | Admitting: Family Medicine

## 2015-07-09 VITALS — BP 110/80 | HR 72 | Temp 97.9°F | Resp 14 | Ht 64.0 in | Wt 114.0 lb

## 2015-07-09 DIAGNOSIS — M542 Cervicalgia: Secondary | ICD-10-CM

## 2015-07-09 DIAGNOSIS — S161XXA Strain of muscle, fascia and tendon at neck level, initial encounter: Secondary | ICD-10-CM

## 2015-07-09 MED ORDER — CYCLOBENZAPRINE HCL 10 MG PO TABS: 10.0000 mg | ORAL_TABLET | Freq: Three times a day (TID) | ORAL | Status: DC

## 2015-07-09 NOTE — Progress Notes (Signed)
[facility] [address], [place] [place] [address] [phone]  Date:  07/09/2015   Name:  Madison Kramer   DOB:  1946-07-13   MRN:  [id number]  PCP:  Irven Shelling, MD    Chief Complaint: Neck Pain and Shoulder Pain   History of Present Illness:  Madison Kramer is a 69 y.o. very pleasant female patient who presents with the following:  She is here today with an MSK concern. She plans to go out of town this weekend- otherwise states that she would not have come in so early for this issue.  Today is Wednesday.   She did not sleep well Monday evening- could not get comfortable. Tuesday am she had pain and stiffness in her neck, pain with moving her neck.  Same sx yesterday and she had a hard time getting comfortable at night She has tried heat and icy-hot She is not aware of any injury, thinks that she just slept wrong No pain into her arm, but does have pain in her right trapezius She has known history of some spine arthritis No numbness or tingling in her hand or arm, no pain in her throat or with swallowing  She did have an L4/5 discectomy in 2014  Patient Active Problem List   Diagnosis Date Noted  . 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 Bloating 08/12/2014  . Irritable bowel syndrome with diarrhea 08/12/2014  . Chest pain 03/30/2014  . Hypertension   . Back pain   . Extrinsic asthma 06/16/2012  . Lichen sclerosus et atrophicus of the vulva   . Osteopenia   . Atrophic vaginitis   . CHRONIC RHINITIS 11/23/2009  . PULMONARY INFILTRATE INCLUDES (EOSINOPHILIA) 10/27/2009  . BRONCHITIS, ACUTE 10/23/2009  . HYPERLIPIDEMIA 12/22/2008  . Cough 12/22/2008  . HEMOPTYSIS 12/22/2008    Past Medical History  Diagnosis Date  . Lichen sclerosus et atrophicus of the vulva 2008    Biopsy-proven  . Osteopenia 11/2011    T score -2.3 FRAX 10.7%/2.2%  . Elevated cholesterol   . Hypertension   . Back pain   . Asthma   . Allergic rhinitis   . Lactose  intolerance   . GERD (gastroesophageal reflux disease)   . Arthritis   . Cataract     Hx; of  . Anxiety   . Pneumonia   . Raynaud's syndrome     "possible"  . Hemoptysis 2007    From reflux pharyngitis  . Insomnia   . Nocturia   . Atypical chest pain     Possibly from DES  . Diverticulosis     Left    Past Surgical History  Procedure Laterality Date  . Breast surgery  2001    REDUCTION MAMMAPLASTY  . Tubal ligation  1987  . Inguinal hernia repair  1971    BILATERAL  . Hysteroscopy  2003    HYSTEROSCOPIC RESECTION OF MYOMA  . Foot surgery  2009  . Tonsillectomy    . Mandible surgery    . Myomectomy    . Colonoscopy    . Lumbar laminectomy/decompression microdiscectomy Right 08/30/2013    Procedure: Right Lumbar four-five Extraforaminal Diskectomy;  Surgeon: Hosie Spangle, MD;  Location: [facility];  Service: Neurosurgery;  Laterality: Right;    History  Substance Use Topics  . Smoking status: Former Smoker -- 20 years    Quit date: 12/12/1988  . Smokeless tobacco: Never Used     Comment: "random smoker"  .  Alcohol Use: 4.2 oz/week    7 Standard drinks or equivalent per week     Comment:      Family History  Problem Relation Age of Onset  . Heart disease Mother   . Hypertension Mother   . Diabetes Mother   . Multiple myeloma Father   . Breast cancer Maternal Aunt     Age 75's  . Heart disease Maternal Uncle   . Heart disease Paternal Grandfather     Allergies  Allergen Reactions  . Codeine Nausea Only  . Erythromycin Other (See Comments)    Stomach cramping  . Sulfa Antibiotics     As a child  . Sulfonamide Derivatives   . Tetracyclines & Related     unknown  . Tramadol Nausea Only and Other (See Comments)    Dizziness and mind racing    Medication list has been reviewed and updated.  Current Outpatient Prescriptions on File Prior to Visit  Medication Sig Dispense Refill  . atorvastatin (LIPITOR) 40 MG tablet Take 1 tablet (40 mg total)  by mouth daily at 6 PM. 30 tablet 11  . Calcium Carbonate-Vitamin D (CALCIUM + D PO) Take 1 tablet by mouth daily.     . eszopiclone (LUNESTA) 1 MG TABS tablet Take 1 mg by mouth at bedtime as needed (for sleep). Take immediately before bedtime    . losartan (COZAAR) 50 MG tablet TAKE (1) TABLET DAILY. 90 tablet 0  . Magnesium 250 MG TABS Take 250 mg by mouth daily.    . Multiple Vitamin (MULTIVITAMIN) capsule Take 1 capsule by mouth daily.      . Probiotic Product (PROBIOTIC DAILY PO) Take 1 tablet by mouth daily.     No current facility-administered medications on file prior to visit.    Review of Systems:  As per HPI- otherwise negative. She is on trazodone 25 mg at bedtime which she takes just some days  Physical Examination: Filed Vitals:   07/09/15 1136  BP: 96/52  Pulse: 72  Temp: 97.9 F (36.6 C)  Resp: 14   Filed Vitals:   07/09/15 1136  Height: _0  (1.626 m)  Weight: 114 lb (51.71 kg)   Body mass index is 19.56 kg/(m^2). Ideal Body Weight: Weight in (lb) to have BMI = 25: 145.3  GEN: WDWN, NAD, Non-toxic, A & O x 3, slim build, looks well HEENT: Atraumatic, Normocephalic. Neck supple. No masses, No LAD.  Bilateral TM wnl, oropharynx normal.  PEERL,EOMI.   She has mild tenderness of the su[perior right trapezius muscle.  Also tenderness in the muscles of the right side of the neck.  Normal ROM of the neck except for reduced rotation to the right due to pain Normal strength of BUE No rash or lesion on the skin Ears and Nose: No external deformity. CV: RRR, No M/G/R. No JVD. No thrill. No extra heart sounds. PULM: CTA B, no wheezes, crackles, rhonchi. No retractions. No resp. distress. No accessory muscle use. EXTR: No c/c/e NEURO Normal gait.  PSYCH: Normally interactive. Conversant. Not depressed or anxious appearing.  Calm demeanor.    Assessment and Plan: Neck strain, initial encounter - Plan: cyclobenzaprine (FLEXERIL) 10 MG tablet  Neck pain - Plan:  cyclobenzaprine (FLEXERIL) 10 MG tablet  Placed in a soft cervical collar to use as needed for comfort Flexeril rx as needed- she will avoid using this with her trazodone if at all possible Follow-up if not better soon- ,Sooner if worse.  Signed Lamar Blinks, MD

## 2015-07-09 NOTE — Patient Instructions (Addendum)
Use the flexeril as needed- this may make you sleepy so do not use it when you are driving Use as neck collar as needed for comfort- you should be able to find one at [facility] or a drug store.   Use this just as needed Let me know if you do not feel better soon!

## 2015-07-24 ENCOUNTER — Other Ambulatory Visit: Payer: Self-pay | Admitting: Cardiology

## 2015-07-30 DIAGNOSIS — J3089 Other allergic rhinitis: Secondary | ICD-10-CM | POA: Diagnosis not present

## 2015-08-12 DIAGNOSIS — L821 Other seborrheic keratosis: Secondary | ICD-10-CM | POA: Diagnosis not present

## 2015-08-12 DIAGNOSIS — L218 Other seborrheic dermatitis: Secondary | ICD-10-CM | POA: Diagnosis not present

## 2015-08-13 DIAGNOSIS — M4316 Spondylolisthesis, lumbar region: Secondary | ICD-10-CM | POA: Diagnosis not present

## 2015-08-13 DIAGNOSIS — M546 Pain in thoracic spine: Secondary | ICD-10-CM | POA: Diagnosis not present

## 2015-08-13 DIAGNOSIS — M5136 Other intervertebral disc degeneration, lumbar region: Secondary | ICD-10-CM | POA: Diagnosis not present

## 2015-08-13 DIAGNOSIS — M4726 Other spondylosis with radiculopathy, lumbar region: Secondary | ICD-10-CM | POA: Diagnosis not present

## 2015-08-13 DIAGNOSIS — I10 Essential (primary) hypertension: Secondary | ICD-10-CM | POA: Diagnosis not present

## 2015-08-13 DIAGNOSIS — M5416 Radiculopathy, lumbar region: Secondary | ICD-10-CM | POA: Diagnosis not present

## 2015-08-14 DIAGNOSIS — F4322 Adjustment disorder with anxiety: Secondary | ICD-10-CM | POA: Diagnosis not present

## 2015-08-18 ENCOUNTER — Other Ambulatory Visit (INDEPENDENT_AMBULATORY_CARE_PROVIDER_SITE_OTHER): Payer: Medicare Other | Admitting: *Deleted

## 2015-08-18 DIAGNOSIS — E785 Hyperlipidemia, unspecified: Secondary | ICD-10-CM

## 2015-08-18 NOTE — Addendum Note (Signed)
Addended by: Eulis Foster on: 08/18/2015 09:46 AM   Modules accepted: Orders

## 2015-08-20 ENCOUNTER — Telehealth: Payer: Self-pay | Admitting: Cardiology

## 2015-08-20 LAB — NMR LIPOPROFILE WITH LIPIDS
Cholesterol, Total: 158 mg/dL (ref 100–199)
HDL PARTICLE NUMBER: 42.5 umol/L (ref >=30.5)
HDL SIZE: 9.7 nm (ref >=9.2)
HDL-C: 74 mg/dL (ref >39)
LARGE HDL: 10.3 umol/L (ref >=4.8)
LDL (calc): 62 mg/dL (ref 0–99)
LDL Particle Number: 873 nmol/L (ref <1000)
LDL Size: 20.4 nm (ref >=20.8)
LP-IR Score: 31 (ref <=45)
Large VLDL-P: 1.7 nmol/L (ref <=2.7)
Small LDL Particle Number: 505 nmol/L (ref <=527)
Triglycerides: 110 mg/dL (ref 0–149)
VLDL Size: 46.7 nm — ABNORMAL HIGH (ref <=46.6)

## 2015-08-20 NOTE — Telephone Encounter (Signed)
New message      Pt is calling to get lab results

## 2015-08-20 NOTE — Telephone Encounter (Signed)
Informed patient that there is not a result note at this time, so I cannot give her the doctor's advise about her results. Patient stated she is hoping she will be able to cut down on her Lipitor. Will forward to Dr. Aundra Dubin for his advisement.

## 2015-08-20 NOTE — Telephone Encounter (Signed)
Patient is aware of lab results.

## 2015-08-21 DIAGNOSIS — F4322 Adjustment disorder with anxiety: Secondary | ICD-10-CM | POA: Diagnosis not present

## 2015-08-26 DIAGNOSIS — N63 Unspecified lump in breast: Secondary | ICD-10-CM | POA: Diagnosis not present

## 2015-08-26 DIAGNOSIS — Z09 Encounter for follow-up examination after completed treatment for conditions other than malignant neoplasm: Secondary | ICD-10-CM | POA: Diagnosis not present

## 2015-08-28 ENCOUNTER — Encounter: Payer: Self-pay | Admitting: Gynecology

## 2015-08-31 DIAGNOSIS — F4322 Adjustment disorder with anxiety: Secondary | ICD-10-CM | POA: Diagnosis not present

## 2015-09-01 DIAGNOSIS — X32XXXA Exposure to sunlight, initial encounter: Secondary | ICD-10-CM | POA: Diagnosis not present

## 2015-09-01 DIAGNOSIS — L259 Unspecified contact dermatitis, unspecified cause: Secondary | ICD-10-CM | POA: Diagnosis not present

## 2015-09-01 DIAGNOSIS — L570 Actinic keratosis: Secondary | ICD-10-CM | POA: Diagnosis not present

## 2015-09-01 DIAGNOSIS — L219 Seborrheic dermatitis, unspecified: Secondary | ICD-10-CM | POA: Diagnosis not present

## 2015-09-02 DIAGNOSIS — J3089 Other allergic rhinitis: Secondary | ICD-10-CM | POA: Diagnosis not present

## 2015-09-09 DIAGNOSIS — J4530 Mild persistent asthma, uncomplicated: Secondary | ICD-10-CM | POA: Diagnosis not present

## 2015-09-09 DIAGNOSIS — J3089 Other allergic rhinitis: Secondary | ICD-10-CM | POA: Diagnosis not present

## 2015-09-09 DIAGNOSIS — K219 Gastro-esophageal reflux disease without esophagitis: Secondary | ICD-10-CM | POA: Diagnosis not present

## 2015-09-09 DIAGNOSIS — H1045 Other chronic allergic conjunctivitis: Secondary | ICD-10-CM | POA: Diagnosis not present

## 2015-09-09 DIAGNOSIS — F4322 Adjustment disorder with anxiety: Secondary | ICD-10-CM | POA: Diagnosis not present

## 2015-09-22 ENCOUNTER — Telehealth: Payer: Self-pay | Admitting: Internal Medicine

## 2015-09-22 NOTE — Telephone Encounter (Signed)
I have not seen her in > 2 yrs. Best if she come in to be seen regarding possible prescription drugs

## 2015-09-22 NOTE — Telephone Encounter (Signed)
Pt states she is having a terrible time with her IBS. States she is very bloated and is having problems with constipation. Discussed with pt that she can take gas-x otc for the bloating. Pt has some miralax she can try for the constipation but she wants to know if there is something else that could be prescribed for her. Please advise.

## 2015-09-23 NOTE — Telephone Encounter (Signed)
Pt aware.

## 2015-09-28 DIAGNOSIS — F4322 Adjustment disorder with anxiety: Secondary | ICD-10-CM | POA: Diagnosis not present

## 2015-10-08 DIAGNOSIS — M545 Low back pain: Secondary | ICD-10-CM | POA: Diagnosis not present

## 2015-10-08 DIAGNOSIS — J3089 Other allergic rhinitis: Secondary | ICD-10-CM | POA: Diagnosis not present

## 2015-10-08 DIAGNOSIS — M47817 Spondylosis without myelopathy or radiculopathy, lumbosacral region: Secondary | ICD-10-CM | POA: Diagnosis not present

## 2015-10-13 DIAGNOSIS — E7800 Pure hypercholesterolemia, unspecified: Secondary | ICD-10-CM | POA: Diagnosis not present

## 2015-10-13 DIAGNOSIS — I10 Essential (primary) hypertension: Secondary | ICD-10-CM | POA: Diagnosis not present

## 2015-10-13 DIAGNOSIS — Z0000 Encounter for general adult medical examination without abnormal findings: Secondary | ICD-10-CM | POA: Diagnosis not present

## 2015-10-13 DIAGNOSIS — Z1389 Encounter for screening for other disorder: Secondary | ICD-10-CM | POA: Diagnosis not present

## 2015-10-13 DIAGNOSIS — F439 Reaction to severe stress, unspecified: Secondary | ICD-10-CM | POA: Diagnosis not present

## 2015-10-13 DIAGNOSIS — Z23 Encounter for immunization: Secondary | ICD-10-CM | POA: Diagnosis not present

## 2015-10-13 DIAGNOSIS — M810 Age-related osteoporosis without current pathological fracture: Secondary | ICD-10-CM

## 2015-10-13 DIAGNOSIS — M8588 Other specified disorders of bone density and structure, other site: Secondary | ICD-10-CM | POA: Diagnosis not present

## 2015-10-13 HISTORY — DX: Age-related osteoporosis without current pathological fracture: M81.0

## 2015-11-02 DIAGNOSIS — F4322 Adjustment disorder with anxiety: Secondary | ICD-10-CM | POA: Diagnosis not present

## 2015-11-11 DIAGNOSIS — M859 Disorder of bone density and structure, unspecified: Secondary | ICD-10-CM | POA: Diagnosis not present

## 2015-11-11 DIAGNOSIS — M810 Age-related osteoporosis without current pathological fracture: Secondary | ICD-10-CM | POA: Diagnosis not present

## 2015-11-12 DIAGNOSIS — J3089 Other allergic rhinitis: Secondary | ICD-10-CM | POA: Diagnosis not present

## 2015-11-16 DIAGNOSIS — F4322 Adjustment disorder with anxiety: Secondary | ICD-10-CM | POA: Diagnosis not present

## 2015-11-19 ENCOUNTER — Other Ambulatory Visit (INDEPENDENT_AMBULATORY_CARE_PROVIDER_SITE_OTHER): Payer: Medicare Other

## 2015-11-19 ENCOUNTER — Ambulatory Visit (INDEPENDENT_AMBULATORY_CARE_PROVIDER_SITE_OTHER): Payer: Medicare Other | Admitting: Internal Medicine

## 2015-11-19 ENCOUNTER — Encounter: Payer: Self-pay | Admitting: Internal Medicine

## 2015-11-19 VITALS — BP 100/70 | HR 68 | Ht 64.0 in

## 2015-11-19 DIAGNOSIS — R140 Abdominal distension (gaseous): Secondary | ICD-10-CM | POA: Diagnosis not present

## 2015-11-19 DIAGNOSIS — K219 Gastro-esophageal reflux disease without esophagitis: Secondary | ICD-10-CM | POA: Diagnosis not present

## 2015-11-19 DIAGNOSIS — F411 Generalized anxiety disorder: Secondary | ICD-10-CM

## 2015-11-19 DIAGNOSIS — K589 Irritable bowel syndrome without diarrhea: Secondary | ICD-10-CM

## 2015-11-19 LAB — IGA: IgA: 240 mg/dL (ref 68–378)

## 2015-11-19 NOTE — Progress Notes (Signed)
HISTORY OF PRESENT ILLNESS:  Madison Kramer is a 70 y.o. female with hypertension, hyperlipidemia, back pain, history of GERD, and chronic bloating/IBS. She presents to stay with a chief complaint of "worsening IBS" and bloating. I last saw the patient 05/08/2013 for chronic pharyngeal complaints felt likely functional. See that dictation. She was last seen in this office by the GI physician assistant 08/12/2014 for abdominal bloating with intermittent diarrhea. Xifaxan was prescribed but not taken due to cost. Patient's current history began approximately 2 months ago when she developed intermittent bloating abdominal discomfort. This was associated with significant recent stress (family issues). She initially described constipated bowels for which she was taking MiraLAX. She does report occasional diarrheal stools which will last 1 day. Bloating discomfort seems to be prominent for about one week and then absent for about one week. She has been on regular probiotic (can specified type). No longer taking MiraLAX. She continues to exercise regularly and her weight is stable. Last colonoscopy 2008 (elsewhere) was normal except for left-sided diverticulosis. She did have upper endoscopy here December 2013 for pharyngeal complaints. This was normal. There has been no true abdominal pain or rectal bleeding. She feels the current symptoms are similar to those she has had intermittently in the past. She does notice the bloating seems to be worse toward days and and often exacerbated by meals. Patient denies recent problems with GERD. Not requiring PPI. She attributes dietary change to this improvement. She has multiple multiple questions.  REVIEW OF SYSTEMS:  All non-GI ROS negative except for back pain  Past Medical History  Diagnosis Date  . Lichen sclerosus et atrophicus of the vulva 2008    Biopsy-proven  . Osteopenia 11/2011    T score -2.3 FRAX 10.7%/2.2%  . Elevated cholesterol   . Hypertension   .  Back pain   . Asthma   . Allergic rhinitis   . Lactose intolerance   . GERD (gastroesophageal reflux disease)   . Arthritis   . Cataract     Hx; of  . Anxiety   . Pneumonia   . Raynaud's syndrome     "possible"  . Hemoptysis 2007    From reflux pharyngitis  . Insomnia   . Nocturia   . Atypical chest pain     Possibly from DES  . Diverticulosis     Left    Past Surgical History  Procedure Laterality Date  . Breast surgery  2001    REDUCTION MAMMAPLASTY  . Tubal ligation  1987  . Inguinal hernia repair  1971    BILATERAL  . Hysteroscopy  2003    HYSTEROSCOPIC RESECTION OF MYOMA  . Foot surgery  2009  . Tonsillectomy    . Mandible surgery    . Myomectomy    . Colonoscopy    . Lumbar laminectomy/decompression microdiscectomy Right 08/30/2013    Procedure: Right Lumbar four-five Extraforaminal Diskectomy;  Surgeon: Hosie Spangle, MD;  Location: [facility];  Service: Neurosurgery;  Laterality: Right;    Social History ANUM PALECEK  reports that she quit smoking about 26 years ago. She has never used smokeless tobacco. She reports that she drinks about 4.2 oz of alcohol per week. She reports that she does not use illicit drugs.  family history includes Breast cancer in her maternal aunt; Diabetes in her mother; Heart disease in her maternal uncle, mother, and paternal grandfather; Hypertension in her mother; Multiple myeloma in her father.  Allergies  Allergen Reactions  .  Codeine Nausea Only  . Erythromycin Other (See Comments)    Stomach cramping  . Sulfa Antibiotics     As a child  . Sulfonamide Derivatives   . Tetracyclines & Related     unknown  . Tramadol Nausea Only and Other (See Comments)    Dizziness and mind racing       PHYSICAL EXAMINATION: Vital signs: BP 100/70 mmHg  Pulse 68  Ht 5' 4" (1.626 m)  Wt   Constitutional: generally well-appearing, no acute distress Psychiatric: alert and oriented x3, cooperative Eyes: extraocular movements  intact, anicteric, conjunctiva pink Mouth: oral pharynx moist, no lesions Neck: supple without thyromegaly Lymph: no lymphadenopathy Cardiovascular: heart regular rate and rhythm, no murmur Lungs: clear to auscultation bilaterally Abdomen: soft, nontender, nondistended, no obvious ascites, no peritoneal signs, normal bowel sounds, no organomegaly Rectal: Omitted Extremities: no clubbing cyanosis or lower extremity edema bilaterally Skin: no lesions on visible extremities Neuro: No focal deficits. Normal deep tendon reflexes.   ASSESSMENT:  #1. Alternating bowel habits and bloating. Most consistent with IBS. Exacerbated recently. No alarm features #2. History of GERD. No active symptoms. Not requiring PPI #3. Diverticulosis on screening colonoscopy 2008 with Dr. Sammuel Cooper #4. Health related anxiety   PLAN:  #1. Long discussion on IBS #2. Check tissue transglutaminase antibody and serum IgA to screen for celiac disease. We will call her with the results. Also discussed with her consideration of gluten-free trial in non-celiac patients with IBS. Reviewed literature #3. Continue probiotic if helpful #4. Symptomatic treatment for constipation and diarrhea with over-the-counter agents as needed #5. Literature on intestinal gas provided for her review #6. Literature on IBS provided for her review #7. Anti-gas and flatulence dietary brochure provided for her review #8. Consider empiric trial of metronidazole 250 mg 3 times a day 1 week, for possible bacterial overgrowth. Patient wants to hold off at this point (as I told her she would need to avoid alcohol while being on the medication). However she will contact the office if she wishes to try this. #9. Continue exercise #10. Work with PCP regarding stress issues as this may be a contributor to symptoms #11. GI follow-up as needed #12. Surveillance colonoscopy 2018. Patient aware  40 minutes was spent face-to-face with the patient. Greater  than 50% of the time use for counseling regarding her GI symptoms and plan as outlined. As well answering multiple questions

## 2015-11-19 NOTE — Patient Instructions (Signed)
Your physician has requested that you go to the basement for the following lab work before leaving today:  TTG, IGA  Please follow up with Dr. Henrene Pastor as needed

## 2015-11-20 LAB — TISSUE TRANSGLUTAMINASE, IGA: Tissue Transglutaminase Ab, IgA: 1 U/mL (ref <4)

## 2015-12-17 DIAGNOSIS — J301 Allergic rhinitis due to pollen: Secondary | ICD-10-CM | POA: Diagnosis not present

## 2015-12-17 DIAGNOSIS — J3089 Other allergic rhinitis: Secondary | ICD-10-CM | POA: Diagnosis not present

## 2016-01-07 DIAGNOSIS — L601 Onycholysis: Secondary | ICD-10-CM | POA: Diagnosis not present

## 2016-01-11 ENCOUNTER — Ambulatory Visit (INDEPENDENT_AMBULATORY_CARE_PROVIDER_SITE_OTHER): Payer: Medicare Other | Admitting: Family Medicine

## 2016-01-11 VITALS — BP 126/74 | HR 56 | Temp 98.1°F | Resp 18 | Ht 64.0 in | Wt 112.0 lb

## 2016-01-11 DIAGNOSIS — M545 Low back pain, unspecified: Secondary | ICD-10-CM

## 2016-01-11 NOTE — Progress Notes (Signed)
[facility] [address] [place] [address] [phone]  Date:  01/11/2016   Name:  Madison Kramer   DOB:  Jun 03, 1946   MRN:  [id number]  PCP:  Irven Shelling, MD    Chief Complaint: Back Pain   History of Present Illness:  Madison Kramer is a 70 y.o. very pleasant female patient who presents with the following:  Last seen by myself in July with neck stiffness and pain History of L4/5 discectomy in 2014.  Nothing in controlled substance database.    Here today with an exacerbation of her lower back sx She is traveling to [place] next week and is concerned that she will not be comfortable  She did stand up a lot at a rally about a week ago- generally standing for long periods is not helpful to her back. Then she drove to [place] and back over the weekend.  This has further increased her left lower back pain She is a pt of Dr. Rita Ohara does not feel that there is much that can be done via NSG right now. She does plan to see her spine orthopedist soon- Dr. Ron Agee at [facility].    She would like to see Dr. Belia Heman for PT also- needs referral  She notes pain in the left lower back- this runs "from sore to throbbing."  She has not noted any numbness or weakness of the left leg.  No bowel or bladder incont She is taking some ibuprofen. She is concerned about how much is safe for her to use  Patient Active Problem List   Diagnosis Date Noted  . 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 Bloating 08/12/2014  . Irritable bowel syndrome with diarrhea 08/12/2014  . Chest pain 03/30/2014  . Hypertension   . Back pain   . Extrinsic asthma 06/16/2012  . Lichen sclerosus et atrophicus of the vulva   . Osteopenia   . Atrophic vaginitis   . CHRONIC RHINITIS 11/23/2009  . PULMONARY INFILTRATE INCLUDES (EOSINOPHILIA) 10/27/2009  . BRONCHITIS, ACUTE 10/23/2009  . HYPERLIPIDEMIA 12/22/2008  . Cough 12/22/2008  . HEMOPTYSIS 12/22/2008    Past Medical History   Diagnosis Date  . Lichen sclerosus et atrophicus of the vulva 2008    Biopsy-proven  . Osteopenia 11/2011    T score -2.3 FRAX 10.7%/2.2%  . Elevated cholesterol   . Hypertension   . Back pain   . Asthma   . Allergic rhinitis   . Lactose intolerance   . GERD (gastroesophageal reflux disease)   . Arthritis   . Cataract     Hx; of  . Anxiety   . Pneumonia   . Raynaud's syndrome     "possible"  . Hemoptysis 2007    From reflux pharyngitis  . Insomnia   . Nocturia   . Atypical chest pain     Possibly from DES  . Diverticulosis     Left    Past Surgical History  Procedure Laterality Date  . Breast surgery  2001    REDUCTION MAMMAPLASTY  . Tubal ligation  1987  . Inguinal hernia repair  1971    BILATERAL  . Hysteroscopy  2003    HYSTEROSCOPIC RESECTION OF MYOMA  . Foot surgery  2009  . Tonsillectomy    . Mandible surgery    . Myomectomy    . Colonoscopy    . Lumbar laminectomy/decompression microdiscectomy Right 08/30/2013    Procedure: Right Lumbar four-five Extraforaminal Diskectomy;  Surgeon:  Hosie Spangle, MD;  Location: [facility];  Service: Neurosurgery;  Laterality: Right;    Social History  Substance Use Topics  . Smoking status: Former Smoker -- 20 years    Quit date: 12/12/1988  . Smokeless tobacco: Never Used     Comment: "random smoker"  . Alcohol Use: 4.2 oz/week    7 Standard drinks or equivalent per week     Comment:      Family History  Problem Relation Age of Onset  . Heart disease Mother   . Hypertension Mother   . Diabetes Mother   . Multiple myeloma Father   . Breast cancer Maternal Aunt     Age 71's  . Heart disease Maternal Uncle   . Heart disease Paternal Grandfather     Allergies  Allergen Reactions  . Codeine Nausea Only  . Erythromycin Other (See Comments)    Stomach cramping  . Sulfa Antibiotics     As a child  . Sulfonamide Derivatives   . Tetracyclines & Related     unknown  . Tramadol Nausea Only and Other  (See Comments)    Dizziness and mind racing    Medication list has been reviewed and updated.  Current Outpatient Prescriptions on File Prior to Visit  Medication Sig Dispense Refill  . atorvastatin (LIPITOR) 40 MG tablet Take 1 tablet (40 mg total) by mouth daily at 6 PM. 30 tablet 11  . Calcium Carbonate-Vitamin D (CALCIUM + D PO) Take 2 tablets by mouth daily.     . chlorhexidine (PERIDEX) 0.12 % solution     . losartan (COZAAR) 50 MG tablet TAKE 1 TABLET ONCE DAILY. 90 tablet 2  . Magnesium 250 MG TABS Take 250 mg by mouth daily.    . Multiple Vitamin (MULTIVITAMIN) capsule Take 1 capsule by mouth daily.      . Probiotic Product (PROBIOTIC DAILY PO) Take 1 tablet by mouth daily.    . traZODone (DESYREL) 50 MG tablet Take 25 mg by mouth at bedtime.     No current facility-administered medications on file prior to visit.    Review of Systems:  As per HPI- otherwise negative.   Physical Examination: Filed Vitals:   01/11/16 1014  BP: 126/74  Pulse: 56  Temp: 98.1 F (36.7 C)  Resp: 18   Filed Vitals:   01/11/16 1014  Height: '5\' 4"'  (1.626 m)  Weight: 112 lb (50.803 kg)   Body mass index is 19.22 kg/(m^2). Ideal Body Weight: Weight in (lb) to have BMI = 25: 145.3  GEN: WDWN, NAD, Non-toxic, A & O x 3, slim build, looks well HEENT: Atraumatic, Normocephalic. Neck supple. No masses, No LAD.  Bilateral TM wnl, oropharynx normal.  PEERL,EOMI.   Ears and Nose: No external deformity. CV: RRR, No M/G/R. No JVD. No thrill. No extra heart sounds. PULM: CTA B, no wheezes, crackles, rhonchi. No retractions. No resp. distress. No accessory muscle use.Marland Kitchen EXTR: No c/c/e NEURO Normal gait.  PSYCH: Normally interactive. Conversant. Not depressed or anxious appearing.  Calm demeanor.  Normal BLE strength and sensation Decreased right patellar DTR- per pt this is now new She has normal lumbar flexion and extension Negative SLR bilaterally She notes the discomfort on the left lower  back/ buttock but this is not reproducible with palpation    Assessment and Plan: Left-sided low back pain without sciatica - Plan: Ambulatory referral to Physical Therapy  Referral to PT done for her today She will see her spine  orthopedist this week She has flexeril to use as needed  Signed Lamar Blinks, MD

## 2016-01-11 NOTE — Patient Instructions (Addendum)
We will make a referral to Dr. Belinda Block for you.  However it will be fastest for you to call and schedule an appt! [facility]  Address: [address], [place], [place] [address] Phone: [phone]  Please see Dr. Audry Pili on Thursday as planned.    It is ok to take ibuprofen up to 600- 800 mg three times a day for your pain.   Take as little as you can to control your pain. It is ok to use this along with the flexeril  If we can help you in any way please let me know!

## 2016-01-13 DIAGNOSIS — M545 Low back pain: Secondary | ICD-10-CM | POA: Diagnosis not present

## 2016-01-14 DIAGNOSIS — M545 Low back pain: Secondary | ICD-10-CM | POA: Diagnosis not present

## 2016-01-14 DIAGNOSIS — M47817 Spondylosis without myelopathy or radiculopathy, lumbosacral region: Secondary | ICD-10-CM | POA: Diagnosis not present

## 2016-01-15 DIAGNOSIS — H1010 Acute atopic conjunctivitis, unspecified eye: Secondary | ICD-10-CM | POA: Diagnosis not present

## 2016-01-18 DIAGNOSIS — L309 Dermatitis, unspecified: Secondary | ICD-10-CM | POA: Diagnosis not present

## 2016-01-18 DIAGNOSIS — L601 Onycholysis: Secondary | ICD-10-CM | POA: Diagnosis not present

## 2016-01-18 DIAGNOSIS — M545 Low back pain: Secondary | ICD-10-CM | POA: Diagnosis not present

## 2016-01-20 ENCOUNTER — Encounter: Payer: Self-pay | Admitting: Gynecology

## 2016-01-25 DIAGNOSIS — M545 Low back pain: Secondary | ICD-10-CM | POA: Diagnosis not present

## 2016-01-25 DIAGNOSIS — F4322 Adjustment disorder with anxiety: Secondary | ICD-10-CM | POA: Diagnosis not present

## 2016-01-26 DIAGNOSIS — J3089 Other allergic rhinitis: Secondary | ICD-10-CM | POA: Diagnosis not present

## 2016-01-28 DIAGNOSIS — M545 Low back pain: Secondary | ICD-10-CM | POA: Diagnosis not present

## 2016-01-29 DIAGNOSIS — J3089 Other allergic rhinitis: Secondary | ICD-10-CM | POA: Diagnosis not present

## 2016-02-01 DIAGNOSIS — M545 Low back pain: Secondary | ICD-10-CM | POA: Diagnosis not present

## 2016-02-01 DIAGNOSIS — J3089 Other allergic rhinitis: Secondary | ICD-10-CM | POA: Diagnosis not present

## 2016-02-03 DIAGNOSIS — F4322 Adjustment disorder with anxiety: Secondary | ICD-10-CM | POA: Diagnosis not present

## 2016-02-03 DIAGNOSIS — M545 Low back pain: Secondary | ICD-10-CM | POA: Diagnosis not present

## 2016-02-05 DIAGNOSIS — J3089 Other allergic rhinitis: Secondary | ICD-10-CM | POA: Diagnosis not present

## 2016-02-08 ENCOUNTER — Telehealth: Payer: Self-pay | Admitting: Internal Medicine

## 2016-02-08 DIAGNOSIS — M545 Low back pain: Secondary | ICD-10-CM | POA: Diagnosis not present

## 2016-02-08 MED ORDER — DICYCLOMINE HCL 10 MG PO CAPS: 10.0000 mg | ORAL_CAPSULE | Freq: Three times a day (TID) | ORAL | Status: DC

## 2016-02-08 NOTE — Telephone Encounter (Signed)
Pt aware and bentyl sent to pharmacy.

## 2016-02-08 NOTE — Telephone Encounter (Signed)
Pt states she ate something last night that has caused her to have terrible abdominal cramping and a little diarrhea. Pt states she has IBS and wants to know if she can have something called in for the cramping. Please advise.

## 2016-02-08 NOTE — Telephone Encounter (Signed)
Bentyl or Levsin when necessary would be fine. Thanks

## 2016-02-10 DIAGNOSIS — J3089 Other allergic rhinitis: Secondary | ICD-10-CM | POA: Diagnosis not present

## 2016-02-15 DIAGNOSIS — F4322 Adjustment disorder with anxiety: Secondary | ICD-10-CM | POA: Diagnosis not present

## 2016-02-15 DIAGNOSIS — M545 Low back pain: Secondary | ICD-10-CM | POA: Diagnosis not present

## 2016-02-17 ENCOUNTER — Ambulatory Visit (INDEPENDENT_AMBULATORY_CARE_PROVIDER_SITE_OTHER): Payer: Medicare Other | Admitting: Gynecology

## 2016-02-17 ENCOUNTER — Encounter: Payer: Self-pay | Admitting: Gynecology

## 2016-02-17 VITALS — BP 118/74

## 2016-02-17 DIAGNOSIS — M810 Age-related osteoporosis without current pathological fracture: Secondary | ICD-10-CM | POA: Diagnosis not present

## 2016-02-17 NOTE — Progress Notes (Signed)
    PHILIPPINE NEWVINE 31-Jul-1946 [id number]        70 y.o.  [id number] Presents to discuss her most recent bone density that she had done at [facility] per Dr. [name].  Report shows T score left femoral neck -2.5 noting prior T score 2011-2.4. AP spine T score -1.6 with prior T score 2011 -1.4. History of bisphosphate use for approximately 5 years discontinued 2011. FRAX calculation showed 10 year probability of major osteoporotic fracture 12%, hip fracture 3.3% recognizing that she had been treated previously and does have a diagnosis of osteoporosis with T score -2.5 at the left femoral neck.  Past medical history,surgical history, problem list, medications, allergies, family history and social history were all reviewed and documented in the EPIC chart.  Directed ROS with pertinent positives and negatives documented in the history of present illness/assessment and plan.  Exam: Filed Vitals:   02/17/16 1506  BP: 118/74   General appearance:  Normal   Assessment/Plan:  70 y.o. [id number] with osteoporosis diagnosis T score -2.5 left femoral neck. Prior use of bisphosphonates for approximately 5 years discontinued 2011. Does not appear to have significant decline from prior DEXA. Slight increased risk of hip fracture calculated at 3.3% exceeding the 3% need to treat FRAX recommendation recognizing she was treated previously which may negate this calculation. Patient's otherwise healthy and active and exercises on a regular basis. She is supplementing calcium and vitamin D daily. Do not have a recent vitamin D level on our chart and will go ahead and draw one today. After discussing the risks/benefits of treatment and the various options available the patient and I both ultimately agreed to not treat with medication but continue with calcium/vitamin D supplementation, active lifestyle and will repeat her bone density in 2 years.    [name] MD, 4:58 PM 02/17/2016

## 2016-02-17 NOTE — Patient Instructions (Signed)
Follow-up for annual exam when due. 

## 2016-02-18 LAB — VITAMIN D 25 HYDROXY (VIT D DEFICIENCY, FRACTURES): VIT D 25 HYDROXY: 41 ng/mL (ref 30–100)

## 2016-02-22 DIAGNOSIS — M545 Low back pain: Secondary | ICD-10-CM | POA: Diagnosis not present

## 2016-03-02 DIAGNOSIS — H1045 Other chronic allergic conjunctivitis: Secondary | ICD-10-CM | POA: Diagnosis not present

## 2016-03-02 DIAGNOSIS — J3089 Other allergic rhinitis: Secondary | ICD-10-CM | POA: Diagnosis not present

## 2016-03-02 DIAGNOSIS — K219 Gastro-esophageal reflux disease without esophagitis: Secondary | ICD-10-CM | POA: Diagnosis not present

## 2016-03-02 DIAGNOSIS — M545 Low back pain: Secondary | ICD-10-CM | POA: Diagnosis not present

## 2016-03-02 DIAGNOSIS — J4530 Mild persistent asthma, uncomplicated: Secondary | ICD-10-CM | POA: Diagnosis not present

## 2016-03-09 DIAGNOSIS — M545 Low back pain: Secondary | ICD-10-CM | POA: Diagnosis not present

## 2016-03-14 DIAGNOSIS — H2513 Age-related nuclear cataract, bilateral: Secondary | ICD-10-CM | POA: Diagnosis not present

## 2016-03-14 DIAGNOSIS — H25013 Cortical age-related cataract, bilateral: Secondary | ICD-10-CM | POA: Diagnosis not present

## 2016-03-15 DIAGNOSIS — M545 Low back pain: Secondary | ICD-10-CM | POA: Diagnosis not present

## 2016-03-28 DIAGNOSIS — J3089 Other allergic rhinitis: Secondary | ICD-10-CM | POA: Diagnosis not present

## 2016-03-30 DIAGNOSIS — M545 Low back pain: Secondary | ICD-10-CM | POA: Diagnosis not present

## 2016-03-30 DIAGNOSIS — M47817 Spondylosis without myelopathy or radiculopathy, lumbosacral region: Secondary | ICD-10-CM | POA: Diagnosis not present

## 2016-04-06 DIAGNOSIS — M545 Low back pain: Secondary | ICD-10-CM | POA: Diagnosis not present

## 2016-04-11 ENCOUNTER — Telehealth: Payer: Self-pay | Admitting: *Deleted

## 2016-04-11 NOTE — Telephone Encounter (Signed)
Pt walked in to office asking to make an appointment with Dr Aundra Dubin.   Pt states in the last week or so she has been more short of breath and feeling more flutters. Pt does not appear to be short of breath at this time and does not appear to be in any acute distress.   Pt was  offered and accepted appt tomorrow at 11:30AM  with Richardson Dopp, PA.

## 2016-04-11 NOTE — Progress Notes (Signed)
Cardiology Office Note:    Date:  04/12/2016   ID:  [name], DOB 1946/10/15, MRN [id number]  PCP:  Irven Shelling, MD  Cardiologist:  Dr. [name]   Electrophysiologist:  n/a  Referring MD: [name], MD   Chief Complaint  Patient presents with  . Shortness of Breath    History of Present Illness:     Madison [facility] is a 70 y.o. female with a hx of HTN, HL, atypical chest pain and ectopic atrial rhythm.  She saw Dr. Rollene Fare in the past. She had a Cardiolite in 3/11 that was probably normal. She had an echo in 4/14 with no significant abnormalities and normal EF. Last seen by Dr. [name] in 5/16.    Patient walked in yesterday to make an appointment for increasing dyspnea. She could not get in to see Dr. Aundra Dubin until August but wanted to be seen sooner.  She was placed on my schedule today for evaluation.  She notes that she has had some chest tightness and dyspnea over the past few weeks.  This seems to occur at different times. She exercises quite a bit.  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  Also walks on a treadmill (15 min mile).  She swims every other day for 20 minutes. She denies exertional chest pain.  Denies any assoc symptoms.  Denies symptoms related to meals. She has not tried any antacids. Denies dysphagia or odynophagia. Denies any bleeding. She does have a significant history of IBS and is followed by GI. She is under some stress with work. Yesterday, she noted that she was struggling more with her swim than usual. In the office today, she does note some tightness at rest.   Past Medical History  Diagnosis Date  . Lichen sclerosus et atrophicus of the vulva 2008    Biopsy-proven  . Osteopenia 10/2015    T score -2.5 left femoral neck  . Elevated cholesterol   . Hypertension   . Back pain   . Asthma   . Allergic rhinitis   . Lactose intolerance   . GERD (gastroesophageal reflux disease)   . Arthritis   . Cataract     Hx; of  .  Anxiety   . Pneumonia   . Raynaud's syndrome     "possible"  . Hemoptysis 2007    From reflux pharyngitis  . Insomnia   . Nocturia   . Atypical chest pain     Possibly from DES  . Diverticulosis     Left  1. GERD 2. HTN 3. Raynaud's phenomenon 4. Allergic rhinitis 5. H/o atypical chest pain: ETT Cardiolite 3/11 with 10 METS exercise, likely normal with breast attenuation. Echo (4/14) with EF 55-60%, no significant abnormalities.  6. DDD/Low back pain. 7. Diverticulosis 8. Asthma: Followed by Dr. Melvyn Novas.  9. Palpitations 10. Ectopic atrial rhythm   Past Surgical History  Procedure Laterality Date  . Breast surgery  2001    REDUCTION MAMMAPLASTY  . Tubal ligation  1987  . Inguinal hernia repair  1971    BILATERAL  . Hysteroscopy  2003    HYSTEROSCOPIC RESECTION OF MYOMA  . Foot surgery  2009  . Tonsillectomy    . Mandible surgery    . Myomectomy    . Colonoscopy    . Lumbar laminectomy/decompression microdiscectomy Right 08/30/2013    Procedure: Right Lumbar four-five Extraforaminal Diskectomy;  Surgeon: Hosie Spangle, MD;  Location: [facility];  Service: Neurosurgery;  Laterality: Right;    Current Medications:  Current Meds  Medication Sig  . atorvastatin (LIPITOR) 40 MG tablet Take 1 tablet (40 mg total) by mouth daily at 6 PM.  . Calcium Carbonate-Vitamin D (CALCIUM + D PO) Take 2 tablets by mouth daily.   . Cholecalciferol (VITAMIN D PO) Take 1 tablet by mouth daily. PATIENT NOT SURE OF DOSAGE  . dicyclomine (BENTYL) 10 MG capsule Take 1 capsule (10 mg total) by mouth 4 (four) times daily -  before meals and at bedtime.  Marland Kitchen losartan (COZAAR) 50 MG tablet TAKE 1 TABLET ONCE DAILY.  . Magnesium 250 MG TABS Take 250 mg by mouth daily.  . meloxicam (MOBIC) 15 MG tablet Take 15 mg by mouth daily as needed for pain.   . Multiple Vitamin (MULTIVITAMIN) capsule Take 1 capsule by mouth daily.    . Probiotic Product (PROBIOTIC DAILY PO) Take 1 tablet by mouth  daily.  . traZODone (DESYREL) 50 MG tablet Take 25 mg by mouth at bedtime.    Allergies:   Codeine; Erythromycin; Sulfa antibiotics; Sulfamethoxazole; Sulfonamide derivatives; Tetracyclines & related; and Tramadol   Social History   Social History  . Marital Status: Divorced    Spouse Name: N/A  . Number of Children: 2  . Years of Education: N/A   Occupational History  . REaltor    Social History Main Topics  . Smoking status: Former Smoker -- 20 years    Quit date: 12/12/1988  . Smokeless tobacco: Never Used     Comment: "random smoker"  . Alcohol Use: 4.2 oz/week    7 Standard drinks or equivalent per week     Comment:    . Drug Use: No  . Sexual Activity: No     Comment: 1st intercourse 70 yo-Fewer than 5 partners   Other Topics Concern  . None   Social History Narrative     Family History:  The patient's family history includes Breast cancer in her maternal aunt; Diabetes in her mother; Heart disease in her maternal uncle, mother, and paternal grandfather; Hypertension in her mother; Multiple myeloma in her father.   ROS:   Please see the history of present illness.    Review of Systems  Cardiovascular: Positive for irregular heartbeat.  Hematologic/Lymphatic: Bruises/bleeds easily.  Musculoskeletal: Positive for back pain and myalgias.  Psychiatric/Behavioral: The patient is nervous/anxious.    All other systems reviewed and are negative.   Physical Exam:    VS:  BP 110/62 mmHg  Pulse 78  Ht _0  (1.626 m)  Wt 114 lb (51.71 kg)  BMI 19.56 kg/m2   GEN: Well nourished, well developed, in no acute distress HEENT: normal Neck: no JVD, no masses Cardiac: Normal S1/S2, RRR; no murmurs, rubs, or gallops, no edema;  no carotid bruits,   Respiratory:  clear to auscultation bilaterally; no wheezing, rhonchi or rales GI: soft, nontender, nondistended MS: no deformity or atrophy Skin: warm and dry Neuro: No focal deficits  Psych: Alert and oriented x 3, normal  affect  Wt Readings from Last 3 Encounters:  04/12/16 114 lb (51.71 kg)  01/11/16 112 lb (50.803 kg)  07/09/15 114 lb (51.71 kg)      Studies/Labs Reviewed:     EKG:  EKG is  ordered today.  The ekg ordered today demonstrates NSR/ectopic atrial rhythm, HR 78, normal axis, no ST changes, QTc 417 ms, no change from prior tracing  Recent Labs: No results found for requested labs within last 365  days.   Recent Lipid Panel    Component Value Date/Time   CHOL 158 08/18/2015 0946   CHOL 195 06/18/2014 0852   TRIG 110 08/18/2015 0946   TRIG 102.0 06/18/2014 0852   HDL 74 08/18/2015 0946   HDL 75.60 06/18/2014 0852   CHOLHDL 3 06/18/2014 0852   VLDL 20.4 06/18/2014 0852   LDLCALC 62 08/18/2015 0946   LDLCALC 99 06/18/2014 0852    Additional studies/ records that were reviewed today include:   Echo 4/14 EF 55-60%, no RWMA, Gr 1 DD  Myoview 3/11 Breast atten, EF 74%, no EKG changes (no rest images)   ASSESSMENT:     1. Chest pain, unspecified chest pain type   2. Essential hypertension   3. Hyperlipemia   4. Palpitations     PLAN:     In order of problems listed above:  1. Chest pain - She has atypical symptoms for ischemia.  She does have significant risk factors of FHx, HL.  It has been several years since she had a stress test.  She did inquire about a Ca Score.  I am not sure how helpful that would be given her age of 89 and the fact she is already on mod intensity statin.  She prefers to hold off and will d/w Dr. [name] further at her scheduled FU in 07/2016.  She stopped taking ASA due to bruising.   -  Arrange ETT Myoview  -  Try to take ASA 3 days a week to see if she can tolerate.  -  Trial of PPI (Prilosec OTC 20 mg QD) or Pepcid 20 mg bid x 2 weeks >> If symptoms improve, may need to see GI back sooner.  -  FU in August as planned . . . Sooner if Myoview abnormal.   2. HTN - BP controlled.   3. HL - Continue statin.  LDL in 9/16 was 62.    4.  Palpitations - Hx of ectopic atrial rhythm.  Do not think EAR is causing palpitations.  She has had a hx of this. If continues, consider event monitor.     Medication Adjustments/Labs and Tests Ordered: Current medicines are reviewed at length with the patient today.  Concerns regarding medicines are outlined above.  Medication changes, Labs and Tests ordered today are outlined in the Patient Instructions noted below. Patient Instructions  Medication Instructions:  1. TRY OTC PRILOSEC 20 MG DAILY OR YOU CAN TRY OTC PEPCID 20 MG TWICE DAILY FOR 2 WEEKS THEN TAKE ONLY AS NEEDED 2. TRY TAKING ASPIRIN 81 MG ONLY ON MON, WED, AND FRI'S Labwork: NONE Testing/Procedures: Your physician has requested that you have en exercise stress myoview. For further information please visit [email or web address]. Please follow instruction sheet, as given. Follow-Up: KEEP YOUR APPT WITH DR. Aundra Dubin 07/15/16 Any Other Special Instructions Will Be Listed Below (If Applicable). If you need a refill on your cardiac medications before your next appointment, please call your pharmacy.   Signed, Richardson Dopp, PA-C  04/12/2016 4:54 PM    [facility] [address], [place], [place]  [address] Phone: [phone]; Fax: [phone]

## 2016-04-12 ENCOUNTER — Ambulatory Visit (INDEPENDENT_AMBULATORY_CARE_PROVIDER_SITE_OTHER): Payer: Medicare Other | Admitting: Physician Assistant

## 2016-04-12 ENCOUNTER — Encounter: Payer: Self-pay | Admitting: Physician Assistant

## 2016-04-12 VITALS — BP 110/62 | HR 78 | Ht 64.0 in | Wt 114.0 lb

## 2016-04-12 DIAGNOSIS — R002 Palpitations: Secondary | ICD-10-CM | POA: Diagnosis not present

## 2016-04-12 DIAGNOSIS — E785 Hyperlipidemia, unspecified: Secondary | ICD-10-CM | POA: Diagnosis not present

## 2016-04-12 DIAGNOSIS — R079 Chest pain, unspecified: Secondary | ICD-10-CM

## 2016-04-12 DIAGNOSIS — I10 Essential (primary) hypertension: Secondary | ICD-10-CM | POA: Diagnosis not present

## 2016-04-12 DIAGNOSIS — R0602 Shortness of breath: Secondary | ICD-10-CM | POA: Diagnosis not present

## 2016-04-12 MED ORDER — ASPIRIN EC 81 MG PO TBEC: 81.0000 mg | DELAYED_RELEASE_TABLET | ORAL | Status: DC

## 2016-04-12 NOTE — Patient Instructions (Addendum)
Medication Instructions:  1. TRY OTC PRILOSEC 20 MG DAILY OR YOU CAN TRY OTC PEPCID 20 MG TWICE DAILY FOR 2 WEEKS THEN TAKE ONLY AS NEEDED 2. TRY TAKING ASPIRIN 81 MG ONLY ON MON, WED, AND FRI'S Labwork: NONE Testing/Procedures: Your physician has requested that you have en exercise stress myoview. For further information please visit [email or web address]. Please follow instruction sheet, as given. Follow-Up: KEEP YOUR APPT WITH DR. Aundra Dubin 07/15/16 Any Other Special Instructions Will Be Listed Below (If Applicable). If you need a refill on your cardiac medications before your next appointment, please call your pharmacy.

## 2016-04-13 ENCOUNTER — Ambulatory Visit (INDEPENDENT_AMBULATORY_CARE_PROVIDER_SITE_OTHER): Payer: Medicare Other | Admitting: Gynecology

## 2016-04-13 ENCOUNTER — Encounter: Payer: Self-pay | Admitting: Gynecology

## 2016-04-13 VITALS — BP 116/70 | Ht 64.0 in | Wt 114.0 lb

## 2016-04-13 DIAGNOSIS — M810 Age-related osteoporosis without current pathological fracture: Secondary | ICD-10-CM

## 2016-04-13 DIAGNOSIS — N952 Postmenopausal atrophic vaginitis: Secondary | ICD-10-CM

## 2016-04-13 DIAGNOSIS — Z01419 Encounter for gynecological examination (general) (routine) without abnormal findings: Secondary | ICD-10-CM

## 2016-04-13 NOTE — Patient Instructions (Signed)

## 2016-04-13 NOTE — Progress Notes (Signed)
    [facility] 05/07/1946 [id number]        70 y.o.  [id number]  for breast and pelvic exam.  Past medical history,surgical history, problem list, medications, allergies, family history and social history were all reviewed and documented as reviewed in the EPIC chart.  ROS:  Performed with pertinent positives and negatives included in the history, assessment and plan.   Additional significant findings :  none   Exam: Caryn Bee assistant Filed Vitals:   04/13/16 1336  BP: 116/70  Height: 5\' 4"  (1.626 m)  Weight: 114 lb (51.71 kg)   General appearance:  Normal affect, orientation and appearance. Skin: Grossly normal HEENT: Without gross lesions.  No cervical or supraclavicular adenopathy. Thyroid normal.  Lungs:  Clear without wheezing, rales or rhonchi Cardiac: RR, without RMG Abdominal:  Soft, nontender, without masses, guarding, rebound, organomegaly or hernia Breasts:  Examined lying and sitting without masses, retractions, discharge or axillary adenopathy. Pelvic:  Ext/BUS/vagina with atrophic changes  Cervix with atrophic changes  Uterus anteverted, normal size, shape and contour, midline and mobile nontender   Adnexa without masses or tenderness    Anus and perineum normal   Rectovaginal normal sphincter tone without palpated masses or tenderness.    Assessment/Plan:  70 y.o. [id number] female for breast and pelvic exam.  1. Postmenopausal/atrophic changes. Without significant hot flashes, night sweats, vaginal dryness or any vaginal bleeding. Continue to monitor and report any vaginal bleeding or any issues. 2. Pap smear 2016. No Pap smear done today. No history of significant abnormal Pap smears. 3. Mammography 08/2015. Continue with annual mammography when due. History of maternal aunt early onset breast cancer and Jewish heritage. Has been offered genetic screening in the past and declines. SBE monthly reviewed. 4. Lichen sclerosis, biopsy-proven 2008. Without  significant symptoms of itching or irritation. Exam overall is normal with atrophic changes. Continue to monitor report any issues. 5. Colonoscopy 2008. Recommended repeat interval reported 10 years. 6. Osteoporosis. DEXA 10/2015 T score -2.5.  Prior use of bisphosphonates for approximately  5 years discontinued 2011. Discussed treatment options per office note 02/17/2016 and patient had declined any treatment at that time. Was asking about having an early repeat DEXA at one-year interval. She's going to ask Dr. Laurann Montana when she sees him this fall to schedule. 7. Health maintenance. No routine lab work done as patient has this done elsewhere. Follow up one year, sooner as needed.   [name] MD, 2:01 PM 04/13/2016

## 2016-04-15 DIAGNOSIS — M545 Low back pain: Secondary | ICD-10-CM | POA: Diagnosis not present

## 2016-04-20 ENCOUNTER — Telehealth (HOSPITAL_COMMUNITY): Payer: Self-pay | Admitting: *Deleted

## 2016-04-20 NOTE — Telephone Encounter (Signed)
Left message on voicemail in reference to upcoming appointment scheduled for 04/25/16. Phone number given for a call back so details instructions can be given. [facility], [name]

## 2016-04-21 ENCOUNTER — Telehealth (HOSPITAL_COMMUNITY): Payer: Self-pay | Admitting: *Deleted

## 2016-04-21 DIAGNOSIS — M76821 Posterior tibial tendinitis, right leg: Secondary | ICD-10-CM | POA: Diagnosis not present

## 2016-04-21 NOTE — Telephone Encounter (Signed)
Patient given detailed instructions per Myocardial Perfusion Study Information Sheet for the test on 04/25/16 at 0945. Patient notified to arrive 15 minutes early and that it is imperative to arrive on time for appointment to keep from having the test rescheduled.  If you need to cancel or reschedule your appointment, please call the office within 24 hours of your appointment. Failure to do so may result in a cancellation of your appointment, and a $50 no show fee. Patient verbalized understanding.Santana Gosdin, [name]

## 2016-04-22 DIAGNOSIS — J3089 Other allergic rhinitis: Secondary | ICD-10-CM | POA: Diagnosis not present

## 2016-04-25 ENCOUNTER — Encounter: Payer: Self-pay | Admitting: Physician Assistant

## 2016-04-25 ENCOUNTER — Telehealth: Payer: Self-pay | Admitting: *Deleted

## 2016-04-25 ENCOUNTER — Ambulatory Visit (HOSPITAL_COMMUNITY): Payer: Medicare Other | Attending: Cardiovascular Disease

## 2016-04-25 DIAGNOSIS — I10 Essential (primary) hypertension: Secondary | ICD-10-CM | POA: Insufficient documentation

## 2016-04-25 DIAGNOSIS — R079 Chest pain, unspecified: Secondary | ICD-10-CM | POA: Insufficient documentation

## 2016-04-25 DIAGNOSIS — R002 Palpitations: Secondary | ICD-10-CM | POA: Insufficient documentation

## 2016-04-25 DIAGNOSIS — R0602 Shortness of breath: Secondary | ICD-10-CM | POA: Insufficient documentation

## 2016-04-25 LAB — MYOCARDIAL PERFUSION IMAGING
CHL CUP MPHR: 151 {beats}/min
CHL CUP NUCLEAR SDS: 0
CHL CUP NUCLEAR SRS: 4
CHL CUP NUCLEAR SSS: 4
CHL CUP RESTING HR STRESS: 62 {beats}/min
CSEPEDS: 0 s
CSEPHR: 87 %
Estimated workload: 10.1 METS
Exercise duration (min): 8 min
LHR: 0.36
LV sys vol: 17 mL
LVDIAVOL: 57 mL (ref 46–106)
NUC STRESS TID: 0.94
Peak HR: 133 {beats}/min
RPE: 18

## 2016-04-25 MED ORDER — TECHNETIUM TC 99M TETROFOSMIN IV KIT
31.3000 | PACK | Freq: Once | INTRAVENOUS | Status: AC | PRN
  Administered 2016-04-25: 31.3 via INTRAVENOUS
  Filled 2016-04-25: qty 31

## 2016-04-25 MED ORDER — TECHNETIUM TC 99M TETROFOSMIN IV KIT
10.6000 | PACK | Freq: Once | INTRAVENOUS | Status: AC | PRN
  Administered 2016-04-25: 11 via INTRAVENOUS
  Filled 2016-04-25: qty 11

## 2016-04-25 NOTE — Telephone Encounter (Signed)
Pt notified of myoview results by phone with verbal understanding 

## 2016-05-05 ENCOUNTER — Other Ambulatory Visit: Payer: Self-pay | Admitting: Cardiology

## 2016-05-05 NOTE — Telephone Encounter (Signed)
Rx refill sent to pharmacy. 

## 2016-05-13 DIAGNOSIS — J3089 Other allergic rhinitis: Secondary | ICD-10-CM | POA: Diagnosis not present

## 2016-05-16 DIAGNOSIS — M545 Low back pain: Secondary | ICD-10-CM | POA: Diagnosis not present

## 2016-06-03 DIAGNOSIS — F4322 Adjustment disorder with anxiety: Secondary | ICD-10-CM | POA: Diagnosis not present

## 2016-06-08 DIAGNOSIS — H2511 Age-related nuclear cataract, right eye: Secondary | ICD-10-CM | POA: Diagnosis not present

## 2016-06-08 DIAGNOSIS — H25011 Cortical age-related cataract, right eye: Secondary | ICD-10-CM | POA: Diagnosis not present

## 2016-06-10 DIAGNOSIS — F4322 Adjustment disorder with anxiety: Secondary | ICD-10-CM | POA: Diagnosis not present

## 2016-06-16 ENCOUNTER — Other Ambulatory Visit: Payer: Self-pay | Admitting: Cardiology

## 2016-06-17 DIAGNOSIS — J3089 Other allergic rhinitis: Secondary | ICD-10-CM | POA: Diagnosis not present

## 2016-06-28 ENCOUNTER — Encounter: Payer: Self-pay | Admitting: *Deleted

## 2016-06-29 DIAGNOSIS — H25012 Cortical age-related cataract, left eye: Secondary | ICD-10-CM | POA: Diagnosis not present

## 2016-06-29 DIAGNOSIS — H25011 Cortical age-related cataract, right eye: Secondary | ICD-10-CM | POA: Diagnosis not present

## 2016-06-29 DIAGNOSIS — H2511 Age-related nuclear cataract, right eye: Secondary | ICD-10-CM | POA: Diagnosis not present

## 2016-06-29 DIAGNOSIS — H2512 Age-related nuclear cataract, left eye: Secondary | ICD-10-CM | POA: Diagnosis not present

## 2016-07-01 DIAGNOSIS — F4322 Adjustment disorder with anxiety: Secondary | ICD-10-CM | POA: Diagnosis not present

## 2016-07-06 DIAGNOSIS — H25012 Cortical age-related cataract, left eye: Secondary | ICD-10-CM | POA: Diagnosis not present

## 2016-07-06 DIAGNOSIS — H2512 Age-related nuclear cataract, left eye: Secondary | ICD-10-CM | POA: Diagnosis not present

## 2016-07-14 DIAGNOSIS — J3089 Other allergic rhinitis: Secondary | ICD-10-CM | POA: Diagnosis not present

## 2016-07-15 ENCOUNTER — Ambulatory Visit (INDEPENDENT_AMBULATORY_CARE_PROVIDER_SITE_OTHER): Payer: Medicare Other | Admitting: Cardiology

## 2016-07-15 VITALS — BP 112/56 | HR 56 | Ht 64.0 in | Wt 113.0 lb

## 2016-07-15 DIAGNOSIS — E785 Hyperlipidemia, unspecified: Secondary | ICD-10-CM | POA: Diagnosis not present

## 2016-07-15 DIAGNOSIS — I10 Essential (primary) hypertension: Secondary | ICD-10-CM

## 2016-07-15 NOTE — Patient Instructions (Addendum)
Medication Instructions:  Your physician recommends that you continue on your current medications as directed. Please refer to the Current Medication list given to you today.   Labwork: Your physician recommends that you return for lab work October 2017. This is scheduled for Wednesday October 18,2017 at [facility] not eat or drink after midnight the night before your lab.  Testing/Procedures: none  Follow-Up: Your physician wants you to follow-up in: 1 year with Dr Meda Coffee. (August 2018).You will receive a reminder letter in the mail two months in advance. If you don't receive a letter, please call our office to schedule the follow-up appointment.        If you need a refill on your cardiac medications before your next appointment, please call your pharmacy.

## 2016-07-17 ENCOUNTER — Encounter: Payer: Self-pay | Admitting: Cardiology

## 2016-07-17 NOTE — Progress Notes (Signed)
Patient ID: [facility], female   DOB: [birth year], 70 y.o.   MRN: [id number] PCP: Dr. [facility]  70 yo with history of atypical chest pain and palpitations presents for cardiology followup.  She had a Cardiolite in 3/11 that was probably normal.  She had an echo in 4/14 with no significant abnormalities and normal EF.  She had increased fatigue in 5/17 and had a Cardiolite with no ischemia or infarction.  She works out daily (swimming, [employer], stationary bike).  No exertional dyspnea or chest pain though she feels like her endurance is less than in the past.  She still works full time as a Forensic psychologist.  BP is under control on losartan.  Lipids in 11/16 were good.   ECG (5/17): Abnormal P axis, suspect ectopic atrial rhythm with rate 78  Labs (8/14): LDL 95, HDL 71 Labs (10/14): K 4.3, creatinine 0.61 Labs (7/15): LDL 99, HDL 76 Labs (11/16): K 4.1, creatinine 0.64, LDL 87, HDL 74  PMH: 1. GERD 2. HTN 3. Raynaud's phenomenon 4. Allergic rhinitis 5. H/o atypical chest pain: ETT Cardiolite 3/11 with 10 METS exercise, likely normal with breast attenuation.  Echo (4/14) with EF 55-60%, no significant abnormalities.  ETT Cardiolite (5/17) with 8' exercise, EF 70%, no ischemia or infarction.  6. DDD/Low back pain. 7. Diverticulosis 8. Asthma: Followed by Dr. Melv[facility].  9. 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  Real estate agent.  Divorced, lives in [place].    FH: Mother with ?SCD.   ROS: All systems reviewed and negative except as per HPI  Current Outpatient Prescriptions  Medication Sig Dispense Refill  . atorvastatin (LIPITOR) 40 MG tablet TAKE 1 TABLET ONCE DAILY AT 6 PM. 30 tablet 10  . Calcium Carbonate-Vitamin D (CALCIUM + D PO) Take 2 tablets by mouth daily.     . Cholecalciferol (VITAMIN D PO) Take 1 tablet by mouth daily. PATIENT NOT SURE OF DOSAGE    . losartan (COZAAR) 50 MG tablet TAKE 1 TABLET ONCE DAILY. 90 tablet 0  . Magnesium 250 MG  TABS Take 250 mg by mouth daily.    . Multiple Vitamin (MULTIVITAMIN) capsule Take 1 capsule by mouth daily.      . prednisoLONE acetate (PRED FORTE) 1 % ophthalmic suspension Place 1 drop into the right eye 3 times daily for 5 days.    . Probiotic Product (PROBIOTIC DAILY PO) Take 1 tablet by mouth daily.    . traZODone (DESYREL) 50 MG tablet Take 25 mg by mouth at bedtime.     No current facility-administered medications for this visit.    BP (!) 112/56   Pulse (!) 56   Ht 5\' 4"  (1.626 m)   Wt 113 lb (51.3 kg)   BMI 19.40 kg/m  General: NAD Neck: No JVD, no thyromegaly or thyroid nodule.  Lungs: Clear to auscultation bilaterally with normal respiratory effort. CV: Nondisplaced PMI.  Heart regular S1/S2, no S3/S4, no murmur.  No peripheral edema.  No carotid bruit.  Normal pedal pulses.  Abdomen: Soft, nontender, no hepatosplenomegaly, no distention.  Skin: Intact without lesions or rashes.  Neurologic: Alert and oriented x 3.  Psych: Normal affect. Extremities: No clubbing or cyanosis.  HEENT: Normal.   Assessment/Plan: 1. Chest pain: History of atypical chest pain.  No recent symptoms.  Negative Cardiolite in 5/17.   2. Hyperlipidemia: Good lipids in 11/16.  I will arrange for Lipomed profile in 11/17.  3. HTN: BP  is controlled on current regimen.  4. Palpitations: Minimal.  5. Ectopic atrial rhythm:  I do not think this is causing her any problems at this time, will simply monitor.   Followup in 1 year.  As I will be transitioning to the CHF clinic full time now, will have her see Dr. Meda [facility].   Madison [facility] 07/17/2016

## 2016-07-26 DIAGNOSIS — F4322 Adjustment disorder with anxiety: Secondary | ICD-10-CM | POA: Diagnosis not present

## 2016-08-08 ENCOUNTER — Other Ambulatory Visit: Payer: Self-pay | Admitting: Cardiology

## 2016-08-09 DIAGNOSIS — D2262 Melanocytic nevi of left upper limb, including shoulder: Secondary | ICD-10-CM | POA: Diagnosis not present

## 2016-08-09 DIAGNOSIS — L821 Other seborrheic keratosis: Secondary | ICD-10-CM | POA: Diagnosis not present

## 2016-08-09 DIAGNOSIS — L820 Inflamed seborrheic keratosis: Secondary | ICD-10-CM | POA: Diagnosis not present

## 2016-08-09 DIAGNOSIS — L905 Scar conditions and fibrosis of skin: Secondary | ICD-10-CM | POA: Diagnosis not present

## 2016-08-09 DIAGNOSIS — D2272 Melanocytic nevi of left lower limb, including hip: Secondary | ICD-10-CM | POA: Diagnosis not present

## 2016-08-09 DIAGNOSIS — D2271 Melanocytic nevi of right lower limb, including hip: Secondary | ICD-10-CM | POA: Diagnosis not present

## 2016-08-09 DIAGNOSIS — L601 Onycholysis: Secondary | ICD-10-CM | POA: Diagnosis not present

## 2016-08-09 DIAGNOSIS — D225 Melanocytic nevi of trunk: Secondary | ICD-10-CM | POA: Diagnosis not present

## 2016-08-09 DIAGNOSIS — D2261 Melanocytic nevi of right upper limb, including shoulder: Secondary | ICD-10-CM | POA: Diagnosis not present

## 2016-08-09 DIAGNOSIS — D224 Melanocytic nevi of scalp and neck: Secondary | ICD-10-CM | POA: Diagnosis not present

## 2016-08-22 DIAGNOSIS — J3089 Other allergic rhinitis: Secondary | ICD-10-CM | POA: Diagnosis not present

## 2016-08-26 ENCOUNTER — Encounter: Payer: Self-pay | Admitting: Gynecology

## 2016-08-26 DIAGNOSIS — Z803 Family history of malignant neoplasm of breast: Secondary | ICD-10-CM | POA: Diagnosis not present

## 2016-08-26 DIAGNOSIS — Z1231 Encounter for screening mammogram for malignant neoplasm of breast: Secondary | ICD-10-CM | POA: Diagnosis not present

## 2016-08-26 DIAGNOSIS — F4322 Adjustment disorder with anxiety: Secondary | ICD-10-CM | POA: Diagnosis not present

## 2016-09-14 DIAGNOSIS — J3489 Other specified disorders of nose and nasal sinuses: Secondary | ICD-10-CM | POA: Diagnosis not present

## 2016-09-14 DIAGNOSIS — Z23 Encounter for immunization: Secondary | ICD-10-CM | POA: Diagnosis not present

## 2016-09-20 DIAGNOSIS — J3089 Other allergic rhinitis: Secondary | ICD-10-CM | POA: Diagnosis not present

## 2016-09-26 DIAGNOSIS — F4322 Adjustment disorder with anxiety: Secondary | ICD-10-CM | POA: Diagnosis not present

## 2016-09-28 ENCOUNTER — Other Ambulatory Visit: Payer: Medicare Other | Admitting: *Deleted

## 2016-09-28 DIAGNOSIS — E7800 Pure hypercholesterolemia, unspecified: Secondary | ICD-10-CM | POA: Diagnosis not present

## 2016-09-28 DIAGNOSIS — E785 Hyperlipidemia, unspecified: Secondary | ICD-10-CM

## 2016-09-28 DIAGNOSIS — I10 Essential (primary) hypertension: Secondary | ICD-10-CM

## 2016-10-01 LAB — CARDIO IQ(R) ADVANCED LIPID PANEL
Apolipoprotein B: 91 mg/dL (ref 49–103)
CHOLESTEROL, TOTAL (CARDIO IQ ADV LIPID PANEL): 200 mg/dL — AB (ref <200)
Cholesterol/HDL Ratio: 2.6 calc (ref <5.0)
HDL Cholesterol: 78 mg/dL (ref >50)
LDL CHOLESTEROL CALCULATED (CARDIO IQ ADV LIPID PANEL): 97 mg/dL (ref <100)
LDL LARGE: 6658 nmol/L (ref 5038–17886)
LDL MEDIUM: 231 nmol/L (ref 121–397)
LDL PARTICLE NUMBER: 1191 nmol/L (ref 1016–2185)
LDL PEAK SIZE: 218.2 Angstrom (ref >=218.2)
LDL Small: 196 nmol/L (ref 115–386)
Lipoprotein (a): 57 nmol/L (ref <75)
NON-HDL CHOLESTEROL (CARDIO IQ ADV LIPID PANEL): 122 mg/dL (ref <130)
Triglycerides: 149 mg/dL (ref <150)

## 2016-10-17 DIAGNOSIS — J3089 Other allergic rhinitis: Secondary | ICD-10-CM | POA: Diagnosis not present

## 2016-10-17 DIAGNOSIS — I10 Essential (primary) hypertension: Secondary | ICD-10-CM | POA: Diagnosis not present

## 2016-10-17 DIAGNOSIS — M5136 Other intervertebral disc degeneration, lumbar region: Secondary | ICD-10-CM | POA: Diagnosis not present

## 2016-10-17 DIAGNOSIS — G4700 Insomnia, unspecified: Secondary | ICD-10-CM | POA: Diagnosis not present

## 2016-10-17 DIAGNOSIS — Z0000 Encounter for general adult medical examination without abnormal findings: Secondary | ICD-10-CM | POA: Diagnosis not present

## 2016-10-17 DIAGNOSIS — E7800 Pure hypercholesterolemia, unspecified: Secondary | ICD-10-CM | POA: Diagnosis not present

## 2016-10-17 DIAGNOSIS — Z1389 Encounter for screening for other disorder: Secondary | ICD-10-CM | POA: Diagnosis not present

## 2016-10-28 ENCOUNTER — Telehealth: Payer: Self-pay | Admitting: Cardiology

## 2016-10-28 NOTE — Telephone Encounter (Signed)
Spoke with patient who c/o right thigh soreness from groin to knee and wonders if it is being caused by her atorvastatin.  She states she has been on atorvastatin for quite a long time.  She recently started squat exercises twice daily.  I advised her that pain from statin medications typically starts soon after starting.  I asked if she had recent liver panel at Dr. Delene Ruffini office and she confirms. I advised her to call their office and verify that those values were normal. I asked her to call back if there is any elevation in liver enzymes.  I advised her to stop the squatting exercises to see if pain resolves and if so to seek professional help to check her form before resuming these exercises . I advised if pain persists despite stopping squats, to call back for advice about changing cholesterol meds.  She verbalized understanding and agreement with plan.

## 2016-10-28 NOTE — Telephone Encounter (Signed)
Patient has a question about her medications; specifically her atorvastatin.  Please return pt call.

## 2016-11-08 DIAGNOSIS — H1045 Other chronic allergic conjunctivitis: Secondary | ICD-10-CM | POA: Diagnosis not present

## 2016-11-08 DIAGNOSIS — J4530 Mild persistent asthma, uncomplicated: Secondary | ICD-10-CM | POA: Diagnosis not present

## 2016-11-08 DIAGNOSIS — J3089 Other allergic rhinitis: Secondary | ICD-10-CM | POA: Diagnosis not present

## 2016-11-08 DIAGNOSIS — J019 Acute sinusitis, unspecified: Secondary | ICD-10-CM | POA: Diagnosis not present

## 2016-11-11 DIAGNOSIS — F4322 Adjustment disorder with anxiety: Secondary | ICD-10-CM | POA: Diagnosis not present

## 2016-11-17 DIAGNOSIS — J3089 Other allergic rhinitis: Secondary | ICD-10-CM | POA: Diagnosis not present

## 2016-11-18 DIAGNOSIS — F4322 Adjustment disorder with anxiety: Secondary | ICD-10-CM | POA: Diagnosis not present

## 2016-11-29 DIAGNOSIS — M545 Low back pain: Secondary | ICD-10-CM | POA: Diagnosis not present

## 2016-11-29 DIAGNOSIS — M5416 Radiculopathy, lumbar region: Secondary | ICD-10-CM | POA: Diagnosis not present

## 2016-11-30 DIAGNOSIS — F4322 Adjustment disorder with anxiety: Secondary | ICD-10-CM | POA: Diagnosis not present

## 2016-12-08 DIAGNOSIS — K219 Gastro-esophageal reflux disease without esophagitis: Secondary | ICD-10-CM | POA: Diagnosis not present

## 2016-12-08 DIAGNOSIS — H1045 Other chronic allergic conjunctivitis: Secondary | ICD-10-CM | POA: Diagnosis not present

## 2016-12-08 DIAGNOSIS — J3089 Other allergic rhinitis: Secondary | ICD-10-CM | POA: Diagnosis not present

## 2016-12-08 DIAGNOSIS — J4530 Mild persistent asthma, uncomplicated: Secondary | ICD-10-CM | POA: Diagnosis not present

## 2016-12-15 DIAGNOSIS — J3089 Other allergic rhinitis: Secondary | ICD-10-CM | POA: Diagnosis not present

## 2016-12-15 DIAGNOSIS — H1045 Other chronic allergic conjunctivitis: Secondary | ICD-10-CM | POA: Diagnosis not present

## 2016-12-15 DIAGNOSIS — K219 Gastro-esophageal reflux disease without esophagitis: Secondary | ICD-10-CM | POA: Diagnosis not present

## 2016-12-15 DIAGNOSIS — J4530 Mild persistent asthma, uncomplicated: Secondary | ICD-10-CM | POA: Diagnosis not present

## 2016-12-19 DIAGNOSIS — K219 Gastro-esophageal reflux disease without esophagitis: Secondary | ICD-10-CM | POA: Diagnosis not present

## 2016-12-19 DIAGNOSIS — R0981 Nasal congestion: Secondary | ICD-10-CM | POA: Diagnosis not present

## 2016-12-19 DIAGNOSIS — B370 Candidal stomatitis: Secondary | ICD-10-CM | POA: Diagnosis not present

## 2016-12-30 DIAGNOSIS — J3089 Other allergic rhinitis: Secondary | ICD-10-CM | POA: Diagnosis not present

## 2017-01-02 DIAGNOSIS — F4322 Adjustment disorder with anxiety: Secondary | ICD-10-CM | POA: Diagnosis not present

## 2017-01-05 DIAGNOSIS — M5126 Other intervertebral disc displacement, lumbar region: Secondary | ICD-10-CM | POA: Diagnosis not present

## 2017-01-05 DIAGNOSIS — M5416 Radiculopathy, lumbar region: Secondary | ICD-10-CM | POA: Diagnosis not present

## 2017-01-05 DIAGNOSIS — M545 Low back pain: Secondary | ICD-10-CM | POA: Diagnosis not present

## 2017-01-06 DIAGNOSIS — J3089 Other allergic rhinitis: Secondary | ICD-10-CM | POA: Diagnosis not present

## 2017-01-10 IMAGING — NM NM MISC PROCEDURE
6 series · 36 of 36 positions shown · non-contrast
Comparison: none

[Series 1: wbr_r-proj_st rest · 6.40mm/px · 6 of 64 frames shown]
[frame 6/64]
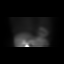
[frame 16/64]
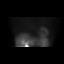
[frame 27/64]
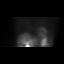
[frame 38/64]
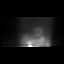
[frame 48/64]
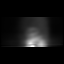
[frame 59/64]
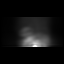

[Series 1: wbr_s-proj_st stress-gsp · 6.40mm/px · 6 of 512 frames shown]
[frame 43/512]
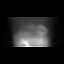
[frame 128/512]
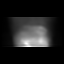
[frame 214/512]
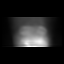
[frame 299/512]
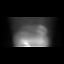
[frame 384/512]
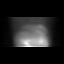
[frame 470/512]
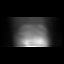

[Series 1: wbr_s-proj_st stress-sum-em · 6.40mm/px · 6 of 64 frames shown]
[frame 6/64]
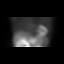
[frame 16/64]
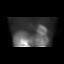
[frame 27/64]
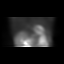
[frame 38/64]
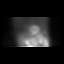
[frame 48/64]
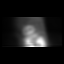
[frame 59/64]
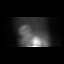

[Series 1: stress-sum-em · 6.40mm/px · 6 of 64 frames shown]
[frame 6/64]
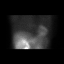
[frame 16/64]
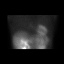
[frame 27/64]
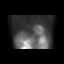
[frame 38/64]
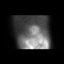
[frame 48/64]
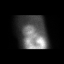
[frame 59/64]
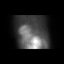

[Series 1: rest · 6.40mm/px · 6 of 64 frames shown]
[frame 6/64]
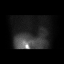
[frame 16/64]
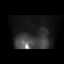
[frame 27/64]
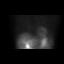
[frame 38/64]
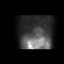
[frame 48/64]
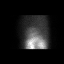
[frame 59/64]
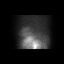

[Series 1: stress-gsp · 6.40mm/px · 6 of 511 frames shown]
[frame 43/511]
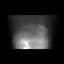
[frame 128/511]
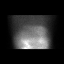
[frame 213/511]
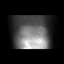
[frame 298/511]
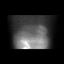
[frame 383/511]
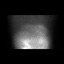
[frame 469/511]
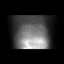

[36 of 36 positions shown; findings below may reference images not displayed]

Canned report from images found in remote index.

Refer to host system for actual result text.

## 2017-01-12 DIAGNOSIS — Z961 Presence of intraocular lens: Secondary | ICD-10-CM | POA: Diagnosis not present

## 2017-01-17 DIAGNOSIS — M545 Low back pain: Secondary | ICD-10-CM | POA: Diagnosis not present

## 2017-01-17 DIAGNOSIS — M5126 Other intervertebral disc displacement, lumbar region: Secondary | ICD-10-CM | POA: Diagnosis not present

## 2017-01-17 DIAGNOSIS — M5416 Radiculopathy, lumbar region: Secondary | ICD-10-CM | POA: Diagnosis not present

## 2017-01-18 DIAGNOSIS — J3089 Other allergic rhinitis: Secondary | ICD-10-CM | POA: Diagnosis not present

## 2017-01-23 DIAGNOSIS — M5416 Radiculopathy, lumbar region: Secondary | ICD-10-CM | POA: Diagnosis not present

## 2017-01-23 DIAGNOSIS — M5126 Other intervertebral disc displacement, lumbar region: Secondary | ICD-10-CM | POA: Diagnosis not present

## 2017-01-23 DIAGNOSIS — M545 Low back pain: Secondary | ICD-10-CM | POA: Diagnosis not present

## 2017-01-30 DIAGNOSIS — M545 Low back pain: Secondary | ICD-10-CM | POA: Diagnosis not present

## 2017-01-30 DIAGNOSIS — M5126 Other intervertebral disc displacement, lumbar region: Secondary | ICD-10-CM | POA: Diagnosis not present

## 2017-01-30 DIAGNOSIS — M5416 Radiculopathy, lumbar region: Secondary | ICD-10-CM | POA: Diagnosis not present

## 2017-02-01 DIAGNOSIS — M5416 Radiculopathy, lumbar region: Secondary | ICD-10-CM | POA: Diagnosis not present

## 2017-02-01 DIAGNOSIS — M545 Low back pain: Secondary | ICD-10-CM | POA: Diagnosis not present

## 2017-02-01 DIAGNOSIS — M5126 Other intervertebral disc displacement, lumbar region: Secondary | ICD-10-CM | POA: Diagnosis not present

## 2017-02-03 ENCOUNTER — Ambulatory Visit (INDEPENDENT_AMBULATORY_CARE_PROVIDER_SITE_OTHER): Payer: Medicare Other | Admitting: Physician Assistant

## 2017-02-03 ENCOUNTER — Encounter: Payer: Self-pay | Admitting: Physician Assistant

## 2017-02-03 VITALS — BP 112/62 | HR 64 | Ht 64.0 in | Wt 114.0 lb

## 2017-02-03 DIAGNOSIS — R6889 Other general symptoms and signs: Secondary | ICD-10-CM | POA: Diagnosis not present

## 2017-02-03 DIAGNOSIS — K219 Gastro-esophageal reflux disease without esophagitis: Secondary | ICD-10-CM | POA: Diagnosis not present

## 2017-02-03 DIAGNOSIS — R0989 Other specified symptoms and signs involving the circulatory and respiratory systems: Secondary | ICD-10-CM

## 2017-02-03 NOTE — Patient Instructions (Addendum)
If you are age 70 or older, your body mass index should be between 23-30. Your Body mass index is 19.57 kg/m. If this is out of the aforementioned range listed, please consider follow up with your Primary Care Provider.  If you are age 87 or younger, your body mass index should be between 19-25. Your Body mass index is 19.57 kg/m. If this is out of the aformentioned range listed, please consider follow up with your Primary Care Provider.   We have sent the following medications to your pharmacy for you to pick up at your convenience: Protonix 40 mg 1 every morning.  You will be due for a recall colonoscopy in Fall 2018. We will send you a reminder in the mail when it gets closer to that time.   Food Choices for Gastroesophageal Reflux Disease, Adult When you have gastroesophageal reflux disease (GERD), the foods you eat and your eating habits are very important. Choosing the right foods can help ease your discomfort. What guidelines do I need to follow?  Choose fruits, vegetables, whole grains, and low-fat dairy products.  Choose low-fat meat, fish, and poultry.  Limit fats such as oils, salad dressings, butter, nuts, and avocado.  Keep a food diary. This helps you identify foods that cause symptoms.  Avoid foods that cause symptoms. These may be different for everyone.  Eat small meals often instead of 3 large meals a day.  Eat your meals slowly, in a place where you are relaxed.  Limit fried foods.  Cook foods using methods other than frying.  Avoid drinking alcohol.  Avoid drinking large amounts of liquids with your meals.  Avoid bending over or lying down until 2-3 hours after eating. What foods are not recommended? These are some foods and drinks that may make your symptoms worse: Vegetables  Tomatoes. Tomato juice. Tomato and spaghetti sauce. Chili peppers. Onion and garlic. Horseradish. Fruits  Oranges, grapefruit, and lemon (fruit and juice). Meats  High-fat  meats, fish, and poultry. This includes hot dogs, ribs, ham, sausage, salami, and bacon. Dairy  Whole milk and chocolate milk. Sour cream. Cream. Butter. Ice cream. Cream cheese. Drinks  Coffee and tea. Bubbly (carbonated) drinks or energy drinks. Condiments  Hot sauce. Barbecue sauce. Sweets/Desserts  Chocolate and cocoa. Donuts. Peppermint and spearmint. Fats and Oils  High-fat foods. This includes [place] fries and potato chips. Other  Vinegar. Strong spices. This includes black pepper, white pepper, red pepper, cayenne, curry powder, cloves, ginger, and chili powder. The items listed above may not be a complete list of foods and drinks to avoid. Contact your dietitian for more information.  This information is not intended to replace advice given to you by your health care provider. Make sure you discuss any questions you have with your health care provider. Document Released: 05/29/2012 Document Revised: 05/05/2016 Document Reviewed: 10/02/2013 Elsevier Interactive Patient Education  2017 [place] you for choosing  GI

## 2017-02-03 NOTE — Progress Notes (Signed)
Subjective:    Patient ID: Madison Kramer, female    DOB: 1945-12-16, 71 y.o.   MRN: [id number]  HPI Erlean is a pleasant 71 year old white female known to Dr.Perry with history of GERD, IBS and hypertension. She was last seen in our office in 2016. Last EGD was done in December 2013 which was normal She had colonoscopy in October 2008 per Dr. Sammuel Cooper with finding of left-sided diverticulosis and no polyps. She has not been on chronic PPI therapy though had taken Dexilant for a while several years ago. She comes in today because she is concerned that all of her upper respiratory symptoms recently are associated with acid reflux. She says that she knows she is a hypo-contracted but she worries about esophageal cancer also. She has been Googling information about "why you're cold is not a cold" and says that she feels her current symptoms are related to acid reflux. She has chronic allergies for which she gets allergy injections. She says she became ill in November and upper respiratory infection and had to take a couple of courses of antibiotics. He says her symptoms finally cleared and then in January she started again having sinus congestion sneezing, drainage and then. Frequent clearing of her throat and increased mucus in her throat. She does not have any regular heartburn or indigestion. Denies any nighttime awakening with coughing ,strangling or sour brash. She does not have any daytime symptoms of sour brash. No dysphagia or odynophagia. She is trying to alter her diet, avoiding fatty foods and acidic foods and has stopped drinking wine at night but continues to drink a martini every night.  Review of Systems Pertinent positive and negative review of systems were noted in the above HPI section.  All other review of systems was otherwise negative.  Outpatient Encounter Prescriptions as of 02/03/2017  Medication Sig  . atorvastatin (LIPITOR) 40 MG tablet TAKE 1 TABLET ONCE DAILY AT 6 PM.  .  Calcium Carbonate-Vitamin D (CALCIUM + D PO) Take 2 tablets by mouth daily.   . Cholecalciferol (VITAMIN D PO) Take 1 tablet by mouth daily. PATIENT NOT SURE OF DOSAGE  . fluticasone (FLONASE) 50 MCG/ACT nasal spray Place 1 spray into both nostrils daily.  Marland Kitchen losartan (COZAAR) 50 MG tablet TAKE 1 TABLET ONCE DAILY.  . Magnesium 250 MG TABS Take 250 mg by mouth daily.  . Multiple Vitamin (MULTIVITAMIN) capsule Take 1 capsule by mouth daily.    Marland Kitchen OVER THE COUNTER MEDICATION Tumeric  . Probiotic Product (PROBIOTIC DAILY PO) Take 1 tablet by mouth daily.  . traZODone (DESYREL) 50 MG tablet Take 25 mg by mouth at bedtime.   No facility-administered encounter medications on file as of 02/03/2017.    Allergies  Allergen Reactions  . Codeine Nausea Only  . Erythromycin Other (See Comments)    Stomach cramping  . Sulfa Antibiotics     As a child  . Sulfamethoxazole Other (See Comments)    Unsdure of reaction. Had as a child.  . Sulfonamide Derivatives   . Tetracyclines & Related     unknown  . Tramadol Nausea Only and Other (See Comments)    Dizziness and mind racing   Patient Active Problem List   Diagnosis Date Noted  . 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 Bloating 08/12/2014  . Irritable bowel syndrome with diarrhea 08/12/2014  . Chest pain 03/30/2014  . Hypertension   . Back pain   . Extrinsic asthma 06/16/2012  . Lichen sclerosus et atrophicus  of the vulva   . Osteopenia   . Atrophic vaginitis   . CHRONIC RHINITIS 11/23/2009  . PULMONARY INFILTRATE INCLUDES (EOSINOPHILIA) 10/27/2009  . BRONCHITIS, ACUTE 10/23/2009  . Hyperlipidemia 12/22/2008  . Cough 12/22/2008  . HEMOPTYSIS 12/22/2008   Social History   Social History  . Marital status: Divorced    Spouse name: N/A  . Number of children: 2  . 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 Smoking status: Former Smoker    Years: 20.00    Quit date: 12/12/1988    . Smokeless tobacco: Never Used     Comment: "random smoker"  . Alcohol use 4.2 oz/week    7 Standard drinks or equivalent per week     Comment:    . Drug use: No  . Sexual activity: No     Comment: 1st intercourse 71 yo-Fewer than 5 partners   Other Topics Concern  . Not on file   Social History Narrative  . No narrative on file    Ms. Ijames's family history includes Breast cancer in her maternal aunt; Diabetes in her mother; Heart disease in her maternal uncle, mother, and paternal grandfather; Hypertension in her mother; Multiple myeloma in her father.      Objective:    Vitals:   02/03/17 1354  BP: 112/62  Pulse: 64    Physical Exam  well-developed older white female in no acute distress, pleasant, blood pressure 112/62 pulse 64, height 5 foot 4, weight 114, BMI 19.5. HEENT; nontraumatic normocephalic EOMI PERRLA sclera anicteric, Cardiovascular; regular rate and rhythm with S1-S2 no murmur or gallop, Pulmonary; clear bilaterally, Abdomen ;soft flat bowel sounds are present nontender there is no palpable mass or hepatosplenomegaly am not done, Ext; no clubbing cyanosis or edema skin warm and dry, Neuropsych; mood and affect appropriate       Assessment & Plan:   #28 71 year old white female with history of GERD presenting with persistent throat clearing, it increased mucus in her throat, sinus congestion and sneezing I'm not convinced that all of her symptoms are secondary to acid reflux though it certainly may come explain a component of her symptoms. #2 chronic environmental allergies #3 diverticulosis #4 colon cancer surveillance-last colonoscopy October 2008-will be due for follow-up fall 2018  Plan; we will start a trial of strict antireflux regimen, antireflux diet Start Protonix 40 mg by mouth every morning and plan an 8 week course and then reassess. She will follow up with Dr.Perry or myself in 8 weeks. She does not want to stay on long-term PPI therapy  unless absolutely necessary. We will plan follow-up colonoscopy fall 2018 with Dr. Henrene Pastor.  Amy S Esterwood PA-C 02/03/2017   Cc: [name], MD

## 2017-02-08 DIAGNOSIS — F4322 Adjustment disorder with anxiety: Secondary | ICD-10-CM | POA: Diagnosis not present

## 2017-02-13 DIAGNOSIS — J3089 Other allergic rhinitis: Secondary | ICD-10-CM | POA: Diagnosis not present

## 2017-02-13 NOTE — Progress Notes (Signed)
Agree with initial assessment and plans. Seen previously for similar atypical pharyngeal complaints

## 2017-02-16 DIAGNOSIS — J3089 Other allergic rhinitis: Secondary | ICD-10-CM | POA: Diagnosis not present

## 2017-02-21 DIAGNOSIS — J3089 Other allergic rhinitis: Secondary | ICD-10-CM | POA: Diagnosis not present

## 2017-02-23 ENCOUNTER — Other Ambulatory Visit: Payer: Self-pay | Admitting: *Deleted

## 2017-02-23 MED ORDER — ATORVASTATIN CALCIUM 40 MG PO TABS: ORAL_TABLET | ORAL | 0 refills | Status: DC

## 2017-02-28 DIAGNOSIS — M5126 Other intervertebral disc displacement, lumbar region: Secondary | ICD-10-CM | POA: Diagnosis not present

## 2017-02-28 DIAGNOSIS — M545 Low back pain: Secondary | ICD-10-CM | POA: Diagnosis not present

## 2017-02-28 DIAGNOSIS — M5416 Radiculopathy, lumbar region: Secondary | ICD-10-CM | POA: Diagnosis not present

## 2017-02-28 DIAGNOSIS — J3089 Other allergic rhinitis: Secondary | ICD-10-CM | POA: Diagnosis not present

## 2017-03-03 DIAGNOSIS — J3089 Other allergic rhinitis: Secondary | ICD-10-CM | POA: Diagnosis not present

## 2017-03-07 DIAGNOSIS — M5416 Radiculopathy, lumbar region: Secondary | ICD-10-CM | POA: Diagnosis not present

## 2017-03-07 DIAGNOSIS — M5126 Other intervertebral disc displacement, lumbar region: Secondary | ICD-10-CM | POA: Diagnosis not present

## 2017-03-07 DIAGNOSIS — M545 Low back pain: Secondary | ICD-10-CM | POA: Diagnosis not present

## 2017-03-16 DIAGNOSIS — M5416 Radiculopathy, lumbar region: Secondary | ICD-10-CM | POA: Diagnosis not present

## 2017-03-16 DIAGNOSIS — M5126 Other intervertebral disc displacement, lumbar region: Secondary | ICD-10-CM | POA: Diagnosis not present

## 2017-03-16 DIAGNOSIS — M545 Low back pain: Secondary | ICD-10-CM | POA: Diagnosis not present

## 2017-03-27 DIAGNOSIS — M5126 Other intervertebral disc displacement, lumbar region: Secondary | ICD-10-CM | POA: Diagnosis not present

## 2017-03-27 DIAGNOSIS — M5416 Radiculopathy, lumbar region: Secondary | ICD-10-CM | POA: Diagnosis not present

## 2017-03-27 DIAGNOSIS — M545 Low back pain: Secondary | ICD-10-CM | POA: Diagnosis not present

## 2017-04-03 ENCOUNTER — Encounter: Payer: Self-pay | Admitting: Cardiology

## 2017-04-03 ENCOUNTER — Telehealth: Payer: Self-pay | Admitting: Cardiology

## 2017-04-03 DIAGNOSIS — J3089 Other allergic rhinitis: Secondary | ICD-10-CM | POA: Diagnosis not present

## 2017-04-03 NOTE — Telephone Encounter (Signed)
New Message     Pt states she is not feeling well, has sob that comes and goes, wants to be checked out before she goes out of town set her up appt for 04/04/17 Ellen Henri 930a ,

## 2017-04-03 NOTE — Telephone Encounter (Signed)
Pt calling in with no cardiac complaints at all.  Pt calling in because she is frustrated that she can't see a MD in our office at the time she wants to see one, since Dr Madison Kramer left for CHF clinic.  Pt states that she doesn't understand why she can't see Dr Madison Kramer this week, prior too going out of town for her daughter's wedding.  Pt states she has no new issues with her cardiac status.  Scheduling arranged an appt to see Madison Henri PA-C for tomorrow at 0930.  Informed the pt that [name] is on Dr [employer] care team, and is very thorough with her pts.  Informed the pt that unfortunately Dr Madison Kramer is working in the hospital all this week, for that's why she will be seeing a PA on her care team instead.  Reassured the pt.  Pt verbalized understanding and agrees with this plan.  Pt gracious for all the assistance provided.

## 2017-04-04 ENCOUNTER — Ambulatory Visit (INDEPENDENT_AMBULATORY_CARE_PROVIDER_SITE_OTHER): Payer: Medicare Other | Admitting: Cardiology

## 2017-04-04 ENCOUNTER — Encounter: Payer: Self-pay | Admitting: Cardiology

## 2017-04-04 ENCOUNTER — Encounter: Payer: Self-pay | Admitting: Internal Medicine

## 2017-04-04 ENCOUNTER — Ambulatory Visit (INDEPENDENT_AMBULATORY_CARE_PROVIDER_SITE_OTHER): Payer: Medicare Other | Admitting: Internal Medicine

## 2017-04-04 VITALS — BP 116/64 | HR 64 | Ht 64.0 in | Wt 111.0 lb

## 2017-04-04 VITALS — BP 110/58 | HR 70 | Ht 63.5 in | Wt 112.0 lb

## 2017-04-04 DIAGNOSIS — R0600 Dyspnea, unspecified: Secondary | ICD-10-CM | POA: Diagnosis not present

## 2017-04-04 DIAGNOSIS — F411 Generalized anxiety disorder: Secondary | ICD-10-CM | POA: Diagnosis not present

## 2017-04-04 DIAGNOSIS — R6889 Other general symptoms and signs: Secondary | ICD-10-CM | POA: Diagnosis not present

## 2017-04-04 DIAGNOSIS — K219 Gastro-esophageal reflux disease without esophagitis: Secondary | ICD-10-CM

## 2017-04-04 DIAGNOSIS — R140 Abdominal distension (gaseous): Secondary | ICD-10-CM

## 2017-04-04 DIAGNOSIS — R0989 Other specified symptoms and signs involving the circulatory and respiratory systems: Secondary | ICD-10-CM

## 2017-04-04 MED ORDER — PANTOPRAZOLE SODIUM 40 MG PO TBEC: 40.0000 mg | DELAYED_RELEASE_TABLET | Freq: Every day | ORAL | 11 refills | Status: DC

## 2017-04-04 MED ORDER — LOSARTAN POTASSIUM 50 MG PO TABS: 50.0000 mg | ORAL_TABLET | Freq: Every day | ORAL | 3 refills | Status: DC

## 2017-04-04 NOTE — Progress Notes (Signed)
HISTORY OF PRESENT ILLNESS:  Madison Kramer is a 71 y.o. female with past medical history as listed below. Seen in this office for GERD, chronic bloating with IBS, issues with throat clearing behavior of uncertain etiology, health related anxiety. I last saw the patient December 2016 for alternating bowel habits with bloating, well-controlled GERD, and health related anxiety. See that dictation. Previous upper endoscopy for pharyngeal complaints December 2013 was normal. He uses PPI off and on. Prior colonoscopy elsewhere October 2008 with diverticulosis. Recently seen by the GI physician assistant 02/03/2017 regarding persistent throat clearing of uncertain cause and the need for colon cancer screening. She presents today for follow-up. She was placed on pantoprazole 40 mg daily. She states that her throat clearing issues have improved. She continues with bloating and has questions in this regard. Beans are problematic. No dysphagia. She inquires about follow-up screening colonoscopy anniversary date.  REVIEW OF SYSTEMS:  All non-GI ROS negative except for sinus and allergy trouble, anxiety  Past Medical History:  Diagnosis Date  . Allergic rhinitis   . Anxiety   . Arthritis   . Asthma   . Atypical chest pain    Possibly from DES  . Back pain   . Cataract    Hx; of  . Diverticulosis    Left  . Elevated cholesterol   . GERD (gastroesophageal reflux disease)   . Hemoptysis 2007   From reflux pharyngitis  . History of nuclear stress test    Myoview 5/17: EF 70%, no scar or ischemia, excellent exercise capacity, low risk  . Hypertension   . Insomnia   . Lactose intolerance   . Lichen sclerosus et atrophicus of the vulva 2008   Biopsy-proven  . Nocturia   . Osteoporosis 10/2015   T score -2.5 left femoral neck  . Pneumonia   . Raynaud's syndrome    "possible"    Past Surgical History:  Procedure Laterality Date  . BREAST SURGERY  2001   REDUCTION MAMMAPLASTY  . COLONOSCOPY     . FOOT SURGERY  2009  . HYSTEROSCOPY  2003   HYSTEROSCOPIC RESECTION OF MYOMA  . INGUINAL HERNIA REPAIR  1971   BILATERAL  . LUMBAR LAMINECTOMY/DECOMPRESSION MICRODISCECTOMY Right 08/30/2013   Procedure: Right Lumbar four-five Extraforaminal Diskectomy;  Surgeon: Hosie Spangle, MD;  Location: [facility];  Service: Neurosurgery;  Laterality: Right;  . MANDIBLE SURGERY    . MYOMECTOMY    . TONSILLECTOMY    . TUBAL LIGATION  1987    Social History MERINDA VICTORINO  reports that she quit smoking about 28 years ago. She quit after 20.00 years of use. She has never used smokeless tobacco. She reports that she drinks about 4.2 oz of alcohol per week . She reports that she does not use drugs.  family history includes Breast cancer in her maternal aunt; Diabetes in her mother; Heart disease in her maternal uncle, mother, and paternal grandfather; Hypertension in her mother; Multiple myeloma in her father.  Allergies  Allergen Reactions  . Codeine Nausea Only  . Erythromycin Other (See Comments)    Stomach cramping  . Sulfa Antibiotics     As a child  . Sulfamethoxazole Other (See Comments)    Unsdure of reaction. Had as a child.  . Sulfonamide Derivatives   . Tetracyclines & Related     unknown  . Tramadol Nausea Only and Other (See Comments)    Dizziness and mind racing       PHYSICAL  EXAMINATION: Vital signs: BP (!) 110/58   Pulse 70   Ht 5' 3.5" (1.613 m) Comment: measured without shoes  Wt 112 lb (50.8 kg)   BMI 19.53 kg/m   Constitutional: generally well-appearing, no acute distress Psychiatric: alert and oriented x3, cooperative Eyes: extraocular movements intact, anicteric, conjunctiva pink Mouth: oral pharynx moist, no lesions Neck: supple no lymphadenopathy Cardiovascular: heart regular rate and rhythm, no murmur Lungs: clear to auscultation bilaterally Abdomen: soft, nontender, nondistended, no obvious ascites, no peritoneal signs, normal bowel sounds, no  organomegaly Rectal:Omitted Extremities: no clubbing cyanosis or lower extremity edema bilaterally Skin: no lesions on visible extremities Neuro: No focal deficits. Normal DTRs  ASSESSMENT:  #1. Chronic intermittent throat clearing behavior. Uncertain etiology. Seemingly improved with course of PPI. She has questions and concerns over long term PPI use. #2. GERD. No classic symptoms #3. Normal EGD 2013 #4. IBS/bloating. Ongoing #5. Diverticulosis on colonoscopy October 2008 #6. Health related anxiety  PLAN:  #1. Discontinue PPI. May use on demand if needed. Prescription with refills provided #2. Anti-gas and flatulence dietary sheet #3. Reassurance #4. Surveillance colonoscopy around October 2018. Patient aware  15 minutes spent face-to-face with the patient. The majority of the time spent counseling regarding her issues with throat clearing, PPI use, bloating, and follow-up colonoscopy

## 2017-04-04 NOTE — Patient Instructions (Signed)
We have sent the following medications to your pharmacy for you to pick up at your convenience:  Omeprazole  You have a recall colonoscopy scheduled for October 2018

## 2017-04-04 NOTE — Progress Notes (Signed)
04/04/2017 Madison Kramer   Apr 10, 1946  [id number]  Primary Physician Irven Shelling, MD Primary Cardiologist: Dr. Meda Coffee (formerly Dr. Aundra Dubin)  Reason for Visit/CC: Dyspnea   HPI:  Madison Kramer is a 71 y.o. female who is being seen today for the evaluation of dyspnea. She is a former patient of Dr. Claris Gladden who has now been assigned to Dr. Meda Coffee. Her PMH is notable for atypical chest pain and palpitations. She has undergone ischemic w/u. She had a Cardiolite NST in 3/11 that was normal and had a repeat study 5/17 that was also normal with no ischemia of infarction. 2D echo in 2014 was normal with normal LVEF. She also has treated HTN and HLD. She is on losartan and Lipitor. She is very active for her age. She still works. She is a Forensic psychologist. Also exercises daily (swimming, treadmill, stair climber, elliptical and light weight training). She also notes chronic allergies (dust mites and mold). 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  As noticed mild dyspnea with certain activities. She notes that she sometimes get short of breath if she is walking very quickly while trying to talk at the same time. She also feels heavy mucus buildup in her throat when this happens. She thinks this may be related to her allergies. Interestingly, she denies any similar symptoms when she's at the gym on the treadmill, stair climber or elliptical machine. She also denies any associated exertional chest discomfort. She admits that she thinks a lot of this may also be anxiety related and mainly just wants reassurance that she doesn't need to do anything additional from a cardiac standpoint.  EKG shows possible ectopic atrial rhythm. Heart rate 62 bpm. No ischemia.  No outpatient prescriptions have been marked as taking for the 04/04/17 encounter (Office Visit) with Consuelo Pandy, PA-C.   Allergies  Allergen Reactions  . Codeine Nausea Only  .  Erythromycin Other (See Comments)    Stomach cramping  . Sulfa Antibiotics     As a child  . Sulfamethoxazole Other (See Comments)    Unsdure of reaction. Had as a child.  . Sulfonamide Derivatives   . Tetracyclines & Related     unknown  . Tramadol Nausea Only and Other (See Comments)    Dizziness and mind racing   Past Medical History:  Diagnosis Date  . Allergic rhinitis   . Anxiety   . Arthritis   . Asthma   . Atypical chest pain    Possibly from DES  . Back pain   . Cataract    Hx; of  . Diverticulosis    Left  . Elevated cholesterol   . GERD (gastroesophageal reflux disease)   . Hemoptysis 2007   From reflux pharyngitis  . History of nuclear stress test    Myoview 5/17: EF 70%, no scar or ischemia, excellent exercise capacity, low risk  . Hypertension   . Insomnia   . Lactose intolerance   . Lichen sclerosus et atrophicus of the vulva 2008   Biopsy-proven  . Nocturia   . Osteoporosis 10/2015   T score -2.5 left femoral neck  . Pneumonia   . Raynaud's syndrome    "possible"   Family History  Problem Relation Age of Onset  . Heart disease Mother     died at 67; ? MI  . Hypertension Mother   . Diabetes Mother   . Multiple myeloma Father   .  Heart disease Paternal Grandfather     age 59 - died of MI  . Breast cancer Maternal Aunt     Age 22's  . Heart disease Maternal Uncle    Past Surgical History:  Procedure Laterality Date  . BREAST SURGERY  2001   REDUCTION MAMMAPLASTY  . COLONOSCOPY    . FOOT SURGERY  2009  . HYSTEROSCOPY  2003   HYSTEROSCOPIC RESECTION OF MYOMA  . INGUINAL HERNIA REPAIR  1971   BILATERAL  . LUMBAR LAMINECTOMY/DECOMPRESSION MICRODISCECTOMY Right 08/30/2013   Procedure: Right Lumbar four-five Extraforaminal Diskectomy;  Surgeon: Hosie Spangle, MD;  Location: [facility];  Service: Neurosurgery;  Laterality: Right;  . MANDIBLE SURGERY    . MYOMECTOMY    . TONSILLECTOMY    . TUBAL LIGATION  1987   Social History    Social History  . Marital status: Divorced    Spouse name: N/A  . Number of children: 2  . 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 Smoking status: Former Smoker    Years: 20.00    Quit date: 12/12/1988  . Smokeless tobacco: Never Used     Comment: "random smoker"  . Alcohol use 4.2 oz/week    7 Standard drinks or equivalent per week     Comment:    . Drug use: No  . Sexual activity: No     Comment: 1st intercourse 71 yo-Fewer than 5 partners   Other Topics Concern  . Not on file   Social History Narrative  . No narrative on file     Review of Systems: General: negative for chills, fever, night sweats or weight changes.  Cardiovascular: negative for chest pain, dyspnea on exertion, edema, orthopnea, palpitations, paroxysmal nocturnal dyspnea or shortness of breath Dermatological: negative for rash Respiratory: negative for cough or wheezing Urologic: negative for hematuria Abdominal: negative for nausea, vomiting, diarrhea, bright red blood per rectum, melena, or hematemesis Neurologic: negative for visual changes, syncope, or dizziness All other systems reviewed and are otherwise negative except as noted above.   Physical Exam:  Height _0  (1.626 m), weight 111 lb (50.3 kg).  General appearance: alert, cooperative, no distress and slighlty anxious Neck: no carotid bruit and no JVD Lungs: clear to auscultation bilaterally Heart: regular rate and rhythm, S1, S2 normal, no murmur, click, rub or gallop Extremities: extremities normal, atraumatic, no cyanosis or edema Pulses: 2+ and symmetric Skin: Skin color, texture, turgor normal. No rashes or lesions Neurologic: Grossly normal  EKG  ectopic atrial rhythm. Heart rate 62 bpm -- personally reviewed   ASSESSMENT AND PLAN:   Patient notes mild dyspnea when speed walking and talking to her walking partner at the same time. When this occurs she also notes  the sensation of thick mucus in her throat. She is  bothered by allergies. Her dyspnea goes away with rest. Interestingly, she has no similar symptoms when she's inside exercising at the gym. She is very active on the treadmill, elliptical and stair climber. No exertional dyspnea or chest pain with these activities. She had a recent normal nuclear stress test last year. Her EKG shows possible ectopic atrial rhythm, also seen on prior EKGs. Otherwise no ischemic abnormalities. Her cardiac risk factors including hypertension and hyperlipidemia are treated and well-controlled. I do not believe her current symptoms are cardiac related. Patient was given reassurance. I do not see any indication for repeat ischemic testing  at this time. As ordered by Dr. Aundra Dubin, she is to follow-up with Dr. Meda Coffee later this year in August. If she were to later develop any symptoms concerning for ischemia, may consider cardiac CTA with calcium score in this patient.   Kaula Klenke Ladoris Gene, MHS [facility] 04/04/2017 9:26 AM

## 2017-04-04 NOTE — Patient Instructions (Signed)
Medication Instructions:  None Ordered   Labwork: None Ordered   Testing/Procedures: None Ordered   Follow-Up: Your physician recommends that you schedule a follow-up appointment in: 3 months with Dr. Meda Coffee.   Any Other Special Instructions Will Be Listed Below (If Applicable).     If you need a refill on your cardiac medications before your next appointment, please call your pharmacy.

## 2017-04-14 ENCOUNTER — Encounter: Payer: Self-pay | Admitting: Gynecology

## 2017-04-14 ENCOUNTER — Ambulatory Visit (INDEPENDENT_AMBULATORY_CARE_PROVIDER_SITE_OTHER): Payer: Medicare Other | Admitting: Gynecology

## 2017-04-14 VITALS — BP 120/76 | Ht 64.0 in | Wt 111.0 lb

## 2017-04-14 DIAGNOSIS — Z01411 Encounter for gynecological examination (general) (routine) with abnormal findings: Secondary | ICD-10-CM | POA: Diagnosis not present

## 2017-04-14 DIAGNOSIS — M810 Age-related osteoporosis without current pathological fracture: Secondary | ICD-10-CM | POA: Insufficient documentation

## 2017-04-14 DIAGNOSIS — N952 Postmenopausal atrophic vaginitis: Secondary | ICD-10-CM

## 2017-04-14 NOTE — Progress Notes (Signed)
    Madison Kramer Oct 17, 1946 [id number]        71 y.o.  G2P2002 for breast and pelvic exam.  Past medical history,surgical history, problem list, medications, allergies, family history and social history were all reviewed and documented as reviewed in the EPIC chart.  ROS:  Performed with pertinent positives and negatives included in the history, assessment and plan.   Additional significant findings :  None   Exam: Caryn Bee assistant Vitals:   04/14/17 1126  BP: 120/76  Weight: 117 lb (53.1 kg)  Height: 5\' 4"  (1.626 m)   Body mass index is 20.08 kg/m.  General appearance:  Normal affect, orientation and appearance. Skin: Grossly normal HEENT: Without gross lesions.  No cervical or supraclavicular adenopathy. Thyroid normal.  Lungs:  Clear without wheezing, rales or rhonchi Cardiac: RR, without RMG Abdominal:  Soft, nontender, without masses, guarding, rebound, organomegaly or hernia Breasts:  Examined lying and sitting without masses, retractions, discharge or axillary adenopathy.With bilateral well-healed reduction scars. Pelvic:  Ext, BUS, Vagina: With atrophic changes  Cervix: With atrophic changes  Uterus: Anteverted, normal size, shape and contour, midline and mobile nontender   Adnexa: Without masses or tenderness    Anus and perineum: Normal   Rectovaginal: Normal sphincter tone without palpated masses or tenderness.    Assessment/Plan:  71 y.o. [id number] female for breast and pelvic exam.   1. Postmenopausal/atrophic genital changes. No significant hot flushes, night sweats, vaginal dryness or any vaginal bleeding. Continue monitor report any issues or bleeding. 2. Mammography 08/2016. Continue with annual mammography when due. History of early breast cancer in aunt. Jewish ancestry.  Had been offered genetic counseling on several occasions and declined. SBE monthly reviewed. 3. Colonoscopy coming do this year and she knows to call and schedule. 4. Pap smear 2016.  No Pap smear done today. No history of significant abnormal Pap smears. Options to stop screening per current screening guidelines based on age versus less frequent screening intervals reviewed. Will readdress on annual basis. 5. Osteoporosis. DEXA 2016 T score -2.5. Plans follow up DEXA at Dr. Delene Ruffini office this year and will follow up with them in reference to this and management. Had been on Fosamax for 5 years discontinued 2011. 6. Health maintenance. No routine lab work done as this is done elsewhere. Follow up 1 year, sooner as needed.   [name] MD, 12:02 PM 04/14/2017

## 2017-04-14 NOTE — Patient Instructions (Signed)

## 2017-04-25 DIAGNOSIS — M545 Low back pain: Secondary | ICD-10-CM | POA: Diagnosis not present

## 2017-04-25 DIAGNOSIS — M5126 Other intervertebral disc displacement, lumbar region: Secondary | ICD-10-CM | POA: Diagnosis not present

## 2017-04-25 DIAGNOSIS — M791 Myalgia: Secondary | ICD-10-CM | POA: Diagnosis not present

## 2017-04-25 DIAGNOSIS — M47817 Spondylosis without myelopathy or radiculopathy, lumbosacral region: Secondary | ICD-10-CM | POA: Diagnosis not present

## 2017-04-25 DIAGNOSIS — M5416 Radiculopathy, lumbar region: Secondary | ICD-10-CM | POA: Diagnosis not present

## 2017-04-26 DIAGNOSIS — F4322 Adjustment disorder with anxiety: Secondary | ICD-10-CM | POA: Diagnosis not present

## 2017-05-09 DIAGNOSIS — M5416 Radiculopathy, lumbar region: Secondary | ICD-10-CM | POA: Diagnosis not present

## 2017-05-09 DIAGNOSIS — J3089 Other allergic rhinitis: Secondary | ICD-10-CM | POA: Diagnosis not present

## 2017-05-09 DIAGNOSIS — M545 Low back pain: Secondary | ICD-10-CM | POA: Diagnosis not present

## 2017-05-09 DIAGNOSIS — M5126 Other intervertebral disc displacement, lumbar region: Secondary | ICD-10-CM | POA: Diagnosis not present

## 2017-05-18 ENCOUNTER — Telehealth: Payer: Self-pay | Admitting: Physician Assistant

## 2017-05-18 ENCOUNTER — Ambulatory Visit (INDEPENDENT_AMBULATORY_CARE_PROVIDER_SITE_OTHER): Payer: Medicare Other | Admitting: Physician Assistant

## 2017-05-18 ENCOUNTER — Encounter: Payer: Self-pay | Admitting: Physician Assistant

## 2017-05-18 VITALS — BP 118/68 | HR 72 | Ht 63.75 in | Wt 112.0 lb

## 2017-05-18 DIAGNOSIS — K219 Gastro-esophageal reflux disease without esophagitis: Secondary | ICD-10-CM

## 2017-05-18 DIAGNOSIS — R1084 Generalized abdominal pain: Secondary | ICD-10-CM | POA: Diagnosis not present

## 2017-05-18 DIAGNOSIS — Z1211 Encounter for screening for malignant neoplasm of colon: Secondary | ICD-10-CM | POA: Diagnosis not present

## 2017-05-18 DIAGNOSIS — K588 Other irritable bowel syndrome: Secondary | ICD-10-CM | POA: Diagnosis not present

## 2017-05-18 DIAGNOSIS — R140 Abdominal distension (gaseous): Secondary | ICD-10-CM

## 2017-05-18 DIAGNOSIS — L720 Epidermal cyst: Secondary | ICD-10-CM | POA: Diagnosis not present

## 2017-05-18 MED ORDER — NA SULFATE-K SULFATE-MG SULF 17.5-3.13-1.6 GM/177ML PO SOLN: ORAL | 0 refills | Status: DC

## 2017-05-18 MED ORDER — RIFAXIMIN 550 MG PO TABS: 550.0000 mg | ORAL_TABLET | Freq: Three times a day (TID) | ORAL | 0 refills | Status: AC

## 2017-05-18 MED ORDER — PANTOPRAZOLE SODIUM 40 MG PO TBEC: DELAYED_RELEASE_TABLET | ORAL | 3 refills | Status: DC

## 2017-05-18 NOTE — Patient Instructions (Signed)
We have sent the following medications to your pharmacy for you to pick up at your convenience: [facility] 1. Pantoprazole sodium 40 mg.  Also Amy suggests you take a probiotic called Align. You can get this a [employer] or [place], [employer]. [employer] or [employer].   Continue Miralax 17 grams daily. We sent the Xifaxan 550 mg to [facility]. They will be calling you regarding cost and mailing the medication to your home.   We have provided you with a FODMAP diet.  You have been scheduled for a colonoscopy. Please follow written instructions given to you at your visit today.  Please pick up your prep supplies at the pharmacy within the next 1-3 days. If you use inhalers (even only as needed), please bring them with you on the day of your procedure. Your physician has requested that you go to www.startemmi.com and enter the access code given to you at your visit today. This web site gives a general overview about your procedure. However, you should still follow specific instructions given to you by our office regarding your preparation for the procedure.

## 2017-05-18 NOTE — Progress Notes (Signed)
 Subjective:    Patient ID: Madison Kramer, female    DOB: 06/16/1946, 71 y.o.   MRN: [id number]  HPI  Madison Kramer is a pleasant 71-year-old white female known to Dr. He. She has history of GERD, and IBS, chronic bloating and diverticulosis. She was last seen in the office in April 2018 at that time with complaints of GERD and persistent throat clearing. She comes back in today with complaints of increased difficulty with IBS symptoms abdominal bloating discomfort and constipation. She had been asked to start Protonix 40 mg by mouth daily at the time of her last office visit but says she hasn't been taking that every day because she doesn't like taking medications. She thinks that it is helpful at times. She is having ongoing problems with what she describes as bad reflux and a lot of mucus in her throat which makes her feel like she needs to clear her throat repeatedly. She has no complaints of dysphagia. She states she's having a lot of problems with abdominal bloating and discomfort. She had an acute episode last week and after eating a Greek salad with prolonged abdominal bloating and distention and pain. So constipated. She started taking MiraLAX without much benefit and then finally took Dulcolax a couple of days later and all of her symptoms seem to settle down after she had a good evacuation of her bowels. She says she's been reading on line and has eliminated multiple foods from her diet and says she's very limited at this point. He has been taking align on a daily basis. She does admit to increasing stress recently. She would like help with her IBS symptoms and constipation. EGD had been done in 2013 and was normal Last colonoscopy 2008 pertinent for diverticulosis, no polyps  Review of Systems Pertinent positive and negative review of systems were noted in the above HPI section.  All other review of systems was otherwise negative.  Outpatient Encounter Prescriptions as of 05/18/2017  Medication  Sig  . atorvastatin (LIPITOR) 40 MG tablet TAKE 1 TABLET ONCE DAILY AT 6 PM.  . Calcium Carbonate-Vitamin D (CALCIUM + D PO) Take 2 tablets by mouth daily.   . Cholecalciferol (VITAMIN D PO) Take 1 tablet by mouth daily. PATIENT NOT SURE OF DOSAGE  . fluticasone (FLONASE) 50 MCG/ACT nasal spray Place 1 spray into both nostrils daily.  . losartan (COZAAR) 50 MG tablet Take 1 tablet (50 mg total) by mouth daily.  . Magnesium 250 MG TABS Take 250 mg by mouth daily.  . Multiple Vitamin (MULTIVITAMIN) capsule Take 1 capsule by mouth daily.    . pantoprazole (PROTONIX) 40 MG tablet Take 1 tablet by mouth every morning.  . Probiotic Product (PROBIOTIC DAILY PO) Take 1 tablet by mouth daily.  . traZODone (DESYREL) 50 MG tablet Take 25 mg by mouth at bedtime.  . TURMERIC PO Take 1 tablet by mouth daily.  . [DISCONTINUED] pantoprazole (PROTONIX) 40 MG tablet Take 1 tablet (40 mg total) by mouth daily.  . Na Sulfate-K Sulfate-Mg Sulf 17.5-3.13-1.6 GM/180ML SOLN Take as directed  . rifaximin (XIFAXAN) 550 MG TABS tablet Take 1 tablet (550 mg total) by mouth 3 (three) times daily.   No facility-administered encounter medications on file as of 05/18/2017.    Allergies  Allergen Reactions  . Codeine Nausea Only  . Erythromycin Other (See Comments)    Stomach cramping  . Sulfa Antibiotics     As a child  . Sulfamethoxazole Other (See Comments)      Unsdure of reaction. Had as a child.  . Sulfonamide Derivatives   . Tetracyclines & Related     unknown  . Tramadol Nausea Only and Other (See Comments)    Dizziness and mind racing   Patient Active Problem List   Diagnosis Date Noted  . Osteoporosis 04/14/2017  . 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 Irritable bowel syndrome with diarrhea 08/12/2014  . Hypertension   . Extrinsic asthma 06/16/2012  . Lichen sclerosus et atrophicus of the vulva   . CHRONIC RHINITIS 11/23/2009  . PULMONARY INFILTRATE INCLUDES (EOSINOPHILIA) 10/27/2009  . Hyperlipidemia  12/22/2008  . Cough 12/22/2008  . HEMOPTYSIS 12/22/2008   Social History   Social History  . Marital status: Divorced    Spouse name: N/A  . Number of children: 2  . Years of education: N/A   Occupational History  . REaltor [employer]   Social History Main Topics  . Smoking status: Former Smoker    Years: 20.00    Quit date: 12/12/1988  . Smokeless tobacco: Never Used  . Alcohol use 4.2 oz/week    7 Standard drinks or equivalent per week     Comment:    . Drug use: No  . Sexual activity: No     Comment: 1st intercourse 71 yo-Fewer than 5 partners   Other Topics Concern  . Not on file   Social History Narrative  . No narrative on file    Madison Kramer's family history includes Breast cancer in her maternal aunt; Diabetes in her mother; Heart disease in her maternal uncle, mother, and paternal grandfather; Hypertension in her mother; Multiple myeloma in her father.      Objective:    Vitals:   05/18/17 1046  BP: 118/68  Pulse: 72    Physical Exam  well-developed older white female in no acute distress, very pleasant blood pressure 118/68 pulse 72, height 5 foot 3, weight 112, BMI of 19.3. HEENT; nontraumatic normocephalic EOMI PERRLA sclera anicteric, Cardiovascular; regular rate and rhythm with S1-S2 no murmur or gallop, Pulmonary; clear bilaterally, Abdomen ;soft, no focal tenderness no guarding or rebound no palpable mass or hepatosplenomegaly, Rectal; exam not done, Ext; no clubbing cyanosis or edema skin warm and dry, Neuropsych ;mood and affect appropriate       Assessment & Plan:   #1 71-year-old white female with chronic GERD, currently poorly controlled and also bothered by persistent need for throat clearing which may be partly related to GERD and also secondary to postnasal drainage #2 IBS with recent increase in abdominal bloating discomfort and constipation #3 diverticulosis #4  colon cancer surveillance-due for follow-up colonoscopy this year #5  hypertension #6 hyperlipidemia  Plan;; long discussion today with the patient regarding GERD, throat clearing and management of her IBS symptoms. Greater than 50% of visit spent in discussion and counseling. We will start Protonix 40 mg by mouth every morning Continue strict anti-reflux regimen and elevation of the head of the bed We'll give her a course of Xifaxan 550 mg by mouth 3 times a day 14 days for IBS/bacterial overgrowth And teeny align 1 by mouth daily Continue MiraLAX 17 g in 8 ounces of water daily. We also discussed adding dried apricots takes her prunes to her diet daily and liberal water intake Discussed FODMAP diet. She is already been reading about this on line and has eliminated multiple foods. Stressed the importance of finding her trigger groups of foods and avoiding these while reintroducing other well-tolerated foods. Eliminate artificial sweeteners   Will schedule for colonoscopy with Dr. Henrene Pastor. Procedure discussed in detail with patient including risks benefits and she is agreeable to proceed. Can also consider trial of  Linzess in future -will wait to see if has good response to Xifaxan.  Amy S Esterwood PA-C 05/18/2017   Cc: [name], MD

## 2017-05-18 NOTE — Progress Notes (Signed)
Initial assessment and plans review 

## 2017-05-22 ENCOUNTER — Telehealth: Payer: Self-pay | Admitting: Physician Assistant

## 2017-05-22 NOTE — Telephone Encounter (Signed)
[facility] calling about the Xifaxan.  They have some clinical questions.  #[phone]

## 2017-05-22 NOTE — Telephone Encounter (Signed)
Faxed office note from 05-18-2017 to [facility], fax # [phone].

## 2017-05-22 NOTE — Telephone Encounter (Signed)
I called the patient and she had not started the Xifaxan samples. She is flying out of town on Friday 6-15.  I told her to start the Xifaxan 500 mg, now and I will call her after I talk to [facility] to see if they approved the medication.  I may have to give her samples.

## 2017-05-22 NOTE — Telephone Encounter (Signed)
[facility] had said the PA was approved but her price or copay would be 293.79.  The patient said she could no afford that .   Advised [facility] and we were able to give the patient 24 more tablets. She will have enough for the full course of 42 tablets.  I advised them that the patient got samples and we do not need the prescription through them.

## 2017-05-23 DIAGNOSIS — M47817 Spondylosis without myelopathy or radiculopathy, lumbosacral region: Secondary | ICD-10-CM | POA: Diagnosis not present

## 2017-05-23 DIAGNOSIS — M545 Low back pain: Secondary | ICD-10-CM | POA: Diagnosis not present

## 2017-05-23 DIAGNOSIS — M791 Myalgia: Secondary | ICD-10-CM | POA: Diagnosis not present

## 2017-05-24 DIAGNOSIS — M5126 Other intervertebral disc displacement, lumbar region: Secondary | ICD-10-CM | POA: Diagnosis not present

## 2017-05-24 DIAGNOSIS — M545 Low back pain: Secondary | ICD-10-CM | POA: Diagnosis not present

## 2017-05-24 DIAGNOSIS — M5416 Radiculopathy, lumbar region: Secondary | ICD-10-CM | POA: Diagnosis not present

## 2017-05-29 DIAGNOSIS — M5126 Other intervertebral disc displacement, lumbar region: Secondary | ICD-10-CM | POA: Diagnosis not present

## 2017-05-29 DIAGNOSIS — M5416 Radiculopathy, lumbar region: Secondary | ICD-10-CM | POA: Diagnosis not present

## 2017-05-29 DIAGNOSIS — M545 Low back pain: Secondary | ICD-10-CM | POA: Diagnosis not present

## 2017-06-05 DIAGNOSIS — M545 Low back pain: Secondary | ICD-10-CM | POA: Diagnosis not present

## 2017-06-05 DIAGNOSIS — M5126 Other intervertebral disc displacement, lumbar region: Secondary | ICD-10-CM | POA: Diagnosis not present

## 2017-06-05 DIAGNOSIS — M5416 Radiculopathy, lumbar region: Secondary | ICD-10-CM | POA: Diagnosis not present

## 2017-06-12 DIAGNOSIS — J3089 Other allergic rhinitis: Secondary | ICD-10-CM | POA: Diagnosis not present

## 2017-06-26 DIAGNOSIS — K219 Gastro-esophageal reflux disease without esophagitis: Secondary | ICD-10-CM | POA: Diagnosis not present

## 2017-06-26 DIAGNOSIS — J3089 Other allergic rhinitis: Secondary | ICD-10-CM | POA: Diagnosis not present

## 2017-06-26 DIAGNOSIS — J4530 Mild persistent asthma, uncomplicated: Secondary | ICD-10-CM | POA: Diagnosis not present

## 2017-06-26 DIAGNOSIS — H1045 Other chronic allergic conjunctivitis: Secondary | ICD-10-CM | POA: Diagnosis not present

## 2017-06-30 ENCOUNTER — Ambulatory Visit (INDEPENDENT_AMBULATORY_CARE_PROVIDER_SITE_OTHER): Payer: Medicare Other | Admitting: Cardiology

## 2017-06-30 ENCOUNTER — Encounter: Payer: Self-pay | Admitting: Cardiology

## 2017-06-30 VITALS — BP 122/62 | HR 57 | Ht 63.75 in | Wt 110.0 lb

## 2017-06-30 DIAGNOSIS — R0600 Dyspnea, unspecified: Secondary | ICD-10-CM | POA: Diagnosis not present

## 2017-06-30 DIAGNOSIS — E782 Mixed hyperlipidemia: Secondary | ICD-10-CM | POA: Diagnosis not present

## 2017-06-30 DIAGNOSIS — I10 Essential (primary) hypertension: Secondary | ICD-10-CM

## 2017-06-30 MED ORDER — LOSARTAN POTASSIUM 25 MG PO TABS: 25.0000 mg | ORAL_TABLET | Freq: Every day | ORAL | 3 refills | Status: DC

## 2017-06-30 NOTE — Addendum Note (Signed)
Addended by: Nuala Alpha on: 06/30/2017 10:55 AM   Modules accepted: Orders

## 2017-06-30 NOTE — Patient Instructions (Signed)
Medication Instructions:   DECREASE YOUR LOSARTAN TO 25 MG ONCE DAILY    Labwork:  NEXT Monday 07/03/17 AT OUR OFFICE TO CHECK--CMET, CBC W DIFF, TSH, AND LIPIDS---PLEASE COME FASTING TO THIS LAB APPOINTMENT      Follow-Up:  Your physician wants you to follow-up in: [place] will receive a reminder letter in the mail two months in advance. If you don't receive a letter, please call our office to schedule the follow-up appointment.       If you need a refill on your cardiac medications before your next appointment, please call your pharmacy.

## 2017-06-30 NOTE — Progress Notes (Signed)
06/30/2017 Madison Kramer   Jun 02, 1946  [id number]  Primary Physician [name], MD Primary Cardiologist: Dr. Meda Coffee (formerly Dr. Aundra Dubin)  Reason for Visit/CC: Dyspnea   HPI:  Madison Kramer is a 71 y.o. female who is being seen today for the evaluation of dyspnea. She is a former patient of Dr. Claris Gladden who has now been assigned to Dr. Meda Coffee. Her PMH is notable for atypical chest pain and palpitations. She has undergone ischemic w/u. She had a Cardiolite NST in 3/11 that was normal and had a repeat study 5/17 that was also normal with no ischemia of infarction. 2D echo in 2014 was normal with normal LVEF. She also has treated HTN and HLD. She is on losartan and Lipitor. She is very active for her age. She still works. She is a Forensic psychologist. Also exercises daily (swimming, treadmill, stair climber, elliptical and light weight training). She also notes chronic allergies (dust mites and mold). 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  As noticed mild dyspnea with certain activities. She notes that she sometimes get short of breath if she is walking very quickly while trying to talk at the same time. She also feels heavy mucus buildup in her throat when this happens. She thinks this may be related to her allergies. Interestingly, she denies any similar symptoms when she's at the gym on the treadmill, stair climber or elliptical machine. She also denies any associated exertional chest discomfort. She admits that she thinks a lot of this may also be anxiety related and mainly just wants reassurance that she doesn't need to do anything additional from a cardiac standpoint.  06/30/2017 - this is 3 months follow-up, the patient states that she has been feeling great she continues to swing Treadmill shortness of breath or chest pain unless she is doing very strenuous activity. She denies any palpitations no syncope no lower extremity edema orthopnea.  She has no side effects with Lipitor. She has been seeing nutritionist for IBS and she recommended to decrease dose of atorvastatin if possible. She has been following strict diet lactose-free and gluten-free with significant improvement with her IBS.  Current Meds  Medication Sig  . atorvastatin (LIPITOR) 40 MG tablet TAKE 1 TABLET ONCE DAILY AT 6 PM.  . Calcium Carbonate-Vitamin D (CALCIUM + D PO) Take 2 tablets by mouth daily.   . Cholecalciferol (VITAMIN D PO) Take 1 tablet by mouth daily. PATIENT NOT SURE OF DOSAGE  . fluticasone (FLONASE) 50 MCG/ACT nasal spray Place 1 spray into both nostrils daily.  Marland Kitchen losartan (COZAAR) 50 MG tablet Take 1 tablet (50 mg total) by mouth daily.  . Magnesium 250 MG TABS Take 250 mg by mouth daily.  . Multiple Vitamin (MULTIVITAMIN) capsule Take 1 capsule by mouth daily.    . Na Sulfate-K Sulfate-Mg Sulf 17.5-3.13-1.6 GM/180ML SOLN Take as directed  . pantoprazole (PROTONIX) 40 MG tablet Take 1 tablet by mouth every morning.  . Probiotic Product (PROBIOTIC DAILY PO) Take 1 tablet by mouth daily.  . traZODone (DESYREL) 50 MG tablet Take 25 mg by mouth at bedtime.  . TURMERIC PO Take 1 tablet by mouth daily.   Allergies  Allergen Reactions  . Codeine Nausea Only  . Erythromycin Other (See Comments)    Stomach cramping  . Sulfa Antibiotics     As a child  . Sulfamethoxazole Other (See Comments)    Unsdure of reaction. Had as a child.  Marland Kitchen  Sulfonamide Derivatives   . Tetracyclines & Related     unknown  . Tramadol Nausea Only and Other (See Comments)    Dizziness and mind racing   Past Medical History:  Diagnosis Date  . Allergic rhinitis   . Anxiety   . Arthritis   . Asthma   . Atypical chest pain    Possibly from DES  . Back pain   . Cataract    Hx; of  . Diverticulosis    Left  . Elevated cholesterol   . GERD (gastroesophageal reflux disease)   . Hemoptysis 2007   From reflux pharyngitis  . History of nuclear stress test    Myoview  5/17: EF 70%, no scar or ischemia, excellent exercise capacity, low risk  . Hypertension   . Insomnia   . Lactose intolerance   . Lichen sclerosus et atrophicus of the vulva 2008   Biopsy-proven  . Nocturia   . Osteoporosis 10/2015   T score -2.5 left femoral neck  . Pneumonia   . Raynaud's syndrome    "possible"   Family History  Problem Relation Age of Onset  . Heart disease Mother        died at 30; ? MI  . Hypertension Mother   . Diabetes Mother   . Multiple myeloma Father   . Heart disease Paternal Grandfather        age 41 - died of MI  . Breast cancer Maternal Aunt        Age 61's  . Heart disease Maternal Uncle    Past Surgical History:  Procedure Laterality Date  . BREAST SURGERY  2001   REDUCTION MAMMAPLASTY  . COLONOSCOPY    . FOOT SURGERY  2009  . HYSTEROSCOPY  2003   HYSTEROSCOPIC RESECTION OF MYOMA  . INGUINAL HERNIA REPAIR  1971   BILATERAL  . LUMBAR LAMINECTOMY/DECOMPRESSION MICRODISCECTOMY Right 08/30/2013   Procedure: Right Lumbar four-five Extraforaminal Diskectomy;  Surgeon: Hosie Spangle, MD;  Location: [facility];  Service: Neurosurgery;  Laterality: Right;  . MANDIBLE SURGERY    . MYOMECTOMY    . TONSILLECTOMY    . TUBAL LIGATION  1987   Social History   Social History  . Marital status: Divorced    Spouse name: N/A  . Number of children: 2  . 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 Smoking status: Former Smoker    Years: 20.00    Quit date: 12/12/1988  . Smokeless tobacco: Never Used  . Alcohol use 4.2 oz/week    7 Standard drinks or equivalent per week     Comment:    . Drug use: No  . Sexual activity: No     Comment: 1st intercourse 71 yo-Fewer than 5 partners   Other Topics Concern  . Not on file   Social History Narrative  . No narrative on file     Review of Systems: General: negative for chills, fever, night sweats or weight changes.  Cardiovascular:  negative for chest pain, dyspnea on exertion, edema, orthopnea, palpitations, paroxysmal nocturnal dyspnea or shortness of breath Dermatological: negative for rash Respiratory: negative for cough or wheezing Urologic: negative for hematuria Abdominal: negative for nausea, vomiting, diarrhea, bright red blood per rectum, melena, or hematemesis Neurologic: negative for visual changes, syncope, or dizziness All other systems reviewed and are otherwise negative except as noted above.   Physical Exam:  Blood pressure  122/62, pulse (!) 57, height 5' 3.75" (1.619 m), weight 110 lb (49.9 kg), SpO2 97 %.  General appearance: alert, cooperative, no distress and slighlty anxious Neck: no carotid bruit and no JVD Lungs: clear to auscultation bilaterally Heart: regular rate and rhythm, S1, S2 normal, no murmur, click, rub or gallop Extremities: extremities normal, atraumatic, no cyanosis or edema Pulses: 2+ and symmetric Skin: Skin color, texture, turgor normal. No rashes or lesions Neurologic: Grossly normal  EKG  ectopic atrial rhythm. Heart rate 62 bpm -- personally reviewed     ASSESSMENT AND PLAN:   1. Shortness of breath family history of premature coronary artery disease in her mother, however her mom was a smoker didn't exercise. The patient is currently asymptomatic and had a recent negative stress test. She is already on atorvastatin 40 daily, there is no need for further ischemic workup at this point unless there are changes in her symptoms.  2. Hypertension - well controlled, we will decrease dose of losartan to 25 mg daily.  3. Hyperlipidemia- on 40 mg of atorvastatin daily, we'll recheck her lipids and CMP next week, and decrease atorvastatin to 20 mg daily before lipids are at goal.  Follow-up in 6 months.    Ena Dawley , MD [facility] 06/30/2017 10:19 AM

## 2017-07-03 ENCOUNTER — Other Ambulatory Visit: Payer: Medicare Other | Admitting: *Deleted

## 2017-07-03 DIAGNOSIS — I10 Essential (primary) hypertension: Secondary | ICD-10-CM

## 2017-07-03 DIAGNOSIS — R0600 Dyspnea, unspecified: Secondary | ICD-10-CM

## 2017-07-03 DIAGNOSIS — E782 Mixed hyperlipidemia: Secondary | ICD-10-CM | POA: Diagnosis not present

## 2017-07-03 LAB — COMPREHENSIVE METABOLIC PANEL
ALT: 25 IU/L (ref 0–32)
AST: 24 IU/L (ref 0–40)
Albumin/Globulin Ratio: 2.2 (ref 1.2–2.2)
Albumin: 4.4 g/dL (ref 3.5–4.8)
Alkaline Phosphatase: 48 IU/L (ref 39–117)
BUN/Creatinine Ratio: 41 — ABNORMAL HIGH (ref 12–28)
BUN: 31 mg/dL — ABNORMAL HIGH (ref 8–27)
Bilirubin Total: 0.6 mg/dL (ref 0.0–1.2)
CO2: 23 mmol/L (ref 20–29)
Calcium: 8.9 mg/dL (ref 8.7–10.3)
Chloride: 103 mmol/L (ref 96–106)
Creatinine, Ser: 0.76 mg/dL (ref 0.57–1.00)
GFR calc Af Amer: 92 mL/min/{1.73_m2} (ref >59)
GFR calc non Af Amer: 80 mL/min/{1.73_m2} (ref >59)
Globulin, Total: 2 g/dL (ref 1.5–4.5)
Glucose: 89 mg/dL (ref 65–99)
Potassium: 4.7 mmol/L (ref 3.5–5.2)
Sodium: 139 mmol/L (ref 134–144)
Total Protein: 6.4 g/dL (ref 6.0–8.5)

## 2017-07-03 LAB — CBC WITH DIFFERENTIAL/PLATELET
Basophils Absolute: 0.1 10*3/uL (ref 0.0–0.2)
Basos: 1 %
EOS (ABSOLUTE): 0.2 10*3/uL (ref 0.0–0.4)
Eos: 4 %
Hematocrit: 40.2 % (ref 34.0–46.6)
Hemoglobin: 13.8 g/dL (ref 11.1–15.9)
Immature Grans (Abs): 0 10*3/uL (ref 0.0–0.1)
Immature Granulocytes: 0 %
Lymphocytes Absolute: 1.9 10*3/uL (ref 0.7–3.1)
Lymphs: 44 %
MCH: 30.7 pg (ref 26.6–33.0)
MCHC: 34.3 g/dL (ref 31.5–35.7)
MCV: 90 fL (ref 79–97)
Monocytes Absolute: 0.6 10*3/uL (ref 0.1–0.9)
Monocytes: 13 %
Neutrophils Absolute: 1.7 10*3/uL (ref 1.4–7.0)
Neutrophils: 38 %
Platelets: 269 10*3/uL (ref 150–379)
RBC: 4.49 x10E6/uL (ref 3.77–5.28)
RDW: 13.1 % (ref 12.3–15.4)
WBC: 4.4 10*3/uL (ref 3.4–10.8)

## 2017-07-03 LAB — LIPID PANEL
Chol/HDL Ratio: 2.3 ratio (ref 0.0–4.4)
Cholesterol, Total: 153 mg/dL (ref 100–199)
HDL: 67 mg/dL (ref >39)
LDL Calculated: 69 mg/dL (ref 0–99)
Triglycerides: 85 mg/dL (ref 0–149)
VLDL Cholesterol Cal: 17 mg/dL (ref 5–40)

## 2017-07-03 LAB — TSH: TSH: 1.23 u[IU]/mL (ref 0.450–4.500)

## 2017-07-04 ENCOUNTER — Telehealth: Payer: Self-pay | Admitting: Cardiology

## 2017-07-04 DIAGNOSIS — M545 Low back pain: Secondary | ICD-10-CM | POA: Diagnosis not present

## 2017-07-04 DIAGNOSIS — M5416 Radiculopathy, lumbar region: Secondary | ICD-10-CM | POA: Diagnosis not present

## 2017-07-04 DIAGNOSIS — M5126 Other intervertebral disc displacement, lumbar region: Secondary | ICD-10-CM | POA: Diagnosis not present

## 2017-07-04 NOTE — Telephone Encounter (Signed)
Went over the pts preliminary lab report with her, but did inform her that Dr Meda Coffee has not reviewed these results and placed her final interpretation on them.  Informed the pt that once Dr Meda Coffee results her labs, I will shortly follow-up with her thereafter.  Pt verbalized understanding and agrees with this plan.

## 2017-07-04 NOTE — Telephone Encounter (Signed)
New message    Pt is calling about her lab results. She would like them. Please call.

## 2017-07-05 ENCOUNTER — Encounter: Payer: Self-pay | Admitting: Internal Medicine

## 2017-07-05 ENCOUNTER — Ambulatory Visit (AMBULATORY_SURGERY_CENTER): Payer: Medicare Other | Admitting: Internal Medicine

## 2017-07-05 VITALS — BP 137/76 | HR 55 | Temp 98.6°F | Resp 12 | Ht 63.75 in | Wt 112.0 lb

## 2017-07-05 DIAGNOSIS — Z1212 Encounter for screening for malignant neoplasm of rectum: Secondary | ICD-10-CM

## 2017-07-05 DIAGNOSIS — Z1211 Encounter for screening for malignant neoplasm of colon: Secondary | ICD-10-CM

## 2017-07-05 DIAGNOSIS — K589 Irritable bowel syndrome without diarrhea: Secondary | ICD-10-CM | POA: Diagnosis not present

## 2017-07-05 DIAGNOSIS — I10 Essential (primary) hypertension: Secondary | ICD-10-CM | POA: Diagnosis not present

## 2017-07-05 DIAGNOSIS — J45909 Unspecified asthma, uncomplicated: Secondary | ICD-10-CM | POA: Diagnosis not present

## 2017-07-05 MED ORDER — SODIUM CHLORIDE 0.9 % IV SOLN: 500.0000 mL | INTRAVENOUS | Status: AC

## 2017-07-05 NOTE — Progress Notes (Signed)
To recovery, report to Oliver, RN, VSS 

## 2017-07-05 NOTE — Progress Notes (Signed)
Pt's states no medical or surgical changes since previsit or office visit. 

## 2017-07-05 NOTE — Patient Instructions (Signed)
**  Handouts given on Diverticulosis and High Fiber Diet**  YOU HAD AN ENDOSCOPIC PROCEDURE TODAY: Refer to the procedure report and other information in the discharge instructions given to you for any specific questions about what was found during the examination. If this information does not answer your questions, please call [place] office at [phone] to clarify.   YOU SHOULD EXPECT: Some feelings of bloating in the abdomen. Passage of more gas than usual. Walking can help get rid of the air that was put into your GI tract during the procedure and reduce the bloating. If you had a lower endoscopy (such as a colonoscopy or flexible sigmoidoscopy) you may notice spotting of blood in your stool or on the toilet paper. Some abdominal soreness may be present for a day or two, also.  DIET: Your first meal following the procedure should be a light meal and then it is ok to progress to your normal diet. A half-sandwich or bowl of soup is an example of a good first meal. Heavy or fried foods are harder to digest and may make you feel nauseous or bloated. Drink plenty of fluids but you should avoid alcoholic beverages for 24 hours. If you had a esophageal dilation, please see attached instructions for diet.    ACTIVITY: Your care partner should take you home directly after the procedure. You should plan to take it easy, moving slowly for the rest of the day. You can resume normal activity the day after the procedure however YOU SHOULD NOT DRIVE, use power tools, machinery or perform tasks that involve climbing or major physical exertion for 24 hours (because of the sedation medicines used during the test).   SYMPTOMS TO REPORT IMMEDIATELY: A gastroenterologist can be reached at any hour. Please call [phone]  for any of the following symptoms:  Following lower endoscopy (colonoscopy, flexible sigmoidoscopy) Excessive amounts of blood in the stool  Significant tenderness, worsening of abdominal pains   Swelling of the abdomen that is new, acute  Fever of 100 or higher    FOLLOW UP:  If any biopsies were taken you will be contacted by phone or by letter within the next 1-3 weeks. Call [phone]  if you have not heard about the biopsies in 3 weeks.  Please also call with any specific questions about appointments or follow up tests.

## 2017-07-05 NOTE — Op Note (Signed)
[facility] Patient Name: Madison Kramer Procedure Date: 07/05/2017 3:44 PM MRN: [id number] Endoscopist: Docia Chuck. Henrene Pastor , MD Age: 71 Referring MD:  Date of Birth: 1946-09-27 Gender: Female Account #: [id number] Procedure:                Colonoscopy Indications:              Screening for colorectal malignant neoplasm.                            Previous examination 2008 (Dr. Verna Czech                            diverticulosis only Medicines:                Monitored Anesthesia Care Procedure:                Pre-Anesthesia Assessment:                           - Prior to the procedure, a History and Physical                            was performed, and patient medications and                            allergies were reviewed. The patient's tolerance of                            previous anesthesia was also reviewed. The risks                            and benefits of the procedure and the sedation                            options and risks were discussed with the patient.                            All questions were answered, and informed consent                            was obtained. Prior Anticoagulants: The patient has                            taken no previous anticoagulant or antiplatelet                            agents. ASA Grade Assessment: II - A patient with                            mild systemic disease. After reviewing the risks                            and benefits, the patient was deemed in  satisfactory condition to undergo the procedure.                           After obtaining informed consent, the colonoscope                            was passed under direct vision. Throughout the                            procedure, the patient's blood pressure, pulse, and                            oxygen saturations were monitored continuously. The                            Colonoscope was introduced through the anus and                             advanced to the the cecum, identified by                            appendiceal orifice and ileocecal valve. The                            ileocecal valve, appendiceal orifice, and rectum                            were photographed. The quality of the bowel                            preparation was excellent. The colonoscopy was                            performed without difficulty. The patient tolerated                            the procedure well. The bowel preparation used was                            SUPREP. Scope In: 3:48:45 PM Scope Out: 3:59:48 PM Scope Withdrawal Time: 0 hours 8 minutes 22 seconds  Total Procedure Duration: 0 hours 11 minutes 3 seconds  Findings:                 Multiple diverticula were found in the sigmoid                            colon and ascending colon.                           Small internal hemorrhoids present. The exam was                            otherwise without abnormality on direct and  retroflexion views. Complications:            No immediate complications. Estimated blood loss:                            None. Estimated Blood Loss:     Estimated blood loss: none. Impression:               - Diverticulosis in the sigmoid colon and in the                            ascending colon.                           - The examination was otherwise normal on direct                            and retroflexion views.                           - No specimens collected. Recommendation:           - Repeat colonoscopy is not recommended for                            screening purposes.                           - Patient has a contact number available for                            emergencies. The signs and symptoms of potential                            delayed complications were discussed with the                            patient. Return to normal activities tomorrow.                             Written discharge instructions were provided to the                            patient.                           - Resume previous diet.                           - Continue present medications. Docia Chuck. Henrene Pastor, MD 07/05/2017 4:05:09 PM This report has been signed electronically.

## 2017-07-06 ENCOUNTER — Telehealth: Payer: Self-pay

## 2017-07-06 NOTE — Telephone Encounter (Signed)
  Follow up Call-  Call back number 07/05/2017  Post procedure Call Back phone  # [phone]  Permission to leave phone message Yes  Some recent data might be hidden     Patient questions:  Do you have a fever, pain , or abdominal swelling? Yes.   Pain Score  PATIENT STATES RIGHT HIP HURTS FROM POSITIONING. INSTRUCTED TO TAKE PAIN MEDICATION FOR RELIEF AND CALL BACK IF WORSEN. *  Have you tolerated food without any problems? Yes.    Have you been able to return to your normal activities? Yes.    Do you have any questions about your discharge instructions: Diet   No. Medications  No. Follow up visit  No.  Do you have questions or concerns about your Care? No.  Actions: * If pain score is 4 or above: No action needed, pain <4.

## 2017-07-11 ENCOUNTER — Telehealth: Payer: Self-pay | Admitting: *Deleted

## 2017-07-11 MED ORDER — ATORVASTATIN CALCIUM 20 MG PO TABS: 20.0000 mg | ORAL_TABLET | Freq: Every day | ORAL | 2 refills | Status: DC

## 2017-07-11 NOTE — Telephone Encounter (Signed)
-----   Message from Dorothy Spark, MD sent at 07/10/2017  6:30 PM EDT ----- Yes, please decrease to 20 mg po daily  ----- Message ----- From: Nuala Alpha, LPN Sent: 8/92/1194   6:21 PM To: Dorothy Spark, MD  Pt aware of normal labs per Dr Meda Coffee.  Pt did however want to ask if she could decrease or come off her atorvastatin, due to her Nutritionist saying the current dose she is on is too much for her size.  Informed the pt that I will route her request to Dr Meda Coffee to review and advise on, and follow-up with the pt accordingly thereafter.  Pt verbalized understanding and agrees with this plan.

## 2017-07-11 NOTE — Telephone Encounter (Signed)
Spoke with the pt and informed her that Dr Delight Ovens to decrease her atorvastatin from 40 mg to 20 mg po daily.  Confirmed the pharmacy of choice with the pt.  Pt verbalized understanding and agrees with this plan.  Pt gracious for all the assistance provided.

## 2017-07-14 DIAGNOSIS — F4322 Adjustment disorder with anxiety: Secondary | ICD-10-CM | POA: Diagnosis not present

## 2017-08-02 DIAGNOSIS — F4322 Adjustment disorder with anxiety: Secondary | ICD-10-CM | POA: Diagnosis not present

## 2017-08-03 DIAGNOSIS — S9002XA Contusion of left ankle, initial encounter: Secondary | ICD-10-CM | POA: Diagnosis not present

## 2017-08-03 DIAGNOSIS — J3089 Other allergic rhinitis: Secondary | ICD-10-CM | POA: Diagnosis not present

## 2017-08-09 DIAGNOSIS — L821 Other seborrheic keratosis: Secondary | ICD-10-CM | POA: Diagnosis not present

## 2017-08-09 DIAGNOSIS — D225 Melanocytic nevi of trunk: Secondary | ICD-10-CM | POA: Diagnosis not present

## 2017-08-09 DIAGNOSIS — L603 Nail dystrophy: Secondary | ICD-10-CM | POA: Diagnosis not present

## 2017-08-09 DIAGNOSIS — L814 Other melanin hyperpigmentation: Secondary | ICD-10-CM | POA: Diagnosis not present

## 2017-08-09 DIAGNOSIS — D2239 Melanocytic nevi of other parts of face: Secondary | ICD-10-CM | POA: Diagnosis not present

## 2017-08-28 DIAGNOSIS — Z1231 Encounter for screening mammogram for malignant neoplasm of breast: Secondary | ICD-10-CM | POA: Diagnosis not present

## 2017-09-06 ENCOUNTER — Telehealth: Payer: Self-pay | Admitting: Cardiology

## 2017-09-06 DIAGNOSIS — E7849 Other hyperlipidemia: Secondary | ICD-10-CM

## 2017-09-06 DIAGNOSIS — Z23 Encounter for immunization: Secondary | ICD-10-CM | POA: Diagnosis not present

## 2017-09-06 NOTE — Telephone Encounter (Signed)
New message    Pt is calling asking about her statin.   Pt c/o medication issue:  1. Name of Medication: atorvastatin, losartan  2. How are you currently taking this medication (dosage and times per day)? Once daily  3. Are you having a reaction (difficulty breathing--STAT)? Muscle cramps  4. What is your medication issue? Pt wants to know if these could be causing her leg cramps

## 2017-09-06 NOTE — Telephone Encounter (Signed)
I would discontinue atorvastatin and repeat lipid panel in 1 month.

## 2017-09-06 NOTE — Telephone Encounter (Signed)
Pt is calling to report to Dr. Meda Coffee that she is starting to experience lower extremity muscle cramps.  Pt states this is been going on for sometime now, but she is now at the point where she thinks that we should advise on a different regimen.  Pt reports she has increased her exercise, and is currently seeing a Nutritionist, for cholesterol management and healthy eating.  Pt states that she is taking her statin as prescribed.  Pt reports its mostly leg cramps, but she does have aching all over some days. Pt would like for Dr Meda Coffee to advise on this issue. Informed the pt that Dr Meda Coffee is out of the office today, but I will route this message to her for further review and recommendation, and I will follow-up with her shortly thereafter.  Pt verbalized understanding and agrees with this plan.

## 2017-09-06 NOTE — Telephone Encounter (Signed)
Notified the pt that per Dr Meda Coffee, she recommends that we can discontinue her atorvastatin and repeat lipids in 1 month.  Scheduled the pt a lab appt for 10/26.  Pt aware to come fasting to this lab appt.  Will update atorvastatin in her allergies, as an intolerance.  Pt verbalized understanding and agrees with this plan.

## 2017-09-13 DIAGNOSIS — F4322 Adjustment disorder with anxiety: Secondary | ICD-10-CM | POA: Diagnosis not present

## 2017-09-14 DIAGNOSIS — J3089 Other allergic rhinitis: Secondary | ICD-10-CM | POA: Diagnosis not present

## 2017-09-22 DIAGNOSIS — E559 Vitamin D deficiency, unspecified: Secondary | ICD-10-CM | POA: Diagnosis not present

## 2017-09-22 DIAGNOSIS — K589 Irritable bowel syndrome without diarrhea: Secondary | ICD-10-CM | POA: Diagnosis not present

## 2017-09-22 DIAGNOSIS — M5441 Lumbago with sciatica, right side: Secondary | ICD-10-CM | POA: Diagnosis not present

## 2017-09-22 DIAGNOSIS — M542 Cervicalgia: Secondary | ICD-10-CM | POA: Diagnosis not present

## 2017-09-22 DIAGNOSIS — G8929 Other chronic pain: Secondary | ICD-10-CM | POA: Diagnosis not present

## 2017-10-04 DIAGNOSIS — M545 Low back pain: Secondary | ICD-10-CM | POA: Diagnosis not present

## 2017-10-04 DIAGNOSIS — M5416 Radiculopathy, lumbar region: Secondary | ICD-10-CM | POA: Diagnosis not present

## 2017-10-04 DIAGNOSIS — M79604 Pain in right leg: Secondary | ICD-10-CM | POA: Diagnosis not present

## 2017-10-04 DIAGNOSIS — M542 Cervicalgia: Secondary | ICD-10-CM | POA: Diagnosis not present

## 2017-10-06 ENCOUNTER — Other Ambulatory Visit: Payer: Medicare Other | Admitting: *Deleted

## 2017-10-06 DIAGNOSIS — E7849 Other hyperlipidemia: Secondary | ICD-10-CM | POA: Diagnosis not present

## 2017-10-06 LAB — LIPID PANEL
Chol/HDL Ratio: 3.2 ratio (ref 0.0–4.4)
Cholesterol, Total: 262 mg/dL — ABNORMAL HIGH (ref 100–199)
HDL: 81 mg/dL (ref >39)
LDL Calculated: 157 mg/dL — ABNORMAL HIGH (ref 0–99)
Triglycerides: 118 mg/dL (ref 0–149)
VLDL Cholesterol Cal: 24 mg/dL (ref 5–40)

## 2017-10-10 ENCOUNTER — Telehealth: Payer: Self-pay | Admitting: Cardiology

## 2017-10-10 DIAGNOSIS — Z5181 Encounter for therapeutic drug level monitoring: Secondary | ICD-10-CM

## 2017-10-10 DIAGNOSIS — I10 Essential (primary) hypertension: Secondary | ICD-10-CM

## 2017-10-10 DIAGNOSIS — E782 Mixed hyperlipidemia: Secondary | ICD-10-CM

## 2017-10-10 MED ORDER — ROSUVASTATIN CALCIUM 5 MG PO TABS: 5.0000 mg | ORAL_TABLET | Freq: Every day | ORAL | 3 refills | Status: DC

## 2017-10-10 NOTE — Telephone Encounter (Signed)
-----   Message from Dorothy Spark, MD sent at 10/09/2017  8:26 AM EDT ----- Her LDL is now 157 that is very high, I would try rosuvastatin 5 mg po daily and see if she tolerates that. Repeat lipids in 2 months with CMP.

## 2017-10-10 NOTE — Telephone Encounter (Signed)
New message  Pt call requesting to speak with RN about lab results. Please call back to discuss

## 2017-10-10 NOTE — Telephone Encounter (Signed)
Patient informed and agrees to start crestor 5 mg daily. Patient will call our office back with the response to crestor.  Awaiting call back from patient.

## 2017-10-12 DIAGNOSIS — M542 Cervicalgia: Secondary | ICD-10-CM | POA: Diagnosis not present

## 2017-10-12 DIAGNOSIS — M79604 Pain in right leg: Secondary | ICD-10-CM | POA: Diagnosis not present

## 2017-10-12 DIAGNOSIS — M545 Low back pain: Secondary | ICD-10-CM | POA: Diagnosis not present

## 2017-10-12 DIAGNOSIS — M5416 Radiculopathy, lumbar region: Secondary | ICD-10-CM | POA: Diagnosis not present

## 2017-10-16 DIAGNOSIS — M5416 Radiculopathy, lumbar region: Secondary | ICD-10-CM | POA: Diagnosis not present

## 2017-10-16 DIAGNOSIS — J3089 Other allergic rhinitis: Secondary | ICD-10-CM | POA: Diagnosis not present

## 2017-10-16 DIAGNOSIS — M542 Cervicalgia: Secondary | ICD-10-CM | POA: Diagnosis not present

## 2017-10-16 DIAGNOSIS — M545 Low back pain: Secondary | ICD-10-CM | POA: Diagnosis not present

## 2017-10-16 DIAGNOSIS — J019 Acute sinusitis, unspecified: Secondary | ICD-10-CM | POA: Diagnosis not present

## 2017-10-16 DIAGNOSIS — H1045 Other chronic allergic conjunctivitis: Secondary | ICD-10-CM | POA: Diagnosis not present

## 2017-10-16 DIAGNOSIS — M79604 Pain in right leg: Secondary | ICD-10-CM | POA: Diagnosis not present

## 2017-10-16 DIAGNOSIS — J4530 Mild persistent asthma, uncomplicated: Secondary | ICD-10-CM | POA: Diagnosis not present

## 2017-10-18 DIAGNOSIS — M545 Low back pain: Secondary | ICD-10-CM | POA: Diagnosis not present

## 2017-10-18 DIAGNOSIS — M79604 Pain in right leg: Secondary | ICD-10-CM | POA: Diagnosis not present

## 2017-10-18 DIAGNOSIS — M542 Cervicalgia: Secondary | ICD-10-CM | POA: Diagnosis not present

## 2017-10-18 DIAGNOSIS — M5416 Radiculopathy, lumbar region: Secondary | ICD-10-CM | POA: Diagnosis not present

## 2017-10-23 DIAGNOSIS — M542 Cervicalgia: Secondary | ICD-10-CM | POA: Diagnosis not present

## 2017-10-23 DIAGNOSIS — M545 Low back pain: Secondary | ICD-10-CM | POA: Diagnosis not present

## 2017-10-23 DIAGNOSIS — M5416 Radiculopathy, lumbar region: Secondary | ICD-10-CM | POA: Diagnosis not present

## 2017-10-23 DIAGNOSIS — M79604 Pain in right leg: Secondary | ICD-10-CM | POA: Diagnosis not present

## 2017-10-24 ENCOUNTER — Telehealth: Payer: Self-pay | Admitting: *Deleted

## 2017-10-24 DIAGNOSIS — I10 Essential (primary) hypertension: Secondary | ICD-10-CM | POA: Diagnosis not present

## 2017-10-24 DIAGNOSIS — E7800 Pure hypercholesterolemia, unspecified: Secondary | ICD-10-CM | POA: Diagnosis not present

## 2017-10-24 DIAGNOSIS — Z0000 Encounter for general adult medical examination without abnormal findings: Secondary | ICD-10-CM | POA: Diagnosis not present

## 2017-10-24 DIAGNOSIS — Z1389 Encounter for screening for other disorder: Secondary | ICD-10-CM | POA: Diagnosis not present

## 2017-10-24 NOTE — Telephone Encounter (Signed)
-----   Message from Merlene Laughter, LPN sent at 32/41/9914  4:05 PM EDT ----- Regarding: FOLLOW UP/CMET/LIPID/11/2017/[facility] FOR F/U PURPOSES SHE MAY NOT BE ABLE TO TAKE CRESTOR-SO LAB ORDERS CAN BE CX. SHE WILL CALL OFFICE AND LET us KNOW IF SHE CAN TOLERATE IT

## 2017-10-26 DIAGNOSIS — M79604 Pain in right leg: Secondary | ICD-10-CM | POA: Diagnosis not present

## 2017-10-26 DIAGNOSIS — M5416 Radiculopathy, lumbar region: Secondary | ICD-10-CM | POA: Diagnosis not present

## 2017-10-26 DIAGNOSIS — M542 Cervicalgia: Secondary | ICD-10-CM | POA: Diagnosis not present

## 2017-10-26 DIAGNOSIS — M545 Low back pain: Secondary | ICD-10-CM | POA: Diagnosis not present

## 2017-10-30 DIAGNOSIS — J3089 Other allergic rhinitis: Secondary | ICD-10-CM | POA: Diagnosis not present

## 2017-11-29 DIAGNOSIS — M5416 Radiculopathy, lumbar region: Secondary | ICD-10-CM | POA: Diagnosis not present

## 2017-11-29 DIAGNOSIS — M545 Low back pain: Secondary | ICD-10-CM | POA: Diagnosis not present

## 2017-11-29 DIAGNOSIS — M542 Cervicalgia: Secondary | ICD-10-CM | POA: Diagnosis not present

## 2017-11-29 DIAGNOSIS — M79604 Pain in right leg: Secondary | ICD-10-CM | POA: Diagnosis not present

## 2017-12-04 DIAGNOSIS — R51 Headache: Secondary | ICD-10-CM | POA: Diagnosis not present

## 2017-12-08 DIAGNOSIS — J069 Acute upper respiratory infection, unspecified: Secondary | ICD-10-CM | POA: Diagnosis not present

## 2017-12-11 ENCOUNTER — Other Ambulatory Visit: Payer: Medicare Other

## 2017-12-20 DIAGNOSIS — M545 Low back pain: Secondary | ICD-10-CM | POA: Diagnosis not present

## 2017-12-20 DIAGNOSIS — M79604 Pain in right leg: Secondary | ICD-10-CM | POA: Diagnosis not present

## 2017-12-20 DIAGNOSIS — M542 Cervicalgia: Secondary | ICD-10-CM | POA: Diagnosis not present

## 2017-12-20 DIAGNOSIS — M5416 Radiculopathy, lumbar region: Secondary | ICD-10-CM | POA: Diagnosis not present

## 2017-12-21 DIAGNOSIS — J3089 Other allergic rhinitis: Secondary | ICD-10-CM | POA: Diagnosis not present

## 2018-01-01 DIAGNOSIS — J3089 Other allergic rhinitis: Secondary | ICD-10-CM | POA: Diagnosis not present

## 2018-01-05 DIAGNOSIS — M5416 Radiculopathy, lumbar region: Secondary | ICD-10-CM | POA: Diagnosis not present

## 2018-01-05 DIAGNOSIS — M545 Low back pain: Secondary | ICD-10-CM | POA: Diagnosis not present

## 2018-01-05 DIAGNOSIS — M79604 Pain in right leg: Secondary | ICD-10-CM | POA: Diagnosis not present

## 2018-01-05 DIAGNOSIS — M542 Cervicalgia: Secondary | ICD-10-CM | POA: Diagnosis not present

## 2018-01-15 DIAGNOSIS — J301 Allergic rhinitis due to pollen: Secondary | ICD-10-CM | POA: Diagnosis not present

## 2018-01-15 DIAGNOSIS — J3089 Other allergic rhinitis: Secondary | ICD-10-CM | POA: Diagnosis not present

## 2018-01-17 DIAGNOSIS — J3089 Other allergic rhinitis: Secondary | ICD-10-CM | POA: Diagnosis not present

## 2018-01-22 DIAGNOSIS — J301 Allergic rhinitis due to pollen: Secondary | ICD-10-CM | POA: Diagnosis not present

## 2018-01-22 DIAGNOSIS — J3089 Other allergic rhinitis: Secondary | ICD-10-CM | POA: Diagnosis not present

## 2018-01-25 DIAGNOSIS — M5416 Radiculopathy, lumbar region: Secondary | ICD-10-CM | POA: Diagnosis not present

## 2018-01-25 DIAGNOSIS — J301 Allergic rhinitis due to pollen: Secondary | ICD-10-CM | POA: Diagnosis not present

## 2018-01-25 DIAGNOSIS — M545 Low back pain: Secondary | ICD-10-CM | POA: Diagnosis not present

## 2018-01-25 DIAGNOSIS — M542 Cervicalgia: Secondary | ICD-10-CM | POA: Diagnosis not present

## 2018-01-25 DIAGNOSIS — M79604 Pain in right leg: Secondary | ICD-10-CM | POA: Diagnosis not present

## 2018-01-25 DIAGNOSIS — J3089 Other allergic rhinitis: Secondary | ICD-10-CM | POA: Diagnosis not present

## 2018-01-29 DIAGNOSIS — J3089 Other allergic rhinitis: Secondary | ICD-10-CM | POA: Diagnosis not present

## 2018-02-14 DIAGNOSIS — Z961 Presence of intraocular lens: Secondary | ICD-10-CM | POA: Diagnosis not present

## 2018-02-15 DIAGNOSIS — M5416 Radiculopathy, lumbar region: Secondary | ICD-10-CM | POA: Diagnosis not present

## 2018-02-15 DIAGNOSIS — M79604 Pain in right leg: Secondary | ICD-10-CM | POA: Diagnosis not present

## 2018-02-15 DIAGNOSIS — M542 Cervicalgia: Secondary | ICD-10-CM | POA: Diagnosis not present

## 2018-02-15 DIAGNOSIS — M545 Low back pain: Secondary | ICD-10-CM | POA: Diagnosis not present

## 2018-02-21 DIAGNOSIS — L738 Other specified follicular disorders: Secondary | ICD-10-CM | POA: Diagnosis not present

## 2018-02-21 DIAGNOSIS — L821 Other seborrheic keratosis: Secondary | ICD-10-CM | POA: Diagnosis not present

## 2018-02-21 DIAGNOSIS — D225 Melanocytic nevi of trunk: Secondary | ICD-10-CM | POA: Diagnosis not present

## 2018-02-23 DIAGNOSIS — J3089 Other allergic rhinitis: Secondary | ICD-10-CM | POA: Diagnosis not present

## 2018-03-08 DIAGNOSIS — M545 Low back pain: Secondary | ICD-10-CM | POA: Diagnosis not present

## 2018-03-08 DIAGNOSIS — M79604 Pain in right leg: Secondary | ICD-10-CM | POA: Diagnosis not present

## 2018-03-08 DIAGNOSIS — M542 Cervicalgia: Secondary | ICD-10-CM | POA: Diagnosis not present

## 2018-03-08 DIAGNOSIS — M5416 Radiculopathy, lumbar region: Secondary | ICD-10-CM | POA: Diagnosis not present

## 2018-03-13 DIAGNOSIS — M2580 Other specified joint disorders, unspecified joint: Secondary | ICD-10-CM | POA: Diagnosis not present

## 2018-03-19 DIAGNOSIS — J3089 Other allergic rhinitis: Secondary | ICD-10-CM | POA: Diagnosis not present

## 2018-03-28 DIAGNOSIS — M542 Cervicalgia: Secondary | ICD-10-CM | POA: Diagnosis not present

## 2018-03-28 DIAGNOSIS — M79604 Pain in right leg: Secondary | ICD-10-CM | POA: Diagnosis not present

## 2018-03-28 DIAGNOSIS — M545 Low back pain: Secondary | ICD-10-CM | POA: Diagnosis not present

## 2018-03-28 DIAGNOSIS — M5416 Radiculopathy, lumbar region: Secondary | ICD-10-CM | POA: Diagnosis not present

## 2018-03-29 DIAGNOSIS — S9002XD Contusion of left ankle, subsequent encounter: Secondary | ICD-10-CM | POA: Diagnosis not present

## 2018-04-16 ENCOUNTER — Ambulatory Visit (INDEPENDENT_AMBULATORY_CARE_PROVIDER_SITE_OTHER): Payer: Medicare Other | Admitting: Gynecology

## 2018-04-16 ENCOUNTER — Encounter: Payer: Self-pay | Admitting: Gynecology

## 2018-04-16 VITALS — BP 120/74 | Ht 64.0 in | Wt 109.0 lb

## 2018-04-16 DIAGNOSIS — N952 Postmenopausal atrophic vaginitis: Secondary | ICD-10-CM

## 2018-04-16 DIAGNOSIS — M810 Age-related osteoporosis without current pathological fracture: Secondary | ICD-10-CM

## 2018-04-16 DIAGNOSIS — Z01419 Encounter for gynecological examination (general) (routine) without abnormal findings: Secondary | ICD-10-CM

## 2018-04-16 NOTE — Progress Notes (Signed)
    KENDRICK REMIGIO Sep 09, 1946 [id number]        72 y.o.  G2P2002 for breast and pelvic exam.  Doing well without gynecologic complaints.  Past medical history,surgical history, problem list, medications, allergies, family history and social history were all reviewed and documented as reviewed in the EPIC chart.  ROS:  Performed with pertinent positives and negatives included in the history, assessment and plan.   Additional significant findings : None   Exam: Caryn Bee assistant Vitals:   04/16/18 1131  BP: 120/74  Weight: 109 lb (49.4 kg)  Height: 5\' 4"  (1.626 m)   Body mass index is 18.71 kg/m.  General appearance:  Normal affect, orientation and appearance. Skin: Grossly normal HEENT: Without gross lesions.  No cervical or supraclavicular adenopathy. Thyroid normal.  Lungs:  Clear without wheezing, rales or rhonchi Cardiac: RR, without RMG Abdominal:  Soft, nontender, without masses, guarding, rebound, organomegaly or hernia Breasts:  Examined lying and sitting without masses, retractions, discharge or axillary adenopathy. Pelvic:  Ext, BUS, Vagina: With atrophic changes  Cervix: With atrophic changes  Uterus: Anteverted, normal size, shape and contour, midline and mobile nontender   Adnexa: Without masses or tenderness    Anus and perineum: Normal   Rectovaginal: Normal sphincter tone without palpated masses or tenderness.    Assessment/Plan:  72 y.o. [id number] female for breast and pelvic exam.   1. Postmenopausal/atrophic genital changes.  No significant hot flushes, night sweats, vaginal dryness or any bleeding. 2. Mammography reported this year.  Continue with annual mammography when due.  Breast exam normal today. 3. Osteoporosis.  DEXA 2016 T score -2.5.  Had used bisphosphate's for 5 years discontinued in 2011.  I recommended she follow-up with Dr. Laurann Montana where she has her bone densities done and have another one done now at a 2-1/2-year interval.  She agrees to  call and arrange. 4. Colonoscopy 2018.  Repeat at their recommended interval. 5. Pap smear 2016.  No Pap smear done today.  No history of significant abnormal Pap smears.  Reviewed current screening guidelines and we both agreed to stop screening based on age. 6. Health maintenance.  No routine lab work done as patient does this elsewhere.  Follow-up 1 year, sooner as needed.   [name] MD, 12:04 PM 04/16/2018

## 2018-04-16 NOTE — Patient Instructions (Signed)
Call Dr. Delene Ruffini office to arrange for bone density.  Follow-up in 1 year for annual exam, sooner as needed.

## 2018-04-18 DIAGNOSIS — L603 Nail dystrophy: Secondary | ICD-10-CM | POA: Diagnosis not present

## 2018-04-18 DIAGNOSIS — L03011 Cellulitis of right finger: Secondary | ICD-10-CM | POA: Diagnosis not present

## 2018-04-18 DIAGNOSIS — D485 Neoplasm of uncertain behavior of skin: Secondary | ICD-10-CM | POA: Diagnosis not present

## 2018-04-20 DIAGNOSIS — J3089 Other allergic rhinitis: Secondary | ICD-10-CM | POA: Diagnosis not present

## 2018-04-20 DIAGNOSIS — J3081 Allergic rhinitis due to animal (cat) (dog) hair and dander: Secondary | ICD-10-CM | POA: Diagnosis not present

## 2018-04-20 DIAGNOSIS — J301 Allergic rhinitis due to pollen: Secondary | ICD-10-CM | POA: Diagnosis not present

## 2018-05-02 DIAGNOSIS — D2239 Melanocytic nevi of other parts of face: Secondary | ICD-10-CM | POA: Diagnosis not present

## 2018-05-02 DIAGNOSIS — D485 Neoplasm of uncertain behavior of skin: Secondary | ICD-10-CM | POA: Diagnosis not present

## 2018-05-09 DIAGNOSIS — M545 Low back pain: Secondary | ICD-10-CM | POA: Diagnosis not present

## 2018-05-09 DIAGNOSIS — M5416 Radiculopathy, lumbar region: Secondary | ICD-10-CM | POA: Diagnosis not present

## 2018-05-09 DIAGNOSIS — M79604 Pain in right leg: Secondary | ICD-10-CM | POA: Diagnosis not present

## 2018-05-09 DIAGNOSIS — M542 Cervicalgia: Secondary | ICD-10-CM | POA: Diagnosis not present

## 2018-05-17 DIAGNOSIS — J3089 Other allergic rhinitis: Secondary | ICD-10-CM | POA: Diagnosis not present

## 2018-05-21 DIAGNOSIS — I10 Essential (primary) hypertension: Secondary | ICD-10-CM | POA: Diagnosis not present

## 2018-05-30 DIAGNOSIS — M542 Cervicalgia: Secondary | ICD-10-CM | POA: Diagnosis not present

## 2018-05-30 DIAGNOSIS — M5416 Radiculopathy, lumbar region: Secondary | ICD-10-CM | POA: Diagnosis not present

## 2018-05-30 DIAGNOSIS — M545 Low back pain: Secondary | ICD-10-CM | POA: Diagnosis not present

## 2018-05-30 DIAGNOSIS — M79604 Pain in right leg: Secondary | ICD-10-CM | POA: Diagnosis not present

## 2018-06-08 DIAGNOSIS — T63441A Toxic effect of venom of bees, accidental (unintentional), initial encounter: Secondary | ICD-10-CM | POA: Diagnosis not present

## 2018-06-12 DIAGNOSIS — T63444D Toxic effect of venom of bees, undetermined, subsequent encounter: Secondary | ICD-10-CM | POA: Diagnosis not present

## 2018-06-12 DIAGNOSIS — S60561D Insect bite (nonvenomous) of right hand, subsequent encounter: Secondary | ICD-10-CM | POA: Diagnosis not present

## 2018-06-25 DIAGNOSIS — M79604 Pain in right leg: Secondary | ICD-10-CM | POA: Diagnosis not present

## 2018-06-25 DIAGNOSIS — J4530 Mild persistent asthma, uncomplicated: Secondary | ICD-10-CM | POA: Diagnosis not present

## 2018-06-25 DIAGNOSIS — M542 Cervicalgia: Secondary | ICD-10-CM | POA: Diagnosis not present

## 2018-06-25 DIAGNOSIS — H1045 Other chronic allergic conjunctivitis: Secondary | ICD-10-CM | POA: Diagnosis not present

## 2018-06-25 DIAGNOSIS — M5416 Radiculopathy, lumbar region: Secondary | ICD-10-CM | POA: Diagnosis not present

## 2018-06-25 DIAGNOSIS — J3089 Other allergic rhinitis: Secondary | ICD-10-CM | POA: Diagnosis not present

## 2018-06-25 DIAGNOSIS — M545 Low back pain: Secondary | ICD-10-CM | POA: Diagnosis not present

## 2018-06-25 DIAGNOSIS — K219 Gastro-esophageal reflux disease without esophagitis: Secondary | ICD-10-CM | POA: Diagnosis not present

## 2018-06-26 DIAGNOSIS — M810 Age-related osteoporosis without current pathological fracture: Secondary | ICD-10-CM | POA: Diagnosis not present

## 2018-06-29 ENCOUNTER — Encounter: Payer: Self-pay | Admitting: Gynecology

## 2018-07-23 DIAGNOSIS — M79604 Pain in right leg: Secondary | ICD-10-CM | POA: Diagnosis not present

## 2018-07-23 DIAGNOSIS — M542 Cervicalgia: Secondary | ICD-10-CM | POA: Diagnosis not present

## 2018-07-23 DIAGNOSIS — M545 Low back pain: Secondary | ICD-10-CM | POA: Diagnosis not present

## 2018-07-23 DIAGNOSIS — M5416 Radiculopathy, lumbar region: Secondary | ICD-10-CM | POA: Diagnosis not present

## 2018-07-25 DIAGNOSIS — J3089 Other allergic rhinitis: Secondary | ICD-10-CM | POA: Diagnosis not present

## 2018-08-06 ENCOUNTER — Other Ambulatory Visit: Payer: Self-pay | Admitting: Cardiology

## 2018-08-06 DIAGNOSIS — R0600 Dyspnea, unspecified: Secondary | ICD-10-CM

## 2018-08-06 DIAGNOSIS — I10 Essential (primary) hypertension: Secondary | ICD-10-CM

## 2018-08-06 DIAGNOSIS — E782 Mixed hyperlipidemia: Secondary | ICD-10-CM

## 2018-08-09 DIAGNOSIS — L814 Other melanin hyperpigmentation: Secondary | ICD-10-CM | POA: Diagnosis not present

## 2018-08-09 DIAGNOSIS — L821 Other seborrheic keratosis: Secondary | ICD-10-CM | POA: Diagnosis not present

## 2018-08-10 DIAGNOSIS — M545 Low back pain: Secondary | ICD-10-CM | POA: Diagnosis not present

## 2018-08-10 DIAGNOSIS — M79604 Pain in right leg: Secondary | ICD-10-CM | POA: Diagnosis not present

## 2018-08-10 DIAGNOSIS — M542 Cervicalgia: Secondary | ICD-10-CM | POA: Diagnosis not present

## 2018-08-10 DIAGNOSIS — M5416 Radiculopathy, lumbar region: Secondary | ICD-10-CM | POA: Diagnosis not present

## 2018-08-24 DIAGNOSIS — M79604 Pain in right leg: Secondary | ICD-10-CM | POA: Diagnosis not present

## 2018-08-24 DIAGNOSIS — M5416 Radiculopathy, lumbar region: Secondary | ICD-10-CM | POA: Diagnosis not present

## 2018-08-24 DIAGNOSIS — M542 Cervicalgia: Secondary | ICD-10-CM | POA: Diagnosis not present

## 2018-08-24 DIAGNOSIS — M545 Low back pain: Secondary | ICD-10-CM | POA: Diagnosis not present

## 2018-08-28 DIAGNOSIS — Z23 Encounter for immunization: Secondary | ICD-10-CM | POA: Diagnosis not present

## 2018-09-03 DIAGNOSIS — J3089 Other allergic rhinitis: Secondary | ICD-10-CM | POA: Diagnosis not present

## 2018-09-03 DIAGNOSIS — Z1231 Encounter for screening mammogram for malignant neoplasm of breast: Secondary | ICD-10-CM | POA: Diagnosis not present

## 2018-09-05 DIAGNOSIS — M79604 Pain in right leg: Secondary | ICD-10-CM | POA: Diagnosis not present

## 2018-09-05 DIAGNOSIS — M542 Cervicalgia: Secondary | ICD-10-CM | POA: Diagnosis not present

## 2018-09-05 DIAGNOSIS — M545 Low back pain: Secondary | ICD-10-CM | POA: Diagnosis not present

## 2018-09-05 DIAGNOSIS — M5416 Radiculopathy, lumbar region: Secondary | ICD-10-CM | POA: Diagnosis not present

## 2018-09-24 DIAGNOSIS — M545 Low back pain: Secondary | ICD-10-CM | POA: Diagnosis not present

## 2018-09-24 DIAGNOSIS — M542 Cervicalgia: Secondary | ICD-10-CM | POA: Diagnosis not present

## 2018-09-27 DIAGNOSIS — J3089 Other allergic rhinitis: Secondary | ICD-10-CM | POA: Diagnosis not present

## 2018-10-01 DIAGNOSIS — Z419 Encounter for procedure for purposes other than remedying health state, unspecified: Secondary | ICD-10-CM | POA: Diagnosis not present

## 2018-10-16 DIAGNOSIS — M542 Cervicalgia: Secondary | ICD-10-CM | POA: Diagnosis not present

## 2018-10-16 DIAGNOSIS — M545 Low back pain: Secondary | ICD-10-CM | POA: Diagnosis not present

## 2018-10-16 DIAGNOSIS — M47817 Spondylosis without myelopathy or radiculopathy, lumbosacral region: Secondary | ICD-10-CM | POA: Diagnosis not present

## 2018-10-29 DIAGNOSIS — J3089 Other allergic rhinitis: Secondary | ICD-10-CM | POA: Diagnosis not present

## 2018-10-30 DIAGNOSIS — K589 Irritable bowel syndrome without diarrhea: Secondary | ICD-10-CM | POA: Diagnosis not present

## 2018-10-30 DIAGNOSIS — Z1389 Encounter for screening for other disorder: Secondary | ICD-10-CM | POA: Diagnosis not present

## 2018-10-30 DIAGNOSIS — Z0000 Encounter for general adult medical examination without abnormal findings: Secondary | ICD-10-CM | POA: Diagnosis not present

## 2018-10-30 DIAGNOSIS — I10 Essential (primary) hypertension: Secondary | ICD-10-CM | POA: Diagnosis not present

## 2018-10-30 DIAGNOSIS — E7800 Pure hypercholesterolemia, unspecified: Secondary | ICD-10-CM | POA: Diagnosis not present

## 2018-10-30 DIAGNOSIS — M859 Disorder of bone density and structure, unspecified: Secondary | ICD-10-CM | POA: Diagnosis not present

## 2018-10-31 ENCOUNTER — Telehealth: Payer: Self-pay | Admitting: *Deleted

## 2018-10-31 DIAGNOSIS — M545 Low back pain: Secondary | ICD-10-CM | POA: Diagnosis not present

## 2018-10-31 DIAGNOSIS — M542 Cervicalgia: Secondary | ICD-10-CM | POA: Diagnosis not present

## 2018-10-31 NOTE — Telephone Encounter (Signed)
Patient called and left message stating she spoke with PCP regarding bone density and possibility starting medication . Patient wanted to discuss with Dr. Phineas Real, I called and left a message relaying best to schedule a visit.

## 2018-11-01 ENCOUNTER — Other Ambulatory Visit: Payer: Self-pay | Admitting: Cardiology

## 2018-11-01 ENCOUNTER — Encounter: Payer: Self-pay | Admitting: Cardiology

## 2018-11-01 ENCOUNTER — Ambulatory Visit (INDEPENDENT_AMBULATORY_CARE_PROVIDER_SITE_OTHER): Payer: Medicare Other | Admitting: Cardiology

## 2018-11-01 VITALS — BP 122/62 | HR 67 | Ht 64.0 in | Wt 110.0 lb

## 2018-11-01 DIAGNOSIS — I10 Essential (primary) hypertension: Secondary | ICD-10-CM

## 2018-11-01 DIAGNOSIS — R0602 Shortness of breath: Secondary | ICD-10-CM

## 2018-11-01 DIAGNOSIS — R072 Precordial pain: Secondary | ICD-10-CM

## 2018-11-01 DIAGNOSIS — E782 Mixed hyperlipidemia: Secondary | ICD-10-CM

## 2018-11-01 DIAGNOSIS — R002 Palpitations: Secondary | ICD-10-CM

## 2018-11-01 DIAGNOSIS — R0600 Dyspnea, unspecified: Secondary | ICD-10-CM

## 2018-11-01 NOTE — Progress Notes (Signed)
11/01/2018 Madison Kramer   1946-03-08  [id number]  Primary Physician [name], MD Primary Cardiologist: Dr. Meda Coffee (formerly Dr. Aundra Dubin)  Reason for Visit/CC: Dyspnea   HPI:  Madison Kramer is a 72 y.o. female who is being seen today for the evaluation of dyspnea. She is a former patient of Dr. Claris Gladden. Her PMH is notable for atypical chest pain and palpitations. She has undergone ischemic w/u. She had a Cardiolite NST in 3/11 that was normal and had a repeat study 5/17 that was also normal with no ischemia of infarction. 2D echo in 2014 was normal with normal LVEF. She also has treated HTN and HLD. She is on losartan and Lipitor. She is very active for her age. She still works. She is a Forensic psychologist. Also exercises daily (swimming, treadmill, stair climber, elliptical and light weight training). She also notes chronic allergies (dust mites and mold). 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  06/30/2017 - this is 3 months follow-up, the patient states that she has been feeling great she continues to swing Treadmill shortness of breath or chest pain unless she is doing very strenuous activity. She denies any palpitations no syncope no lower extremity edema orthopnea. She has no side effects with Lipitor. She has been seeing nutritionist for IBS and she recommended to decrease dose of atorvastatin if possible. She has been following strict diet lactose-free and gluten-free with significant improvement with her IBS.  11/01/2018 -this is 1 year follow-up, she has had an episode of palpitations in the middle of the night when she called EMS but EKG was normal and at the time when EMS arrived she had normal heart rate and did not go to ER.  No recurrence in the last 5 months.  She continues to be active and has noticed shortness of breath, she stopped taking atorvastatin as she was experiencing muscle pain.   Current Meds  Medication Sig  .  Calcium Carbonate-Vitamin D (CALCIUM + D PO) Take 2 tablets by mouth daily.   . Cholecalciferol (VITAMIN D PO) Take 1 tablet by mouth daily. PATIENT NOT SURE OF DOSAGE  . fluticasone (FLONASE) 50 MCG/ACT nasal spray Place 1 spray into both nostrils daily.  Marland Kitchen losartan (COZAAR) 25 MG tablet Take 1 tablet (25 mg total) by mouth daily.  . Magnesium 250 MG TABS Take 250 mg by mouth daily.  . Multiple Vitamin (MULTIVITAMIN) capsule Take 1 capsule by mouth daily.    . pantoprazole (PROTONIX) 40 MG tablet Take 1 tablet by mouth every morning.  . Probiotic Product (PROBIOTIC DAILY PO) Take 1 tablet by mouth daily.  . TURMERIC PO Take 1 tablet by mouth daily.   Allergies  Allergen Reactions  . Atorvastatin Other (See Comments)    Pt reports causes lower extremity muscle cramps  . Codeine Nausea Only  . Erythromycin Other (See Comments)    Stomach cramping  . Sulfa Antibiotics     As a child  . Sulfamethoxazole Other (See Comments)    Unsdure of reaction. Had as a child.  . Sulfonamide Derivatives   . Tetracyclines & Related     unknown  . Tramadol Nausea Only and Other (See Comments)    Dizziness and mind racing   Past Medical History:  Diagnosis Date  . Allergic rhinitis   . Anxiety   . Arthritis   . Asthma   . Atypical chest pain    Possibly from DES  .  Back pain   . Cataract    Hx; of  . Diverticulosis    Left  . Elevated cholesterol   . GERD (gastroesophageal reflux disease)   . Hemoptysis 2007   From reflux pharyngitis  . History of nuclear stress test    Myoview 5/17: EF 70%, no scar or ischemia, excellent exercise capacity, low risk  . Hypertension   . Insomnia   . Lactose intolerance   . Lichen sclerosus et atrophicus of the vulva 2008   Biopsy-proven  . Nocturia   . Osteoporosis 10/2015   T score -2.5 left femoral neck  . Pneumonia   . Raynaud's syndrome    "possible"   Family History  Problem Relation Age of Onset  . Heart disease Mother        died at 39;  ? MI  . Hypertension Mother   . Diabetes Mother   . Multiple myeloma Father   . Heart disease Paternal Grandfather        age 71 - died of MI  . Breast cancer Maternal Aunt        Age 49's  . Heart disease Maternal Uncle    Past Surgical History:  Procedure Laterality Date  . BREAST SURGERY  2001   REDUCTION MAMMAPLASTY  . COLONOSCOPY    . FOOT SURGERY  2009  . HYSTEROSCOPY  2003   HYSTEROSCOPIC RESECTION OF MYOMA  . INGUINAL HERNIA REPAIR  1971   BILATERAL  . LUMBAR LAMINECTOMY/DECOMPRESSION MICRODISCECTOMY Right 08/30/2013   Procedure: Right Lumbar four-five Extraforaminal Diskectomy;  Surgeon: Hosie Spangle, MD;  Location: [facility];  Service: Neurosurgery;  Laterality: Right;  . MANDIBLE SURGERY    . MYOMECTOMY    . TONSILLECTOMY    . TUBAL LIGATION  1987   Social History   Socioeconomic History  . Marital status: Divorced    Spouse name: Not on file  . Number of children: 2  . Years of education: Not on file  . Highest education level: Not on file  Occupational History  . Occupation: Architectural technologist: [employer]  . Financial resource strain: Not on file  . Food insecurity:    Worry: Not on file    Inability: Not on file  . Transportation needs:    Medical: Not on file    Non-medical: Not on file  Tobacco Use  . Smoking status: Former Smoker    Years: 20.00    Last attempt to quit: 12/12/1988    Years since quitting: 29.9  . Smokeless tobacco: Never Used  Substance and Sexual Activity  . Alcohol use: Yes    Alcohol/week: 7.0 standard drinks    Types: 7 Standard drinks or equivalent per week    Comment: 7 glasses  . Drug use: No  . Sexual activity: Never    Birth control/protection: Post-menopausal    Comment: 1st intercourse 72 yo-Fewer than 5 partners  Lifestyle  . Physical activity:    Days per week: Not on file    Minutes per session: Not on file  . Stress: Not on file  Relationships  . Social connections:    Talks on  phone: Not on file    Gets together: Not on file    Attends religious service: Not on file    Active member of club or organization: Not on file    Attends meetings of clubs or organizations: Not on file    Relationship status: Not on file  .  Intimate partner violence:    Fear of current or ex partner: Not on file    Emotionally abused: Not on file    Physically abused: Not on file    Forced sexual activity: Not on file  Other Topics Concern  . Not on file  Social History Narrative  . Not on file     Review of Systems: General: negative for chills, fever, night sweats or weight changes.  Cardiovascular: negative for chest pain, dyspnea on exertion, edema, orthopnea, palpitations, paroxysmal nocturnal dyspnea or shortness of breath Dermatological: negative for rash Respiratory: negative for cough or wheezing Urologic: negative for hematuria Abdominal: negative for nausea, vomiting, diarrhea, bright red blood per rectum, melena, or hematemesis Neurologic: negative for visual changes, syncope, or dizziness All other systems reviewed and are otherwise negative except as noted above.   Physical Exam:  Height _0  (1.626 m).  General appearance: alert, cooperative, no distress and slighlty anxious Neck: no carotid bruit and no JVD Lungs: clear to auscultation bilaterally Heart: regular rate and rhythm, S1, S2 normal, no murmur, click, rub or gallop Extremities: extremities normal, atraumatic, no cyanosis or edema Pulses: 2+ and symmetric Skin: Skin color, texture, turgor normal. No rashes or lesions Neurologic: Grossly normal  EKG  ectopic atrial rhythm. Heart rate 67 bpm -- personally reviewed     ASSESSMENT AND PLAN:   1. Shortness of breath family history of premature coronary artery disease in her mother, she also has now untreated hyperlipidemia, and she is a former smoker, we will obtain coronary CTA to further evaluate.  2. Hypertension - well controlled, we will  continue losartan to 25 mg daily.  3. Hyperlipidemia-she stop using statin, she was intolerant to atorvastatin, her LDL at her PCP office on October 22, 2018 was 152, HDL 74 and triglycerides 130.  Follow-up in 6 months.    Ena Dawley , MD [facility] 11/01/2018 8:37 AM

## 2018-11-01 NOTE — Patient Instructions (Signed)
Medication Instructions:   Your physician recommends that you continue on your current medications as directed. Please refer to the Current Medication list given to you today.  If you need a refill on your cardiac medications before your next appointment, please call your pharmacy.    Lab work:  BMET--THIS WILL BE ARRANGED PRIOR TO WHEN YOUR CORONARY CT IS SCHEDULED   If you have labs (blood work) drawn today and your tests are completely normal, you will receive your results only by: Marland Kitchen MyChart Message (if you have MyChart) OR . A paper copy in the mail If you have any lab test that is abnormal or we need to change your treatment, we will call you to review the results.    Testing/Procedures:   CORONARY CT Please arrive at the [facility] main entrance of [facility] at xx:xx AM (30-45 minutes prior to test start time)  [facility] [address], [place] [address] [phone]  Proceed to the [facility] Radiology Department (First Floor).  Please follow these instructions carefully (unless otherwise directed):   On the Night Before the Test: . Be sure to Drink plenty of water. . Do not consume any caffeinated/decaffeinated beverages or chocolate 12 hours prior to your test. . Do not take any antihistamines 12 hours prior to your test.   On the Day of the Test: . Drink plenty of water. Do not drink any water within one hour of the test. . Do not eat any food 4 hours prior to the test. . You may take your regular medications prior to the test.        After the Test: . Drink plenty of water. . After receiving IV contrast, you may experience a mild flushed feeling. This is normal. . On occasion, you may experience a mild rash up to 24 hours after the test. This is not dangerous. If this occurs, you can take Benadryl 25 mg and increase your fluid intake. . If you experience trouble breathing, this can be serious. If it is severe call 911  IMMEDIATELY. If it is mild, please call our office.    Follow-Up: At [facility], you and your health needs are our priority.  As part of our continuing mission to provide you with exceptional heart care, we have created designated Provider Care Teams.  These Care Teams include your primary Cardiologist (physician) and Advanced Practice Providers (APPs -  Physician Assistants and Nurse Practitioners) who all work together to provide you with the care you need, when you need it. You will need a follow up appointment in 6 months.  Please call our office 2 months in advance to schedule this appointment.  You may see Ena Dawley, MD or one of the following Advanced Practice Providers on your designated Care Team:   [place], PA-C Melina Copa, PA-C . Ermalinda Barrios, PA-C

## 2018-11-05 DIAGNOSIS — M542 Cervicalgia: Secondary | ICD-10-CM | POA: Diagnosis not present

## 2018-11-05 DIAGNOSIS — M25511 Pain in right shoulder: Secondary | ICD-10-CM | POA: Diagnosis not present

## 2018-11-13 DIAGNOSIS — M542 Cervicalgia: Secondary | ICD-10-CM | POA: Diagnosis not present

## 2018-11-13 DIAGNOSIS — M545 Low back pain: Secondary | ICD-10-CM | POA: Diagnosis not present

## 2018-11-21 DIAGNOSIS — M25511 Pain in right shoulder: Secondary | ICD-10-CM | POA: Diagnosis not present

## 2018-11-21 DIAGNOSIS — M545 Low back pain: Secondary | ICD-10-CM | POA: Diagnosis not present

## 2018-11-21 DIAGNOSIS — M542 Cervicalgia: Secondary | ICD-10-CM | POA: Diagnosis not present

## 2018-11-23 ENCOUNTER — Encounter: Payer: Self-pay | Admitting: Gynecology

## 2018-11-23 ENCOUNTER — Ambulatory Visit (INDEPENDENT_AMBULATORY_CARE_PROVIDER_SITE_OTHER): Payer: Medicare Other | Admitting: Gynecology

## 2018-11-23 VITALS — BP 118/74

## 2018-11-23 DIAGNOSIS — M810 Age-related osteoporosis without current pathological fracture: Secondary | ICD-10-CM

## 2018-11-23 NOTE — Progress Notes (Signed)
    Madison Kramer Apr 11, 1946 [id number]        72 y.o.  G2P2002 presents to discuss her bone density from July which shows osteoporosis.  Has been followed through Dr. Delene Ruffini office.  History of bisphosphate a number of years ago for approximately 5 to 7 years but discontinued roughly 10 years ago.  Most recent DEXA shows T score -2.7 right and left femoral necks.  Prior left femoral neck 2016 was -2.5.  The patient is very active and exercises on a daily basis.  Supplements calcium and vitamin D and overall lives a healthy lifestyle.  Past medical history,surgical history, problem list, medications, allergies, family history and social history were all reviewed and documented in the EPIC chart.  Directed ROS with pertinent positives and negatives documented in the history of present illness/assessment and plan.  Exam: Vitals:   11/23/18 1222  BP: 118/74   General appearance:  Normal   Assessment/Plan:  72 y.o. G2P2002 with osteoporosis at left and right femoral neck.  We reviewed her bone density in detail.  We discussed her prior use of bisphosphate and although she has not used it for a long time whether she is gaining any benefit from her prior use.  Options to reinitiate medical treatment to include bisphosphate's as well as Prolia was discussed.  Risks to include GERD, osteonecrosis of the jaw, atypical fractures, infections and rashes were reviewed.  Whether going from a -2.5 to -2.7 is a statistically significant change and whether that would make a difference regardless given a value of -2.7 and her current risk of fracture.  She is active, stable on her feet and otherwise healthy.  The patient is unsure whether she wants to consider medication or not.  I did recommend checking a baseline vitamin D level today.  I also suggested a consultation with Dr Cruzita Lederer to review her information and get her input and the patient is very receptive to this.  We will go ahead and make those  arrangements for her.    [name] MD, 1:47 PM 11/23/2018

## 2018-11-23 NOTE — Patient Instructions (Signed)
Office will call to make arrangements to see Dr Cruzita Lederer in follow-up of your osteoporosis.

## 2018-11-24 LAB — VITAMIN D 25 HYDROXY (VIT D DEFICIENCY, FRACTURES): Vit D, 25-Hydroxy: 42 ng/mL (ref 30–100)

## 2018-11-26 ENCOUNTER — Encounter: Payer: Self-pay | Admitting: Gynecology

## 2018-11-26 ENCOUNTER — Telehealth: Payer: Self-pay | Admitting: *Deleted

## 2018-11-26 DIAGNOSIS — J3089 Other allergic rhinitis: Secondary | ICD-10-CM | POA: Diagnosis not present

## 2018-11-26 DIAGNOSIS — M818 Other osteoporosis without current pathological fracture: Secondary | ICD-10-CM

## 2018-11-26 NOTE — Telephone Encounter (Signed)
-----   Message from [name], MD sent at 11/23/2018  1:52 PM EST ----- Range appointment with Dr Cruzita Lederer reference osteoporosis

## 2018-11-26 NOTE — Telephone Encounter (Signed)
Referral placed in epic and [facility] at [facility] they will call to schedule.

## 2018-11-27 DIAGNOSIS — M25511 Pain in right shoulder: Secondary | ICD-10-CM | POA: Diagnosis not present

## 2018-11-27 NOTE — Telephone Encounter (Signed)
Appointment 12/14/18 with Dr. Renne Crigler.

## 2018-11-28 ENCOUNTER — Telehealth (HOSPITAL_COMMUNITY): Payer: Self-pay | Admitting: Emergency Medicine

## 2018-11-28 NOTE — Telephone Encounter (Signed)
Reaching out to patient to offer assistance regarding upcoming cardiac imaging study; unable to reach patient but left name and call back number provided for further questions should they arise Marchia Bond RN Navigator Cardiac Imaging [phone]

## 2018-11-29 DIAGNOSIS — M545 Low back pain: Secondary | ICD-10-CM | POA: Diagnosis not present

## 2018-11-29 DIAGNOSIS — M542 Cervicalgia: Secondary | ICD-10-CM | POA: Diagnosis not present

## 2018-11-29 DIAGNOSIS — M25511 Pain in right shoulder: Secondary | ICD-10-CM | POA: Diagnosis not present

## 2018-11-30 ENCOUNTER — Ambulatory Visit (HOSPITAL_COMMUNITY)
Admission: RE | Admit: 2018-11-30 | Discharge: 2018-11-30 | Disposition: A | Discharge status: Home / Self Care | Payer: Medicare Other | Source: Ambulatory Visit | Attending: Cardiology | Admitting: Cardiology

## 2018-11-30 DIAGNOSIS — R0602 Shortness of breath: Secondary | ICD-10-CM | POA: Insufficient documentation

## 2018-11-30 DIAGNOSIS — I10 Essential (primary) hypertension: Secondary | ICD-10-CM

## 2018-11-30 DIAGNOSIS — R072 Precordial pain: Secondary | ICD-10-CM

## 2018-11-30 DIAGNOSIS — R002 Palpitations: Secondary | ICD-10-CM | POA: Insufficient documentation

## 2018-11-30 DIAGNOSIS — E782 Mixed hyperlipidemia: Secondary | ICD-10-CM

## 2018-11-30 LAB — POCT I-STAT CREATININE: Creatinine, Ser: 0.8 mg/dL (ref 0.44–1.00)

## 2018-11-30 MED ORDER — IOPAMIDOL (ISOVUE-370) INJECTION 76%
100.0000 mL | Freq: Once | INTRAVENOUS | Status: AC | PRN
  Administered 2018-11-30: 100 mL via INTRAVENOUS

## 2018-11-30 MED ORDER — NITROGLYCERIN 0.4 MG SL SUBL
SUBLINGUAL_TABLET | SUBLINGUAL | Status: AC
  Filled 2018-11-30: qty 2

## 2018-12-03 ENCOUNTER — Telehealth: Payer: Self-pay | Admitting: Cardiology

## 2018-12-03 DIAGNOSIS — I25119 Atherosclerotic heart disease of native coronary artery with unspecified angina pectoris: Secondary | ICD-10-CM

## 2018-12-03 DIAGNOSIS — E782 Mixed hyperlipidemia: Secondary | ICD-10-CM

## 2018-12-03 DIAGNOSIS — I2510 Atherosclerotic heart disease of native coronary artery without angina pectoris: Secondary | ICD-10-CM | POA: Insufficient documentation

## 2018-12-03 MED ORDER — ROSUVASTATIN CALCIUM 10 MG PO TABS: 10.0000 mg | ORAL_TABLET | Freq: Every day | ORAL | 1 refills | Status: DC

## 2018-12-03 NOTE — Telephone Encounter (Signed)
Follow up ° ° °Patient is returning call for CT Results. °

## 2018-12-03 NOTE — Telephone Encounter (Signed)
Spoke with the pt and endorsed to her, her Coronary CT results and recommendations per Dr Meda Coffee.  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  Endorsed to the pt that Dr Meda Coffee recommends that we start her on rosuvastatin 10 mg po daily, in addition to her fish oil she's taking,  and repeat her lipids and a CMET in one month.  Informed the pt that per Dr Meda Coffee, if she does not tolerate statins, we will refer her for PCSK9-Inhibitors. Confirmed the pharmacy of choice with the pt. Informed the pt that I will only send in a month supply of her rosuvastatin, to make sure she tolerates this appropriately.  Scheduled the pts repeat lipids and CMET for one month out on 01/03/2019.  Pt is aware to come fasting to this lab appt. Pt verbalized understanding and agrees with this plan.

## 2018-12-03 NOTE — Telephone Encounter (Signed)
Pt is calling in for her Coronary CT results, from where she had this test done last Friday.  Pt is aware that this is currently in Dr Francesca Oman in-basket for further review and interpretation.  FFR was also sent off.  Pt will get a call back once Dr Meda Coffee further interprets her CT results.  Will send this message to Dr Meda Coffee as an Juluis Rainier, that pt is calling, inquiring about her Coronary CT results.

## 2018-12-03 NOTE — Telephone Encounter (Signed)
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  If she does not tolerate statins we will refer her for PCSK9 inhibitors.

## 2018-12-03 NOTE — Telephone Encounter (Signed)
New Message     Pt had Ct scan on Friday at [facility], please call with results. Pt requested to be called back today

## 2018-12-03 NOTE — Telephone Encounter (Signed)
Left the pt a message to call the office back to endorse Coronary CT results and recommendations per Dr Meda Coffee.

## 2018-12-07 DIAGNOSIS — S83241A Other tear of medial meniscus, current injury, right knee, initial encounter: Secondary | ICD-10-CM | POA: Diagnosis not present

## 2018-12-11 DIAGNOSIS — M25561 Pain in right knee: Secondary | ICD-10-CM | POA: Diagnosis not present

## 2018-12-13 DIAGNOSIS — R262 Difficulty in walking, not elsewhere classified: Secondary | ICD-10-CM | POA: Diagnosis not present

## 2018-12-13 DIAGNOSIS — R531 Weakness: Secondary | ICD-10-CM | POA: Diagnosis not present

## 2018-12-13 DIAGNOSIS — S83511D Sprain of anterior cruciate ligament of right knee, subsequent encounter: Secondary | ICD-10-CM | POA: Diagnosis not present

## 2018-12-13 DIAGNOSIS — S83241A Other tear of medial meniscus, current injury, right knee, initial encounter: Secondary | ICD-10-CM | POA: Diagnosis not present

## 2018-12-13 DIAGNOSIS — S83281A Other tear of lateral meniscus, current injury, right knee, initial encounter: Secondary | ICD-10-CM | POA: Diagnosis not present

## 2018-12-13 DIAGNOSIS — M25561 Pain in right knee: Secondary | ICD-10-CM | POA: Diagnosis not present

## 2018-12-14 ENCOUNTER — Encounter: Payer: Self-pay | Admitting: Internal Medicine

## 2018-12-14 ENCOUNTER — Ambulatory Visit (INDEPENDENT_AMBULATORY_CARE_PROVIDER_SITE_OTHER): Payer: Medicare Other | Admitting: Internal Medicine

## 2018-12-14 VITALS — BP 128/70 | HR 68 | Ht 64.0 in | Wt 110.0 lb

## 2018-12-14 DIAGNOSIS — M810 Age-related osteoporosis without current pathological fracture: Secondary | ICD-10-CM

## 2018-12-14 NOTE — Progress Notes (Signed)
Patient ID: Madison Kramer, female   DOB: Jun 01, 1946, 73 y.o.   MRN: [id number]    HPI  Madison Kramer is a 73 y.o.-year-old female, referred by Dr. Phineas Real, for management of osteoporosis.  Pt was dx with OP in 06/2018.  I reviewed pt's latest 3  DXA scans: Date L1-L4 T score FN T score UD radius 33% Distal radius  06/26/2018 -1.5 RFN: -2.7 LFN: -2.5 -3.0 -2.7  11/11/2015 -1.6 RFN: n/a LFN: -2.3 n/a n/a  11/15/2011 -1.3 RFN: n/a LFN: -2.4 n/a n/a    No dizziness/vertigo/orthostasis/poor vision.   She had a L ankle fx  and a tarsal bone in R foot in 1990s.  No recent fractures.  She did have a recent fall.  She fell over a dog fence and hit R knee 2 weeks ago  >> torn ACL - in crutches now.  Previous OP treatments:  - Actonel: 2004-2007 - Boniva 2007-2009  No h/o vitamin D deficiency. Reviewed available vit D levels: Lab Results  Component Value Date   VD25OH 42 11/23/2018   VD25OH 41 02/17/2016   Pt is on calcium 1200 mg daily; also takes a vitamin D supplement (?  Dose)- 2x a day    She is very active and exercises consistently. + weight bearing exercises. She uses different machines, also walks on the treadmill and swims.  She was previously doing yoga, not anymore.  She also was running in the past.  She does not take high vitamin A doses.  Menopause was at 73 y/o.   No FH of osteoporosis.  No h/o hyper/hypocalcemia or hyperparathyroidism. No h/o kidney stones. Lab Results  Component Value Date   CALCIUM 8.9 07/03/2017   CALCIUM 9.4 08/28/2013   No h/o thyrotoxicosis. Reviewed TSH recent levels:  Lab Results  Component Value Date   TSH 1.230 07/03/2017   No h/o CKD. Last BUN/Cr: Lab Results  Component Value Date   BUN 31 (H) 07/03/2017   CREATININE 0.80 11/30/2018   She has acid reflux and IBS.   She also has a history of HTN and HL.  She uses CBD oil occasionally.  ROS: Constitutional: + Weight loss, no fatigue, no subjective  hyperthermia/hypothermia, + nocturia and excessive urination, + poor sleep Eyes: no blurry vision, no xerophthalmia ENT: no sore throat, no nodules palpated in throat, no dysphagia/odynophagia, no hoarseness Cardiovascular: no CP/SOB/palpitations/leg swelling Respiratory: no cough/SOB Gastrointestinal: no N/V/D/ + C/+ heartburn Musculoskeletal: no muscle/+ joint aches Skin: no rashes, + hair loss Neurological: no tremors/numbness/tingling/dizziness Psychiatric: no depression/anxiety + Low libido  Past Medical History:  Diagnosis Date  . Allergic rhinitis   . Anxiety   . Arthritis   . Asthma   . Atypical chest pain    Possibly from DES  . Back pain   . Cataract    Hx; of  . Diverticulosis    Left  . Elevated cholesterol   . GERD (gastroesophageal reflux disease)   . Hemoptysis 2007   From reflux pharyngitis  . History of nuclear stress test    Myoview 5/17: EF 70%, no scar or ischemia, excellent exercise capacity, low risk  . Hypertension   . Insomnia   . Lactose intolerance   . Lichen sclerosus et atrophicus of the vulva 2008   Biopsy-proven  . Nocturia   . Osteoporosis 10/2015   T score -2.5 left femoral neck  . Pneumonia   . Raynaud's syndrome    "possible"   Past Surgical History:  Procedure Laterality  Date  . BREAST SURGERY  2001   REDUCTION MAMMAPLASTY  . COLONOSCOPY    . FOOT SURGERY  2009  . HYSTEROSCOPY  2003   HYSTEROSCOPIC RESECTION OF MYOMA  . INGUINAL HERNIA REPAIR  1971   BILATERAL  . LUMBAR LAMINECTOMY/DECOMPRESSION MICRODISCECTOMY Right 08/30/2013   Procedure: Right Lumbar four-five Extraforaminal Diskectomy;  Surgeon: Hosie Spangle, MD;  Location: [facility];  Service: Neurosurgery;  Laterality: Right;  . MANDIBLE SURGERY    . MYOMECTOMY    . TONSILLECTOMY    . TUBAL LIGATION  1987   Social History   Socioeconomic History  . Marital status: Divorced    Spouse name: Not on file  . Number of children: 2  . Years of education: Not on  file  . Highest education level: Not on file  Occupational History  . Occupation: Architectural technologist: [employer]  . Financial resource strain: Not on file  . Food insecurity:    Worry: Not on file    Inability: Not on file  . Transportation needs:    Medical: Not on file    Non-medical: Not on file  Tobacco Use  . Smoking status: Former Smoker    Years: 20.00    Last attempt to quit: 12/12/1988    Years since quitting: 30.0  . Smokeless tobacco: Never Used  Substance and Sexual Activity  . Alcohol use: Yes    Alcohol/week: 7.0 standard drinks    Types: 7 Standard drinks or equivalent per week    Comment: 7 glasses  . Drug use: No  . Sexual activity: Never    Birth control/protection: Post-menopausal    Comment: 1st intercourse 73 yo-Fewer than 5 partners  Lifestyle  . Physical activity:    Days per week: Not on file    Minutes per session: Not on file  . Stress: Not on file  Relationships  . Social connections:    Talks on phone: Not on file    Gets together: Not on file    Attends religious service: Not on file    Active member of club or organization: Not on file    Attends meetings of clubs or organizations: Not on file    Relationship status: Not on file  . Intimate partner violence:    Fear of current or ex partner: Not on file    Emotionally abused: Not on file    Physically abused: Not on file    Forced sexual activity: Not on file  Other Topics Concern  . Not on file  Social History Narrative  . Not on file   Current Outpatient Medications on File Prior to Visit  Medication Sig Dispense Refill  . Calcium Carbonate-Vitamin D (CALCIUM + D PO) Take 1,200 mg by mouth daily.     . Cholecalciferol (VITAMIN D PO) Take 2,000 Units by mouth daily.     . fluticasone (FLONASE) 50 MCG/ACT nasal spray Place 1 spray into both nostrils daily.    . Glucosamine-Chondroitin-MSM 500-200-150 MG TABS Take 1 tablet by mouth 2 (two) times daily.    Marland Kitchen losartan  (COZAAR) 25 MG tablet TAKE 1 TABLET ONCE DAILY. 90 tablet 3  . Magnesium 250 MG TABS Take 250 mg by mouth daily.    . Multiple Vitamin (MULTIVITAMIN) capsule Take 1 capsule by mouth daily.      . Omega-3 Fatty Acids (FISH OIL PO) Take 2 capsules by mouth at bedtime.    . pantoprazole (PROTONIX)  40 MG tablet Take 1 tablet by mouth every morning. 90 tablet 3  . Probiotic Product (PROBIOTIC DAILY PO) Take 1 tablet by mouth daily.    . rosuvastatin (CRESTOR) 10 MG tablet Take 1 tablet (10 mg total) by mouth daily. 30 tablet 1  . TURMERIC PO Take 1 tablet by mouth daily.     No current facility-administered medications on file prior to visit.    Allergies  Allergen Reactions  . Atorvastatin Other (See Comments)    Pt reports causes lower extremity muscle cramps  . Codeine Nausea Only  . Erythromycin Other (See Comments)    Stomach cramping  . Sulfa Antibiotics     As a child  . Sulfamethoxazole Other (See Comments)    Unsdure of reaction. Had as a child.  . Sulfonamide Derivatives   . Tetracyclines & Related     unknown  . Tramadol Nausea Only and Other (See Comments)    Dizziness and mind racing   Family History  Problem Relation Age of Onset  . Heart disease Mother        died at 61; ? MI  . Hypertension Mother   . Diabetes Mother   . Multiple myeloma Father   . Heart disease Paternal Grandfather        age 9 - died of MI  . Breast cancer Maternal Aunt        Age 47's  . Heart disease Maternal Uncle     PE: BP 128/70   Pulse 68   Ht '5\' 4"'  (1.626 m) Comment: measured  Wt 110 lb (49.9 kg)   BMI 18.88 kg/m  Wt Readings from Last 3 Encounters:  12/14/18 110 lb (49.9 kg)  11/01/18 110 lb (49.9 kg)  04/16/18 109 lb (49.4 kg)   Constitutional: thin, in NAD. No kyphosis.  In crutches.  Right knee in brace. Eyes: PERRLA, EOMI, no exophthalmos ENT: moist mucous membranes, no thyromegaly, no cervical lymphadenopathy Cardiovascular: RRR, No MRG Respiratory: CTA  B Gastrointestinal: abdomen soft, NT, ND, BS+ Musculoskeletal: no deformities, strength intact in all 4 Skin: moist, warm, no rashes Neurological: no tremor with outstretched hands, DTR normal in all 4  Assessment: 1. Osteoporosis  Plan: 1. Osteoporosis - likely age-related and postmenopausal, no known FH of OP - Discussed about increased risk of fracture, depending on the T score, greatly increased when the T score is lower than -2.5, but it is actually a continuum and -2.5 should not be regarded as an absolute threshold. We reviewed her DXA scan reports together, and I explained that based on the T scores, she has an increased risk for fractures, especially looking at the T-scores from 2019. - we reviewed her dietary and supplemental calcium and vitamin D intake. I recommended to make sure she gets 1000-1200 mg of calcium daily preferentially from diet.  She is now getting 1200 mg of calcium only from supplements and I advised her to reduce this.  We did discuss about improving diet by promoting mostly alkaline diet.  I recommended the following book for help thing on a low acid diet:  -Of note, she had a normal vitamin D recently.  She continues on her vitamin D supplement but unclear which dose.  I advised her to let me know when she returns next time. - discussed fall precautions   - She is exercising consistently and she is doing weightbearing exercises.  However, after she torn her ACL, she is in PT. - I also recommended the  [facility] - for skeletal loading. Given brochure and explained benefits. - we discussed about maintaining a good amount of protein in her diet. The recommended daily protein intake is ~0.8 g per kilogram per day (for her approximately 40 mg daily. I advised her to try to aim for this amount, since a diet low in proteins can exacerbate osteoporosis. Also, avoid smoking or >2 drinks of alcohol a day. - We discussed about the different medication classes,  benefits and side effects (including atypical fractures and ONJ - no dental workup in progress or planned).  She was previously on bisphosphonates but I would suggest these as first-line for now since she does have GERD. She is very reticent to restart medications due to the possible side effects.  I explained that the benefit/risk ratio is significantly weighted towards benefit. - For now, we will continue to follow her expectantly and repeat another DXA scan in 2 years: unchanged or slightly higher T-scores are desirable - will see pt back in a year  Philemon Kingdom, MD PhD [facility] Endocrinology

## 2018-12-14 NOTE — Patient Instructions (Addendum)
Please make sure you get 1000-1200 mg calcium a day from the diet. If you do not take this from the diet, you can supplement no more than 600 mg per day.  Please come back for a follow-up appointment in 1 year.   How Can I Prevent Falls? Men and women with osteoporosis need to take care not to fall down. Falls can break bones. Some reasons people fall are: Poor vision  Poor balance  Certain diseases that affect how you walk  Some types of medicine, such as sleeping pills.  Some tips to help prevent falls outdoors are: Use a cane or walker  Wear rubber-soled shoes so you don't slip  Walk on grass when sidewalks are slippery  In winter, put salt or kitty litter on icy sidewalks.  Some ways to help prevent falls indoors are: Keep rooms free of clutter, especially on floors  Use plastic or carpet runners on slippery floors  Wear low-heeled shoes that provide good support  Do not walk in socks, stockings, or slippers  Be sure carpets and area rugs have skid-proof backs or are tacked to the floor  Be sure stairs are well lit and have rails on both sides  Put grab bars on bathroom walls near tub, shower, and toilet  Use a rubber bath mat in the shower or tub  Keep a flashlight next to your bed  Use a sturdy step stool with a handrail and wide steps  Add more lights in rooms (and night lights) Buy a cordless phone to keep with you so that you don't have to rush to the phone       when it rings and so that you can call for help if you fall.   (adapted from http://www.niams.NightlifePreviews.se)  Please check out the following book about best diet for bone health:   Please come back for a follow-up appointment in 1 year.

## 2018-12-17 DIAGNOSIS — M25561 Pain in right knee: Secondary | ICD-10-CM | POA: Diagnosis not present

## 2018-12-17 DIAGNOSIS — S83511D Sprain of anterior cruciate ligament of right knee, subsequent encounter: Secondary | ICD-10-CM | POA: Diagnosis not present

## 2018-12-17 DIAGNOSIS — R531 Weakness: Secondary | ICD-10-CM | POA: Diagnosis not present

## 2018-12-17 DIAGNOSIS — R262 Difficulty in walking, not elsewhere classified: Secondary | ICD-10-CM | POA: Diagnosis not present

## 2018-12-19 DIAGNOSIS — M25561 Pain in right knee: Secondary | ICD-10-CM | POA: Diagnosis not present

## 2018-12-19 DIAGNOSIS — R531 Weakness: Secondary | ICD-10-CM | POA: Diagnosis not present

## 2018-12-19 DIAGNOSIS — S83511D Sprain of anterior cruciate ligament of right knee, subsequent encounter: Secondary | ICD-10-CM | POA: Diagnosis not present

## 2018-12-19 DIAGNOSIS — R262 Difficulty in walking, not elsewhere classified: Secondary | ICD-10-CM | POA: Diagnosis not present

## 2018-12-25 DIAGNOSIS — M25561 Pain in right knee: Secondary | ICD-10-CM | POA: Diagnosis not present

## 2018-12-25 DIAGNOSIS — S83511D Sprain of anterior cruciate ligament of right knee, subsequent encounter: Secondary | ICD-10-CM | POA: Diagnosis not present

## 2018-12-25 DIAGNOSIS — R531 Weakness: Secondary | ICD-10-CM | POA: Diagnosis not present

## 2018-12-25 DIAGNOSIS — R262 Difficulty in walking, not elsewhere classified: Secondary | ICD-10-CM | POA: Diagnosis not present

## 2018-12-26 DIAGNOSIS — M25561 Pain in right knee: Secondary | ICD-10-CM | POA: Diagnosis not present

## 2018-12-26 DIAGNOSIS — R262 Difficulty in walking, not elsewhere classified: Secondary | ICD-10-CM | POA: Diagnosis not present

## 2018-12-26 DIAGNOSIS — S83511D Sprain of anterior cruciate ligament of right knee, subsequent encounter: Secondary | ICD-10-CM | POA: Diagnosis not present

## 2018-12-26 DIAGNOSIS — R531 Weakness: Secondary | ICD-10-CM | POA: Diagnosis not present

## 2018-12-27 DIAGNOSIS — R262 Difficulty in walking, not elsewhere classified: Secondary | ICD-10-CM | POA: Diagnosis not present

## 2018-12-27 DIAGNOSIS — S83511D Sprain of anterior cruciate ligament of right knee, subsequent encounter: Secondary | ICD-10-CM | POA: Diagnosis not present

## 2018-12-27 DIAGNOSIS — M25561 Pain in right knee: Secondary | ICD-10-CM | POA: Diagnosis not present

## 2018-12-27 DIAGNOSIS — R531 Weakness: Secondary | ICD-10-CM | POA: Diagnosis not present

## 2018-12-31 DIAGNOSIS — M25561 Pain in right knee: Secondary | ICD-10-CM | POA: Diagnosis not present

## 2018-12-31 DIAGNOSIS — R262 Difficulty in walking, not elsewhere classified: Secondary | ICD-10-CM | POA: Diagnosis not present

## 2018-12-31 DIAGNOSIS — S83511D Sprain of anterior cruciate ligament of right knee, subsequent encounter: Secondary | ICD-10-CM | POA: Diagnosis not present

## 2018-12-31 DIAGNOSIS — R531 Weakness: Secondary | ICD-10-CM | POA: Diagnosis not present

## 2019-01-01 ENCOUNTER — Telehealth: Payer: Self-pay

## 2019-01-01 NOTE — Telephone Encounter (Signed)
Patient Madison Kramer DOB: [birth year] called requesting to be switched to a tier 1 or tier 2 medication because she can not afford Rosuvastatin which she is currently on and only have enough until this Friday. Please advise

## 2019-01-02 DIAGNOSIS — M25561 Pain in right knee: Secondary | ICD-10-CM | POA: Diagnosis not present

## 2019-01-02 DIAGNOSIS — R262 Difficulty in walking, not elsewhere classified: Secondary | ICD-10-CM | POA: Diagnosis not present

## 2019-01-02 DIAGNOSIS — H1045 Other chronic allergic conjunctivitis: Secondary | ICD-10-CM | POA: Diagnosis not present

## 2019-01-02 DIAGNOSIS — K219 Gastro-esophageal reflux disease without esophagitis: Secondary | ICD-10-CM | POA: Diagnosis not present

## 2019-01-02 DIAGNOSIS — S83511D Sprain of anterior cruciate ligament of right knee, subsequent encounter: Secondary | ICD-10-CM | POA: Diagnosis not present

## 2019-01-02 DIAGNOSIS — R531 Weakness: Secondary | ICD-10-CM | POA: Diagnosis not present

## 2019-01-02 DIAGNOSIS — J4530 Mild persistent asthma, uncomplicated: Secondary | ICD-10-CM | POA: Diagnosis not present

## 2019-01-02 DIAGNOSIS — J3089 Other allergic rhinitis: Secondary | ICD-10-CM | POA: Diagnosis not present

## 2019-01-02 MED ORDER — PRAVASTATIN SODIUM 20 MG PO TABS: 20.0000 mg | ORAL_TABLET | Freq: Every evening | ORAL | 1 refills | Status: DC

## 2019-01-02 NOTE — Telephone Encounter (Signed)
Madison Kramer, can you help with this issue? Thanks!

## 2019-01-02 NOTE — Telephone Encounter (Signed)
Spoke with the pt and endorsed to her that Dr Meda Coffee recommends that we d/c her rosuvastatin, and switch her to pravastatin 20 mg po daily, and  see if that's cheaper through her insurance carrier.  Confirmed the pharmacy of choice with the pt.  Advised the pt to let us know if this med is affordable or not, so then we can ask our prior auth nurse for further assistance with this. Pt verbalized understanding and agrees with this plan.

## 2019-01-02 NOTE — Telephone Encounter (Signed)
Please try pravastatin 20 mg daily and see if that is cheaper for her.

## 2019-01-03 ENCOUNTER — Other Ambulatory Visit: Payer: Medicare Other

## 2019-01-03 DIAGNOSIS — D225 Melanocytic nevi of trunk: Secondary | ICD-10-CM | POA: Diagnosis not present

## 2019-01-03 DIAGNOSIS — L309 Dermatitis, unspecified: Secondary | ICD-10-CM | POA: Diagnosis not present

## 2019-01-03 DIAGNOSIS — L738 Other specified follicular disorders: Secondary | ICD-10-CM | POA: Diagnosis not present

## 2019-01-03 DIAGNOSIS — L821 Other seborrheic keratosis: Secondary | ICD-10-CM | POA: Diagnosis not present

## 2019-01-04 ENCOUNTER — Other Ambulatory Visit: Payer: Medicare Other | Admitting: *Deleted

## 2019-01-04 DIAGNOSIS — I25119 Atherosclerotic heart disease of native coronary artery with unspecified angina pectoris: Secondary | ICD-10-CM | POA: Diagnosis not present

## 2019-01-04 DIAGNOSIS — I2510 Atherosclerotic heart disease of native coronary artery without angina pectoris: Secondary | ICD-10-CM

## 2019-01-04 DIAGNOSIS — E782 Mixed hyperlipidemia: Secondary | ICD-10-CM | POA: Diagnosis not present

## 2019-01-04 LAB — COMPREHENSIVE METABOLIC PANEL
ALT: 24 IU/L (ref 0–32)
AST: 23 IU/L (ref 0–40)
Albumin/Globulin Ratio: 2 (ref 1.2–2.2)
Albumin: 4.2 g/dL (ref 3.7–4.7)
Alkaline Phosphatase: 43 IU/L (ref 39–117)
BUN/Creatinine Ratio: 31 — ABNORMAL HIGH (ref 12–28)
BUN: 21 mg/dL (ref 8–27)
Bilirubin Total: 0.7 mg/dL (ref 0.0–1.2)
CO2: 23 mmol/L (ref 20–29)
Calcium: 9 mg/dL (ref 8.7–10.3)
Chloride: 100 mmol/L (ref 96–106)
Creatinine, Ser: 0.68 mg/dL (ref 0.57–1.00)
GFR calc Af Amer: 101 mL/min/{1.73_m2} (ref >59)
GFR calc non Af Amer: 88 mL/min/{1.73_m2} (ref >59)
Globulin, Total: 2.1 g/dL (ref 1.5–4.5)
Glucose: 87 mg/dL (ref 65–99)
Potassium: 4.3 mmol/L (ref 3.5–5.2)
Sodium: 138 mmol/L (ref 134–144)
Total Protein: 6.3 g/dL (ref 6.0–8.5)

## 2019-01-04 LAB — LIPID PANEL
Chol/HDL Ratio: 2.2 ratio (ref 0.0–4.4)
Cholesterol, Total: 159 mg/dL (ref 100–199)
HDL: 72 mg/dL (ref >39)
LDL Calculated: 67 mg/dL (ref 0–99)
Triglycerides: 98 mg/dL (ref 0–149)
VLDL Cholesterol Cal: 20 mg/dL (ref 5–40)

## 2019-01-07 DIAGNOSIS — S83511D Sprain of anterior cruciate ligament of right knee, subsequent encounter: Secondary | ICD-10-CM | POA: Diagnosis not present

## 2019-01-07 DIAGNOSIS — R262 Difficulty in walking, not elsewhere classified: Secondary | ICD-10-CM | POA: Diagnosis not present

## 2019-01-07 DIAGNOSIS — M25561 Pain in right knee: Secondary | ICD-10-CM | POA: Diagnosis not present

## 2019-01-07 DIAGNOSIS — R531 Weakness: Secondary | ICD-10-CM | POA: Diagnosis not present

## 2019-01-08 DIAGNOSIS — S83241D Other tear of medial meniscus, current injury, right knee, subsequent encounter: Secondary | ICD-10-CM | POA: Diagnosis not present

## 2019-01-08 DIAGNOSIS — S83281D Other tear of lateral meniscus, current injury, right knee, subsequent encounter: Secondary | ICD-10-CM | POA: Diagnosis not present

## 2019-01-09 DIAGNOSIS — S83511D Sprain of anterior cruciate ligament of right knee, subsequent encounter: Secondary | ICD-10-CM | POA: Diagnosis not present

## 2019-01-09 DIAGNOSIS — R262 Difficulty in walking, not elsewhere classified: Secondary | ICD-10-CM | POA: Diagnosis not present

## 2019-01-09 DIAGNOSIS — R531 Weakness: Secondary | ICD-10-CM | POA: Diagnosis not present

## 2019-01-09 DIAGNOSIS — M25561 Pain in right knee: Secondary | ICD-10-CM | POA: Diagnosis not present

## 2019-01-16 DIAGNOSIS — M25561 Pain in right knee: Secondary | ICD-10-CM | POA: Diagnosis not present

## 2019-01-16 DIAGNOSIS — S83511D Sprain of anterior cruciate ligament of right knee, subsequent encounter: Secondary | ICD-10-CM | POA: Diagnosis not present

## 2019-01-16 DIAGNOSIS — R531 Weakness: Secondary | ICD-10-CM | POA: Diagnosis not present

## 2019-01-16 DIAGNOSIS — R262 Difficulty in walking, not elsewhere classified: Secondary | ICD-10-CM | POA: Diagnosis not present

## 2019-01-23 DIAGNOSIS — R262 Difficulty in walking, not elsewhere classified: Secondary | ICD-10-CM | POA: Diagnosis not present

## 2019-01-23 DIAGNOSIS — M25561 Pain in right knee: Secondary | ICD-10-CM | POA: Diagnosis not present

## 2019-01-23 DIAGNOSIS — S83511D Sprain of anterior cruciate ligament of right knee, subsequent encounter: Secondary | ICD-10-CM | POA: Diagnosis not present

## 2019-01-23 DIAGNOSIS — R531 Weakness: Secondary | ICD-10-CM | POA: Diagnosis not present

## 2019-01-31 DIAGNOSIS — M25561 Pain in right knee: Secondary | ICD-10-CM | POA: Diagnosis not present

## 2019-01-31 DIAGNOSIS — R531 Weakness: Secondary | ICD-10-CM | POA: Diagnosis not present

## 2019-01-31 DIAGNOSIS — S83511D Sprain of anterior cruciate ligament of right knee, subsequent encounter: Secondary | ICD-10-CM | POA: Diagnosis not present

## 2019-01-31 DIAGNOSIS — R262 Difficulty in walking, not elsewhere classified: Secondary | ICD-10-CM | POA: Diagnosis not present

## 2019-02-02 DIAGNOSIS — J209 Acute bronchitis, unspecified: Secondary | ICD-10-CM | POA: Diagnosis not present

## 2019-02-08 DIAGNOSIS — J069 Acute upper respiratory infection, unspecified: Secondary | ICD-10-CM | POA: Diagnosis not present

## 2019-02-11 DIAGNOSIS — S83241D Other tear of medial meniscus, current injury, right knee, subsequent encounter: Secondary | ICD-10-CM | POA: Diagnosis not present

## 2019-02-11 DIAGNOSIS — S83281D Other tear of lateral meniscus, current injury, right knee, subsequent encounter: Secondary | ICD-10-CM | POA: Diagnosis not present

## 2019-02-13 DIAGNOSIS — R350 Frequency of micturition: Secondary | ICD-10-CM | POA: Diagnosis not present

## 2019-02-13 DIAGNOSIS — R202 Paresthesia of skin: Secondary | ICD-10-CM | POA: Diagnosis not present

## 2019-02-13 DIAGNOSIS — R631 Polydipsia: Secondary | ICD-10-CM | POA: Diagnosis not present

## 2019-02-14 DIAGNOSIS — S83511D Sprain of anterior cruciate ligament of right knee, subsequent encounter: Secondary | ICD-10-CM | POA: Diagnosis not present

## 2019-02-14 DIAGNOSIS — M25561 Pain in right knee: Secondary | ICD-10-CM | POA: Diagnosis not present

## 2019-02-14 DIAGNOSIS — R531 Weakness: Secondary | ICD-10-CM | POA: Diagnosis not present

## 2019-02-14 DIAGNOSIS — R262 Difficulty in walking, not elsewhere classified: Secondary | ICD-10-CM | POA: Diagnosis not present

## 2019-02-25 ENCOUNTER — Other Ambulatory Visit: Payer: Self-pay | Admitting: Cardiology

## 2019-02-27 DIAGNOSIS — S83511D Sprain of anterior cruciate ligament of right knee, subsequent encounter: Secondary | ICD-10-CM | POA: Diagnosis not present

## 2019-02-27 DIAGNOSIS — R262 Difficulty in walking, not elsewhere classified: Secondary | ICD-10-CM | POA: Diagnosis not present

## 2019-02-27 DIAGNOSIS — M25561 Pain in right knee: Secondary | ICD-10-CM | POA: Diagnosis not present

## 2019-02-27 DIAGNOSIS — R531 Weakness: Secondary | ICD-10-CM | POA: Diagnosis not present

## 2019-03-05 ENCOUNTER — Telehealth: Payer: Self-pay

## 2019-03-05 DIAGNOSIS — J3089 Other allergic rhinitis: Secondary | ICD-10-CM | POA: Diagnosis not present

## 2019-03-05 MED ORDER — PANTOPRAZOLE SODIUM 40 MG PO TBEC: DELAYED_RELEASE_TABLET | ORAL | 3 refills | Status: DC

## 2019-03-05 NOTE — Telephone Encounter (Signed)
The pt was advised that I can refill her pantoprazole and she will pick up today.  She will call back if her symptoms do not improve.

## 2019-03-07 DIAGNOSIS — J3089 Other allergic rhinitis: Secondary | ICD-10-CM | POA: Diagnosis not present

## 2019-03-11 DIAGNOSIS — J3089 Other allergic rhinitis: Secondary | ICD-10-CM | POA: Diagnosis not present

## 2019-03-14 DIAGNOSIS — J3089 Other allergic rhinitis: Secondary | ICD-10-CM | POA: Diagnosis not present

## 2019-03-15 DIAGNOSIS — S83511D Sprain of anterior cruciate ligament of right knee, subsequent encounter: Secondary | ICD-10-CM | POA: Diagnosis not present

## 2019-03-15 DIAGNOSIS — M25561 Pain in right knee: Secondary | ICD-10-CM | POA: Diagnosis not present

## 2019-03-15 DIAGNOSIS — R262 Difficulty in walking, not elsewhere classified: Secondary | ICD-10-CM | POA: Diagnosis not present

## 2019-03-15 DIAGNOSIS — R531 Weakness: Secondary | ICD-10-CM | POA: Diagnosis not present

## 2019-03-22 DIAGNOSIS — R262 Difficulty in walking, not elsewhere classified: Secondary | ICD-10-CM | POA: Diagnosis not present

## 2019-03-22 DIAGNOSIS — S83511D Sprain of anterior cruciate ligament of right knee, subsequent encounter: Secondary | ICD-10-CM | POA: Diagnosis not present

## 2019-03-22 DIAGNOSIS — M25561 Pain in right knee: Secondary | ICD-10-CM | POA: Diagnosis not present

## 2019-03-22 DIAGNOSIS — R531 Weakness: Secondary | ICD-10-CM | POA: Diagnosis not present

## 2019-03-31 DIAGNOSIS — S83511D Sprain of anterior cruciate ligament of right knee, subsequent encounter: Secondary | ICD-10-CM | POA: Diagnosis not present

## 2019-03-31 DIAGNOSIS — R262 Difficulty in walking, not elsewhere classified: Secondary | ICD-10-CM | POA: Diagnosis not present

## 2019-03-31 DIAGNOSIS — M25561 Pain in right knee: Secondary | ICD-10-CM | POA: Diagnosis not present

## 2019-03-31 DIAGNOSIS — R531 Weakness: Secondary | ICD-10-CM | POA: Diagnosis not present

## 2019-04-08 DIAGNOSIS — J3089 Other allergic rhinitis: Secondary | ICD-10-CM | POA: Diagnosis not present

## 2019-04-11 DIAGNOSIS — J3089 Other allergic rhinitis: Secondary | ICD-10-CM | POA: Diagnosis not present

## 2019-04-15 DIAGNOSIS — J3089 Other allergic rhinitis: Secondary | ICD-10-CM | POA: Diagnosis not present

## 2019-04-18 ENCOUNTER — Encounter: Payer: Self-pay | Admitting: Internal Medicine

## 2019-04-18 ENCOUNTER — Encounter: Payer: Medicare Other | Admitting: Gynecology

## 2019-04-18 NOTE — Telephone Encounter (Signed)
error 

## 2019-04-19 DIAGNOSIS — J3089 Other allergic rhinitis: Secondary | ICD-10-CM | POA: Diagnosis not present

## 2019-05-13 DIAGNOSIS — S83511D Sprain of anterior cruciate ligament of right knee, subsequent encounter: Secondary | ICD-10-CM | POA: Diagnosis not present

## 2019-05-13 DIAGNOSIS — R262 Difficulty in walking, not elsewhere classified: Secondary | ICD-10-CM | POA: Diagnosis not present

## 2019-05-13 DIAGNOSIS — R531 Weakness: Secondary | ICD-10-CM | POA: Diagnosis not present

## 2019-05-13 DIAGNOSIS — M25561 Pain in right knee: Secondary | ICD-10-CM | POA: Diagnosis not present

## 2019-05-16 DIAGNOSIS — S83241D Other tear of medial meniscus, current injury, right knee, subsequent encounter: Secondary | ICD-10-CM | POA: Diagnosis not present

## 2019-05-16 DIAGNOSIS — S83281D Other tear of lateral meniscus, current injury, right knee, subsequent encounter: Secondary | ICD-10-CM | POA: Diagnosis not present

## 2019-05-22 DIAGNOSIS — J3089 Other allergic rhinitis: Secondary | ICD-10-CM | POA: Diagnosis not present

## 2019-06-03 DIAGNOSIS — L820 Inflamed seborrheic keratosis: Secondary | ICD-10-CM | POA: Diagnosis not present

## 2019-06-03 DIAGNOSIS — L821 Other seborrheic keratosis: Secondary | ICD-10-CM | POA: Diagnosis not present

## 2019-06-04 ENCOUNTER — Other Ambulatory Visit: Payer: Self-pay | Admitting: Cardiology

## 2019-06-04 DIAGNOSIS — M25561 Pain in right knee: Secondary | ICD-10-CM | POA: Diagnosis not present

## 2019-06-04 DIAGNOSIS — R262 Difficulty in walking, not elsewhere classified: Secondary | ICD-10-CM | POA: Diagnosis not present

## 2019-06-04 DIAGNOSIS — S83511D Sprain of anterior cruciate ligament of right knee, subsequent encounter: Secondary | ICD-10-CM | POA: Diagnosis not present

## 2019-06-04 DIAGNOSIS — R531 Weakness: Secondary | ICD-10-CM | POA: Diagnosis not present

## 2019-06-16 ENCOUNTER — Encounter

## 2019-06-18 ENCOUNTER — Encounter: Payer: Medicare Other | Admitting: Gynecology

## 2019-06-19 DIAGNOSIS — J3089 Other allergic rhinitis: Secondary | ICD-10-CM | POA: Diagnosis not present

## 2019-07-01 DIAGNOSIS — S83511D Sprain of anterior cruciate ligament of right knee, subsequent encounter: Secondary | ICD-10-CM | POA: Diagnosis not present

## 2019-07-01 DIAGNOSIS — R262 Difficulty in walking, not elsewhere classified: Secondary | ICD-10-CM | POA: Diagnosis not present

## 2019-07-01 DIAGNOSIS — M25561 Pain in right knee: Secondary | ICD-10-CM | POA: Diagnosis not present

## 2019-07-01 DIAGNOSIS — R531 Weakness: Secondary | ICD-10-CM | POA: Diagnosis not present

## 2019-07-04 ENCOUNTER — Encounter: Payer: Medicare Other | Admitting: Gynecology

## 2019-07-18 DIAGNOSIS — S83511A Sprain of anterior cruciate ligament of right knee, initial encounter: Secondary | ICD-10-CM | POA: Diagnosis not present

## 2019-07-22 DIAGNOSIS — J3089 Other allergic rhinitis: Secondary | ICD-10-CM | POA: Diagnosis not present

## 2019-08-02 DIAGNOSIS — M25561 Pain in right knee: Secondary | ICD-10-CM | POA: Diagnosis not present

## 2019-08-02 DIAGNOSIS — S83511D Sprain of anterior cruciate ligament of right knee, subsequent encounter: Secondary | ICD-10-CM | POA: Diagnosis not present

## 2019-08-02 DIAGNOSIS — R531 Weakness: Secondary | ICD-10-CM | POA: Diagnosis not present

## 2019-08-02 DIAGNOSIS — R262 Difficulty in walking, not elsewhere classified: Secondary | ICD-10-CM | POA: Diagnosis not present

## 2019-08-05 ENCOUNTER — Telehealth: Payer: Self-pay | Admitting: Internal Medicine

## 2019-08-05 NOTE — Telephone Encounter (Signed)
Pt states she has not had issues with IBS for a couple of years. She thinks it may be stress related to covid. For 2 weeks she had had bloating, abd very distended when she eats anything and this is uncomfortable. She has also had issues with constipation. She took dulcolax and drank smooth move tea but she is still having issues. Suggested pt take florastor daily, IB Gard, and miralax for constipation. Pt will call back with an update in several days. Dr. Henrene Pastor aware.

## 2019-08-05 NOTE — Telephone Encounter (Signed)
Noted  

## 2019-08-08 ENCOUNTER — Ambulatory Visit (INDEPENDENT_AMBULATORY_CARE_PROVIDER_SITE_OTHER): Payer: Medicare Other | Admitting: Cardiology

## 2019-08-08 ENCOUNTER — Other Ambulatory Visit: Payer: Self-pay

## 2019-08-08 ENCOUNTER — Encounter: Payer: Self-pay | Admitting: Cardiology

## 2019-08-08 VITALS — BP 120/76 | HR 55 | Ht 63.5 in | Wt 115.2 lb

## 2019-08-08 DIAGNOSIS — I10 Essential (primary) hypertension: Secondary | ICD-10-CM | POA: Diagnosis not present

## 2019-08-08 DIAGNOSIS — I2510 Atherosclerotic heart disease of native coronary artery without angina pectoris: Secondary | ICD-10-CM

## 2019-08-08 DIAGNOSIS — R0600 Dyspnea, unspecified: Secondary | ICD-10-CM | POA: Diagnosis not present

## 2019-08-08 DIAGNOSIS — E782 Mixed hyperlipidemia: Secondary | ICD-10-CM

## 2019-08-08 DIAGNOSIS — I25119 Atherosclerotic heart disease of native coronary artery with unspecified angina pectoris: Secondary | ICD-10-CM | POA: Diagnosis not present

## 2019-08-08 MED ORDER — LOSARTAN POTASSIUM 25 MG PO TABS: 25.0000 mg | ORAL_TABLET | Freq: Every day | ORAL | 2 refills | Status: DC

## 2019-08-08 MED ORDER — PRAVASTATIN SODIUM 20 MG PO TABS: 20.0000 mg | ORAL_TABLET | Freq: Every day | ORAL | 2 refills | Status: DC

## 2019-08-08 NOTE — Progress Notes (Signed)
08/08/2019 Madison Kramer   1946-06-30  [id number]  Primary Physician [name], MD Primary Cardiologist: Dr. Meda Coffee (formerly Dr. Aundra Dubin)  Reason for Visit/CC: Dyspnea   HPI:  Madison Kramer is a 73 y.o. female who is being seen today for the evaluation of dyspnea. She is a former patient of Dr. Claris Gladden. Her PMH is notable for atypical chest pain and palpitations. She has undergone ischemic w/u. She had a Cardiolite NST in 3/11 that was normal and had a repeat study 5/17 that was also normal with no ischemia of infarction. 2D echo in 2014 was normal with normal LVEF. She also has treated HTN and HLD. She is on losartan and Lipitor. She is very active for her age. She still works. She is a Forensic psychologist. Also exercises daily (swimming, treadmill, stair climber, elliptical and light weight training). She also notes chronic allergies (dust mites and mold). 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  06/30/2017 - this is 3 months follow-up, the patient states that she has been feeling great she continues to swing Treadmill shortness of breath or chest pain unless she is doing very strenuous activity. She denies any palpitations no syncope no lower extremity edema orthopnea. She has no side effects with Lipitor. She has been seeing nutritionist for IBS and she recommended to decrease dose of atorvastatin if possible. She has been following strict diet lactose-free and gluten-free with significant improvement with her IBS.  11/01/2018 -this is 1 year follow-up, she has had an episode of palpitations in the middle of the night when she called EMS but EKG was normal and at the time when EMS arrived she had normal heart rate and did not go to ER.  No recurrence in the last 5 months.  She continues to be active and has noticed shortness of breath, she stopped taking atorvastatin as she was experiencing muscle pain.   08/08/2019 -this is 1 year follow-up,  the patient has been doing well, remains active, she is concerned about her weight, when she states she has gained 2 pounds, she denies any chest pain shortness of breath she exercises daily, she continues to take her medications and has no side effects.   Current Meds  Medication Sig  . Calcium Carbonate-Vitamin D (CALCIUM + D PO) Take 1,200 mg by mouth daily.   . Cholecalciferol (VITAMIN D PO) Take 2,000 Units by mouth every other day.   . fluticasone (FLONASE) 50 MCG/ACT nasal spray Place 1 spray into both nostrils as needed.   . Glucosamine-Chondroitin-MSM 500-200-150 MG TABS Take 1 tablet by mouth 2 (two) times daily.  Marland Kitchen losartan (COZAAR) 25 MG tablet TAKE 1 TABLET ONCE DAILY.  . Magnesium 250 MG TABS Take 250 mg by mouth daily.  . Omega-3 Fatty Acids (FISH OIL PO) Take 2 capsules by mouth at bedtime.  . pantoprazole (PROTONIX) 40 MG tablet Take 1 tablet by mouth every morning.  . pravastatin (PRAVACHOL) 20 MG tablet TAKE ONE TABLET AT BEDTIME.  . Probiotic Product (PROBIOTIC DAILY PO) Take 1 tablet by mouth daily.  . TURMERIC PO Take 1 tablet by mouth daily.   Allergies  Allergen Reactions  . Atorvastatin Other (See Comments)    Pt reports causes lower extremity muscle cramps  . Codeine Nausea Only  . Erythromycin Other (See Comments)    Stomach cramping  . Sulfa Antibiotics     As a child  . Sulfamethoxazole Other (See Comments)  Unsdure of reaction. Had as a child.  . Sulfonamide Derivatives   . Tetracyclines & Related     unknown  . Tramadol Nausea Only and Other (See Comments)    Dizziness and mind racing   Past Medical History:  Diagnosis Date  . Allergic rhinitis   . Anxiety   . Arthritis   . Asthma   . Atypical chest pain    Possibly from DES  . Back pain   . Cataract    Hx; of  . Diverticulosis    Left  . Elevated cholesterol   . GERD (gastroesophageal reflux disease)   . Hemoptysis 2007   From reflux pharyngitis  . History of nuclear stress test     Myoview 5/17: EF 70%, no scar or ischemia, excellent exercise capacity, low risk  . Hypertension   . Insomnia   . Lactose intolerance   . Lichen sclerosus et atrophicus of the vulva 2008   Biopsy-proven  . Nocturia   . Osteoporosis 10/2015   T score -2.5 left femoral neck  . Pneumonia   . Raynaud's syndrome    "possible"   Family History  Problem Relation Age of Onset  . Heart disease Mother        died at 65; ? MI  . Hypertension Mother   . Diabetes Mother   . Multiple myeloma Father   . Heart disease Paternal Grandfather        age 24 - died of MI  . Breast cancer Maternal Aunt        Age 55's  . Heart disease Maternal Uncle    Past Surgical History:  Procedure Laterality Date  . BREAST SURGERY  2001   REDUCTION MAMMAPLASTY  . COLONOSCOPY    . FOOT SURGERY  2009  . HYSTEROSCOPY  2003   HYSTEROSCOPIC RESECTION OF MYOMA  . INGUINAL HERNIA REPAIR  1971   BILATERAL  . LUMBAR LAMINECTOMY/DECOMPRESSION MICRODISCECTOMY Right 08/30/2013   Procedure: Right Lumbar four-five Extraforaminal Diskectomy;  Surgeon: Hosie Spangle, MD;  Location: [place] [facility];  Service: Neurosurgery;  Laterality: Right;  . MANDIBLE SURGERY    . MYOMECTOMY    . TONSILLECTOMY    . TUBAL LIGATION  1987   Social History   Socioeconomic History  . Marital status: Divorced    Spouse name: Not on file  . Number of children: 2  . Years of education: Not on file  . Highest education level: Not on file  Occupational History  . Occupation: Architectural technologist: [employer]  . Financial resource strain: Not on file  . Food insecurity    Worry: Not on file    Inability: Not on file  . Transportation needs    Medical: Not on file    Non-medical: Not on file  Tobacco Use  . Smoking status: Former Smoker    Years: 20.00    Quit date: 12/12/1988    Years since quitting: 30.6  . Smokeless tobacco: Never Used  Substance and Sexual Activity  . Alcohol use: Yes    Alcohol/week: 7.0  standard drinks    Types: 7 Standard drinks or equivalent per week    Comment: 7 glasses  . Drug use: No  . Sexual activity: Never    Birth control/protection: Post-menopausal    Comment: 1st intercourse 73 yo-Fewer than 5 partners  Lifestyle  . Physical activity    Days per week: Not on file    Minutes per  session: Not on file  . Stress: Not on file  Relationships  . Social Herbalist on phone: Not on file    Gets together: Not on file    Attends religious service: Not on file    Active member of club or organization: Not on file    Attends meetings of clubs or organizations: Not on file    Relationship status: Not on file  . Intimate partner violence    Fear of current or ex partner: Not on file    Emotionally abused: Not on file    Physically abused: Not on file    Forced sexual activity: Not on file  Other Topics Concern  . Not on file  Social History Narrative  . Not on file     Review of Systems: General: negative for chills, fever, night sweats or weight changes.  Cardiovascular: negative for chest pain, dyspnea on exertion, edema, orthopnea, palpitations, paroxysmal nocturnal dyspnea or shortness of breath Dermatological: negative for rash Respiratory: negative for cough or wheezing Urologic: negative for hematuria Abdominal: negative for nausea, vomiting, diarrhea, bright red blood per rectum, melena, or hematemesis Neurologic: negative for visual changes, syncope, or dizziness All other systems reviewed and are otherwise negative except as noted above.   Physical Exam:  Blood pressure 120/76, pulse (!) 55, height 5' 3.5" (1.613 m), weight 115 lb 3.2 oz (52.3 kg), SpO2 99 %.  General appearance: alert, cooperative, no distress and slighlty anxious Neck: no carotid bruit and no JVD Lungs: clear to auscultation bilaterally Heart: regular rate and rhythm, S1, S2 normal, no murmur, click, rub or gallop Extremities: extremities normal, atraumatic, no  cyanosis or edema Pulses: 2+ and symmetric Skin: Skin color, texture, turgor normal. No rashes or lesions Neurologic: Grossly normal  EKG sinus rhythm, normal EKG-- personally reviewed     ASSESSMENT AND PLAN:   1.  CAD -coronary CTA on November 30, 2018 showed coronary calcium of 255 that was 52 percentile for age and sex matched control, she had mild plaque in the proximal mid LAD and mid RCA, FFR was normal.  She has not had any symptoms since then, will continue primary prevention with pravastatin.  2. Hypertension - well controlled, we will continue losartan to 25 mg daily.  3. Hyperlipidemia-she is compliant with pravastatin and fish oil.  Her most recent lipids in January 2020 showed improvement of LDL to 67, HDL 72 triglycerides 98.  We will continue the same regimen.  Her LFTs were normal.  Follow-up in 6 months.    Ena Dawley , MD [facility] 08/08/2019 4:29 PM

## 2019-08-08 NOTE — Patient Instructions (Addendum)
Medication Instructions:   Your physician recommends that you continue on your current medications as directed. Please refer to the Current Medication list given to you today.  If you need a refill on your cardiac medications before your next appointment, please call your pharmacy.       Follow-Up: At [facility], you and your health needs are our priority.  As part of our continuing mission to provide you with exceptional heart care, we have created designated Provider Care Teams.  These Care Teams include your primary Cardiologist (physician) and Advanced Practice Providers (APPs -  Physician Assistants and Nurse Practitioners) who all work together to provide you with the care you need, when you need it.  You will need a follow up appointment in 6 months.  Please call our office 2 months in advance to schedule this appointment.  You may see Ena Dawley, MD or one of the following Advanced Practice Providers on your designated Care Team:   [place], PA-C Melina Copa, PA-C . Ermalinda Barrios, PA-C  Any Other Special Instructions Will Be Listed Below (If Applicable). CALL THE OFFICE AT [phone] IN 4 MONTHS TO SCHEDULE YOUR 6 MONTH FOLLOW-UP APPOINTMENT WITH DR Meda Coffee OR YOU CAN MYCHART DR NELSON AND MYSELF AT THE 4 MONTH MARK TO ASK FOR ASSISTANCE WITH SCHEDULING YOUR 6 MONTH FOLLOW-UP APPOINTMENT WITH HER.

## 2019-08-09 DIAGNOSIS — Z23 Encounter for immunization: Secondary | ICD-10-CM | POA: Diagnosis not present

## 2019-08-13 ENCOUNTER — Telehealth: Payer: Self-pay | Admitting: Internal Medicine

## 2019-08-13 NOTE — Telephone Encounter (Signed)
Pt called back and states she does feel some better but wanted to know when she can expect things to turn around. She was worried about taking the miralax everyday. Explained we have pts that take miralax indefinitely and that she has only been on the ib gard and florastor for a week. Encouraged pt to schedule an OV. Pt scheduled to see Dr. Henrene Pastor 08/28/19 at 2pm, she is aware of appt and states she may cancel the appt if she feels better.

## 2019-08-23 DIAGNOSIS — J3089 Other allergic rhinitis: Secondary | ICD-10-CM | POA: Diagnosis not present

## 2019-08-27 DIAGNOSIS — M25561 Pain in right knee: Secondary | ICD-10-CM | POA: Diagnosis not present

## 2019-08-27 DIAGNOSIS — S83511D Sprain of anterior cruciate ligament of right knee, subsequent encounter: Secondary | ICD-10-CM | POA: Diagnosis not present

## 2019-08-27 DIAGNOSIS — R262 Difficulty in walking, not elsewhere classified: Secondary | ICD-10-CM | POA: Diagnosis not present

## 2019-08-27 DIAGNOSIS — R531 Weakness: Secondary | ICD-10-CM | POA: Diagnosis not present

## 2019-08-28 ENCOUNTER — Encounter: Payer: Self-pay | Admitting: Internal Medicine

## 2019-08-28 ENCOUNTER — Ambulatory Visit (INDEPENDENT_AMBULATORY_CARE_PROVIDER_SITE_OTHER): Payer: Medicare Other | Admitting: Internal Medicine

## 2019-08-28 ENCOUNTER — Telehealth: Payer: Self-pay | Admitting: Internal Medicine

## 2019-08-28 VITALS — BP 110/72 | HR 68 | Temp 97.6°F | Ht 64.0 in | Wt 114.1 lb

## 2019-08-28 DIAGNOSIS — K581 Irritable bowel syndrome with constipation: Secondary | ICD-10-CM | POA: Diagnosis not present

## 2019-08-28 DIAGNOSIS — R140 Abdominal distension (gaseous): Secondary | ICD-10-CM

## 2019-08-28 DIAGNOSIS — I2510 Atherosclerotic heart disease of native coronary artery without angina pectoris: Secondary | ICD-10-CM | POA: Diagnosis not present

## 2019-08-28 MED ORDER — RIFAXIMIN 550 MG PO TABS: 550.0000 mg | ORAL_TABLET | Freq: Three times a day (TID) | ORAL | 0 refills | Status: DC

## 2019-08-28 NOTE — Telephone Encounter (Signed)
Pt states that xifaxan needs PA and she will be going out of town on Monday. She would like to speak with you.

## 2019-08-28 NOTE — Patient Instructions (Signed)
We have sent the following medications to your pharmacy for you to pick up at your convenience: Xifaxan  As discussed with Dr. Henrene Pastor, continue exercising, finish your IB Donald Prose, and probiotic.  Take Metamucil daily.  If this causes too much bloating, switch to Citrucel.

## 2019-08-28 NOTE — Progress Notes (Signed)
HISTORY OF PRESENT ILLNESS:  Madison Kramer is a 73 y.o. female with past medical history as listed below who has been followed in this office previously for GERD and irritable bowel syndrome.  She has not been seen since May 18, 2017 by the GI physician assistant.  See that dictation for details.  She presents today with chief complaints of alternating bowel habits and bloating.  She states this began after having had antibiotics for pneumonia in February as well as the onset of COVID.  She tells me that she has been on a strict exercise regimen and strict diet which seem to control her abdominal complaints.  However with change in activity level, stress, and less disciplined eating habits she is gained 2 pounds.  She states her abdomen feels protuberant.  Worse after meals.  Bowel habits have been on the constipated side.  Recently contacted the office and was told to take probiotic Florastor, IBgard, and MiraLAX.  She initiated these therapies and states that they seem to help though incompletely.  She has been using Metamucil every other day.  No problems with pain but still uncomfortable with bloating.  She has a myriad of questions regarding several issues including the role of a colon cleanse.  Her last complete colonoscopy was performed July 05, 2017.  Examination revealed diverticulosis but was otherwise normal.  Laboratories from January 2020 were unremarkable including normal liver tests.  She does tell me that she has resumed regular exercise activities except for the gymnasium.  She continues to enjoy alcohol though modestly.  Currently she describes her bowel habits as somewhat alternating.  REVIEW OF SYSTEMS:  All non-GI ROS negative unless otherwise stated in the HPI except for arthritis, sinus and allergy, occasional sleeping problems, occasional back pain, urinary frequency  Past Medical History:  Diagnosis Date  . Allergic rhinitis   . Anxiety   . Arthritis   . Asthma   . Atypical  chest pain    Possibly from DES  . Back pain   . Cataract    Hx; of  . Diverticulosis    Left  . Elevated cholesterol   . GERD (gastroesophageal reflux disease)   . Hemoptysis 2007   From reflux pharyngitis  . History of nuclear stress test    Myoview 5/17: EF 70%, no scar or ischemia, excellent exercise capacity, low risk  . Hypertension   . Insomnia   . Lactose intolerance   . Lichen sclerosus et atrophicus of the vulva 2008   Biopsy-proven  . Nocturia   . Osteoporosis 10/2015   T score -2.5 left femoral neck  . Pneumonia   . Raynaud's syndrome    "possible"    Past Surgical History:  Procedure Laterality Date  . BREAST SURGERY  2001   REDUCTION MAMMAPLASTY  . COLONOSCOPY    . FOOT SURGERY  2009  . HYSTEROSCOPY  2003   HYSTEROSCOPIC RESECTION OF MYOMA  . INGUINAL HERNIA REPAIR  1971   BILATERAL  . LUMBAR LAMINECTOMY/DECOMPRESSION MICRODISCECTOMY Right 08/30/2013   Procedure: Right Lumbar four-five Extraforaminal Diskectomy;  Surgeon: Hosie Spangle, MD;  Location: [place] [facility];  Service: Neurosurgery;  Laterality: Right;  . MANDIBLE SURGERY    . MYOMECTOMY    . TONSILLECTOMY    . TUBAL LIGATION  1987    Social History TIFFANYANN DEROO  reports that she quit smoking about 30 years ago. She quit after 20.00 years of use. She has never used smokeless tobacco. She reports current  alcohol use of about 7.0 standard drinks of alcohol per week. She reports that she does not use drugs.  family history includes Breast cancer in her maternal aunt; Diabetes in her mother; Heart disease in her maternal uncle, mother, and paternal grandfather; Hypertension in her mother; Multiple myeloma in her father.  Allergies  Allergen Reactions  . Atorvastatin Other (See Comments)    Pt reports causes lower extremity muscle cramps  . Codeine Nausea Only  . Erythromycin Other (See Comments)    Stomach cramping  . Sulfa Antibiotics     As a child  . Sulfamethoxazole Other (See  Comments)    Unsdure of reaction. Had as a child.  . Sulfonamide Derivatives   . Tetracyclines & Related     unknown  . Tramadol Nausea Only and Other (See Comments)    Dizziness and mind racing       PHYSICAL EXAMINATION: Vital signs: BP 110/72 (BP Location: Left Arm, Patient Position: Sitting, Cuff Size: Normal)   Pulse 68   Temp 97.6 F (36.4 C) (Oral)   Ht '5\' 4"'  (1.626 m)   Wt 114 lb 2 oz (51.8 kg)   BMI 19.59 kg/m   Constitutional: generally well-appearing, no acute distress Psychiatric: alert and oriented x3, cooperative Eyes: extraocular movements intact, anicteric, conjunctiva pink Mouth: oral pharynx moist, no lesions Neck: supple no lymphadenopathy Cardiovascular: heart regular rate and rhythm, no murmur Lungs: clear to auscultation bilaterally Abdomen: soft, nontender, nondistended, no obvious ascites, no peritoneal signs, normal bowel sounds, no organomegaly Rectal: Omitted Extremities: no clubbing, cyanosis, or lower extremity edema bilaterally Skin: no lesions on visible extremities Neuro: No focal deficits.  Cranial nerves intact  ASSESSMENT:  1.  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  Bloating due to the same 3.  Colonoscopy 2018 with diverticulosis 4.  GERD.  Little in the way of complaints at this time   PLAN:  1.  Try daily fiber supplementation in the form of Metamucil 2.  If Metamucil results in excessive bloating then substitute Citrucel 3.  Continue exercise regimen 4.  Complete additional course of IBgard 5.  Complete current probiotic 6.  Prescribe Xifaxan 550 mg 3 times daily for 2 weeks 7.  GI follow-up in 2 months 25 minutes spent face-to-face with the patient.  Greater than 50% of the time used for counseling regarding her irritable bowel syndrome and trouble some bloating issues.

## 2019-08-29 NOTE — Telephone Encounter (Signed)
Spoke with patient and told her that I had just gotten samples of Xifaxan.  I told her I would leave them up front with her name on them.  Patient agreed.

## 2019-09-02 ENCOUNTER — Telehealth: Payer: Self-pay | Admitting: Internal Medicine

## 2019-09-02 NOTE — Telephone Encounter (Signed)
Usually rifaximin is a 14-day course but if she is having diarrhea would recommend that she hold rifaximin for 24 to 48 hours to see if diarrhea resolves. In the interim she can continue with fiber supplementation either Metamucil or Citrucel as well as IBgard I will alert Dr. Henrene Pastor to this message and we can see how she does in the next 24 to 48 hours regarding the diarrhea

## 2019-09-02 NOTE — Telephone Encounter (Signed)
Madison Kramer pt calling. States she was given xifaxan to take and it is the only thing she has taken new in the past 4 days. States she is having to get up during the night 3-4 times with diarrhea and also having diarrhea a couple of times during the day. She is usually constipated. Note recommendations attached. Please advise as DOD.  ASSESSMENT:  1.  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  Bloating due to the same 3.  Colonoscopy 2018 with diverticulosis 4.  GERD.  Little in the way of complaints at this time   PLAN:  1.  Try daily fiber supplementation in the form of Metamucil 2.  If Metamucil results in excessive bloating then substitute Citrucel 3.  Continue exercise regimen 4.  Complete additional course of IBgard 5.  Complete current probiotic 6.  Prescribe Xifaxan 550 mg 3 times daily for 2 weeks 7.  GI follow-up in 2 months 25 minutes spent face-to-face with the patient.  Greater than 50% of the time used for counseling regarding her irritable bowel syndrome and trouble some bloating issues.

## 2019-09-02 NOTE — Telephone Encounter (Signed)
Spoke with pt and she is aware and knows to call back with an update in 48 hours.

## 2019-09-05 ENCOUNTER — Telehealth: Payer: Self-pay | Admitting: Internal Medicine

## 2019-09-05 NOTE — Telephone Encounter (Signed)
Pt called a few days ago and stated that when she started taking the xifaxan she had diarrhea. Dr. Hilarie Fredrickson had her stop the xifaxan for 24-48 hours. See note below. She states that the diarrhea/loose stools have gotten much better since stopping the xifaxan. Please advise.  Pyrtle, Lajuan Lines, MD to Me . Irene Shipper, MD     09/02/19 4:27 PM Note   Usually rifaximin is a 14-day course but if she is having diarrhea would recommend that she hold rifaximin for 24 to 48 hours to see if diarrhea resolves. In the interim she can continue with fiber supplementation either Metamucil or Citrucel as well as IBgard I will alert Dr. Henrene Pastor to this message and we can see how she does in the next 24 to 48 hours regarding the diarrhea

## 2019-09-05 NOTE — Telephone Encounter (Signed)
Spoke with pt and she is aware, she is not having symptoms.

## 2019-09-05 NOTE — Telephone Encounter (Signed)
If she is asymptomatic then nothing further needs to be done.  If she is having symptoms as previous for which she saw me in the office, then resume Xifaxan.  Thanks

## 2019-09-16 ENCOUNTER — Encounter: Payer: Self-pay | Admitting: Gynecology

## 2019-09-16 DIAGNOSIS — Z1231 Encounter for screening mammogram for malignant neoplasm of breast: Secondary | ICD-10-CM | POA: Diagnosis not present

## 2019-09-17 DIAGNOSIS — D485 Neoplasm of uncertain behavior of skin: Secondary | ICD-10-CM | POA: Diagnosis not present

## 2019-09-17 DIAGNOSIS — L814 Other melanin hyperpigmentation: Secondary | ICD-10-CM | POA: Diagnosis not present

## 2019-09-17 DIAGNOSIS — L821 Other seborrheic keratosis: Secondary | ICD-10-CM | POA: Diagnosis not present

## 2019-09-17 DIAGNOSIS — D1801 Hemangioma of skin and subcutaneous tissue: Secondary | ICD-10-CM | POA: Diagnosis not present

## 2019-09-17 DIAGNOSIS — D0461 Carcinoma in situ of skin of right upper limb, including shoulder: Secondary | ICD-10-CM | POA: Diagnosis not present

## 2019-09-18 ENCOUNTER — Encounter: Payer: Self-pay | Admitting: Gynecology

## 2019-09-19 DIAGNOSIS — S83511D Sprain of anterior cruciate ligament of right knee, subsequent encounter: Secondary | ICD-10-CM | POA: Diagnosis not present

## 2019-09-24 DIAGNOSIS — M25561 Pain in right knee: Secondary | ICD-10-CM | POA: Diagnosis not present

## 2019-09-24 DIAGNOSIS — S83511D Sprain of anterior cruciate ligament of right knee, subsequent encounter: Secondary | ICD-10-CM | POA: Diagnosis not present

## 2019-09-24 DIAGNOSIS — R262 Difficulty in walking, not elsewhere classified: Secondary | ICD-10-CM | POA: Diagnosis not present

## 2019-09-24 DIAGNOSIS — R531 Weakness: Secondary | ICD-10-CM | POA: Diagnosis not present

## 2019-09-27 DIAGNOSIS — J3089 Other allergic rhinitis: Secondary | ICD-10-CM | POA: Diagnosis not present

## 2019-09-30 DIAGNOSIS — M25511 Pain in right shoulder: Secondary | ICD-10-CM | POA: Diagnosis not present

## 2019-10-07 DIAGNOSIS — Z961 Presence of intraocular lens: Secondary | ICD-10-CM | POA: Diagnosis not present

## 2019-10-31 DIAGNOSIS — J3089 Other allergic rhinitis: Secondary | ICD-10-CM | POA: Diagnosis not present

## 2019-11-12 DIAGNOSIS — M79671 Pain in right foot: Secondary | ICD-10-CM | POA: Diagnosis not present

## 2019-11-12 DIAGNOSIS — S92334A Nondisplaced fracture of third metatarsal bone, right foot, initial encounter for closed fracture: Secondary | ICD-10-CM | POA: Diagnosis not present

## 2019-11-13 ENCOUNTER — Ambulatory Visit: Payer: Medicare Other | Admitting: Internal Medicine

## 2019-11-19 DIAGNOSIS — E7800 Pure hypercholesterolemia, unspecified: Secondary | ICD-10-CM | POA: Diagnosis not present

## 2019-11-19 DIAGNOSIS — Z1389 Encounter for screening for other disorder: Secondary | ICD-10-CM | POA: Diagnosis not present

## 2019-11-19 DIAGNOSIS — M810 Age-related osteoporosis without current pathological fracture: Secondary | ICD-10-CM | POA: Diagnosis not present

## 2019-11-19 DIAGNOSIS — I10 Essential (primary) hypertension: Secondary | ICD-10-CM | POA: Diagnosis not present

## 2019-11-19 DIAGNOSIS — Z0000 Encounter for general adult medical examination without abnormal findings: Secondary | ICD-10-CM | POA: Diagnosis not present

## 2019-11-19 DIAGNOSIS — Z23 Encounter for immunization: Secondary | ICD-10-CM | POA: Diagnosis not present

## 2019-11-25 ENCOUNTER — Other Ambulatory Visit: Payer: Self-pay

## 2019-11-26 ENCOUNTER — Encounter: Payer: Self-pay | Admitting: Gynecology

## 2019-11-26 ENCOUNTER — Ambulatory Visit (INDEPENDENT_AMBULATORY_CARE_PROVIDER_SITE_OTHER): Payer: Medicare Other | Admitting: Gynecology

## 2019-11-26 VITALS — BP 118/76 | Ht 64.0 in | Wt 113.0 lb

## 2019-11-26 DIAGNOSIS — N952 Postmenopausal atrophic vaginitis: Secondary | ICD-10-CM

## 2019-11-26 DIAGNOSIS — Z01419 Encounter for gynecological examination (general) (routine) without abnormal findings: Secondary | ICD-10-CM

## 2019-11-26 DIAGNOSIS — M810 Age-related osteoporosis without current pathological fracture: Secondary | ICD-10-CM

## 2019-11-26 NOTE — Progress Notes (Signed)
    Madison Kramer 13-May-1946 [id number]        73 y.o.  [id number] for breast and pelvic exam  Past medical history,surgical history, problem list, medications, allergies, family history and social history were all reviewed and documented as reviewed in the EPIC chart.  ROS:  Performed with pertinent positives and negatives included in the history, assessment and plan.   Additional significant findings : None   Exam: Caryn Bee assistant Vitals:   11/26/19 0903  BP: 118/76  Weight: 113 lb (51.3 kg)  Height: 5\' 4"  (1.626 m)   Body mass index is 19.4 kg/m.  General appearance:  Normal affect, orientation and appearance. Skin: Grossly normal HEENT: Without gross lesions.  No cervical or supraclavicular adenopathy. Thyroid normal.  Lungs:  Clear without wheezing, rales or rhonchi Cardiac: RR, without RMG Abdominal:  Soft, nontender, without masses, guarding, rebound, organomegaly or hernia Breasts:  Examined lying and sitting without masses, retractions, discharge or axillary adenopathy. Pelvic:  Ext, BUS, Vagina: With atrophic changes  Cervix: With atrophic changes  Uterus: Anteverted, normal size, shape and contour, midline and mobile nontender   Adnexa: Without masses or tenderness    Anus and perineum: Normal   Rectovaginal: Normal sphincter tone without palpated masses or tenderness.    Assessment/Plan:  73 y.o. [id number] female for breast and pelvic exam  1. Postmenopausal.  No significant menopausal symptoms or any vaginal bleeding. 2. Pap smear 2016.  No Pap smear done today.  No history of significant abnormal Pap smears.  We discussed current screening guidelines and she is comfortable with stop screening. 3. Osteoporosis.  DEXA at [facility] 2019 T score -2.7.  Saw Dr Cruzita Lederer last year per her office note with recommendations to repeat the DEXA at 2-year interval.  We will follow-up with Dr Cruzita Lederer after repeat of next DEXA at [facility]. 4. Colonoscopy 2018.  Repeat at their  recommended interval. 5. Mammography 09/2019.  Continue with annual mammography when due.  Breast exam normal today. 6. Health maintenance.  No routine lab work done as patient does this elsewhere.  Patient asked about follow-up exam intervals every year to every other year on Medicare.  I reviewed the issues of missed pathology with less frequent exams.  Patient will decide and schedule as she feels comfortable.     [name] MD, 9:44 AM 11/26/2019

## 2019-11-26 NOTE — Patient Instructions (Signed)
Follow-up for repeat DEXA at [facility] this coming summer at 2-year interval.  Follow-up with Dr Cruzita Lederer in reference to the osteoporosis.  Follow-up in 1 year for annual exam

## 2019-12-04 ENCOUNTER — Encounter: Payer: Medicare Other | Admitting: Gynecology

## 2019-12-16 DIAGNOSIS — R531 Weakness: Secondary | ICD-10-CM | POA: Diagnosis not present

## 2019-12-16 DIAGNOSIS — M25511 Pain in right shoulder: Secondary | ICD-10-CM | POA: Diagnosis not present

## 2019-12-18 ENCOUNTER — Ambulatory Visit: Payer: Medicare Other | Admitting: Internal Medicine

## 2019-12-19 DIAGNOSIS — L03011 Cellulitis of right finger: Secondary | ICD-10-CM | POA: Diagnosis not present

## 2019-12-19 DIAGNOSIS — L03032 Cellulitis of left toe: Secondary | ICD-10-CM | POA: Diagnosis not present

## 2019-12-26 ENCOUNTER — Ambulatory Visit: Payer: Medicare Other | Attending: Internal Medicine

## 2019-12-26 DIAGNOSIS — Z23 Encounter for immunization: Secondary | ICD-10-CM | POA: Insufficient documentation

## 2019-12-26 NOTE — Progress Notes (Signed)
   Covid-19 Vaccination Clinic  Name:  Madison Kramer    MRN: [id number] DOB: 1946/12/09  12/26/2019  Madison Kramer was observed post Covid-19 immunization for 30 minutes based on pre-vaccination screening without incidence. She was provided with Vaccine Information Sheet and instruction to access the V-Safe system.   Madison Kramer was instructed to call 911 with any severe reactions post vaccine: Marland Kitchen Difficulty breathing  . Swelling of your face and throat  . A fast heartbeat  . A bad rash all over your body  . Dizziness and weakness    Immunizations Administered    Name Date Dose VIS Date Route   Pfizer COVID-19 Vaccine 12/26/2019 11:27 AM 0.3 mL 11/22/2019 Intramuscular   Manufacturer: [place]   Lot: [id number]   [place]: [id number]

## 2019-12-27 ENCOUNTER — Encounter: Payer: Self-pay | Admitting: Internal Medicine

## 2019-12-27 ENCOUNTER — Ambulatory Visit (INDEPENDENT_AMBULATORY_CARE_PROVIDER_SITE_OTHER): Payer: Medicare Other | Admitting: Internal Medicine

## 2019-12-27 VITALS — BP 90/50 | HR 77 | Temp 97.0°F | Ht 64.0 in | Wt 111.4 lb

## 2019-12-27 DIAGNOSIS — K581 Irritable bowel syndrome with constipation: Secondary | ICD-10-CM

## 2019-12-27 DIAGNOSIS — R140 Abdominal distension (gaseous): Secondary | ICD-10-CM | POA: Diagnosis not present

## 2019-12-27 NOTE — Progress Notes (Signed)
HISTORY OF PRESENT ILLNESS:  Madison Kramer is a 74 y.o. female with irritable bowel syndrome and anxiety who presents today for "checkup" regarding her irritable bowel syndrome.  She also has a myriad of non-GI issues, questions, and concerns.  She was last seen in this office August 28, 2019 regarding irritable bowel syndrome with alternating bowel habits and bloating.  See that dictation for details.  Her last colonoscopy in 2018 revealed diverticulosis only.  There were a number of recommendations at that time as outlined.  She was prescribed Xifaxan but contacted the office shortly after initiating stating that this caused diarrhea.  She was to follow-up in 2 months but follows up at this time.  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  She tells me that her bowel habits tend to be mostly on the constipated side.  She has questions regarding probiotics, fiber type, and other therapies.  She does tell me that her primary care provider recently started Zoloft 25 mg daily.  She has questions regarding this.  She also has questions regarding her blood pressure today which was 90/50.  She feels fine.  Not dizzy.  Wonders if this is too low.  REVIEW OF SYSTEMS:  All non-GI ROS negative unless otherwise stated in the HPI except for anxiety, sinus and allergy trouble  Past Medical History:  Diagnosis Date  . Allergic rhinitis   . Anxiety   . Arthritis   . Asthma   . Atypical chest pain    Possibly from DES  . Back pain   . Cataract    Hx; of  . Diverticulosis    Left  . Elevated cholesterol   . GERD (gastroesophageal reflux disease)   . Hemoptysis 2007   From reflux pharyngitis  . History of nuclear stress test    Myoview 5/17: EF 70%, no scar or ischemia, excellent exercise capacity, low risk  . Hypertension   . Insomnia   . Lactose intolerance   . Lichen sclerosus et atrophicus of the vulva 2008   Biopsy-proven  . Nocturia   .  Osteoporosis 10/2015   T score -2.5 left femoral neck  . Pneumonia   . Raynaud's syndrome    "possible"    Past Surgical History:  Procedure Laterality Date  . BREAST SURGERY  2001   REDUCTION MAMMAPLASTY  . COLONOSCOPY    . FOOT SURGERY  2009  . HYSTEROSCOPY  2003   HYSTEROSCOPIC RESECTION OF MYOMA  . INGUINAL HERNIA REPAIR  1971   BILATERAL  . LUMBAR LAMINECTOMY/DECOMPRESSION MICRODISCECTOMY Right 08/30/2013   Procedure: Right Lumbar four-five Extraforaminal Diskectomy;  Surgeon: Hosie Spangle, MD;  Location: [facility];  Service: Neurosurgery;  Laterality: Right;  . MANDIBLE SURGERY    . MYOMECTOMY    . TONSILLECTOMY    . TUBAL LIGATION  1987    Social History [address]  reports that she quit smoking about 31 years ago. She quit after 20.00 years of use. She has never used smokeless tobacco. She reports current alcohol use of about 7.0 standard drinks of alcohol per week. She reports that she does not use drugs.  family history includes Breast cancer in her maternal aunt; Diabetes in her mother; Heart disease in her maternal uncle, mother, and paternal grandfather; Hypertension in her mother; Multiple myeloma in her father.  Allergies  Allergen Reactions  . Atorvastatin Other (See Comments)    Pt reports  causes lower extremity muscle cramps  . Codeine Nausea Only  . Erythromycin Other (See Comments)    Stomach cramping  . Sulfa Antibiotics     As a child  . Sulfamethoxazole Other (See Comments)    Unsdure of reaction. Had as a child.  . Sulfonamide Derivatives   . Tetracyclines & Related     unknown  . Tramadol Nausea Only and Other (See Comments)    Dizziness and mind racing       PHYSICAL EXAMINATION: Vital signs: BP (!) 90/50   Pulse 77   Temp (!) 97 F (36.1 C)   Ht 5' 4" (1.626 m)   Wt 111 lb 6.4 oz (50.5 kg)   BMI 19.12 kg/m   Constitutional: generally well-appearing, no acute distress Psychiatric: alert and oriented x3,  cooperative Eyes: extraocular movements intact, anicteric, conjunctiva pink Mouth: oral pharynx moist, no lesions Neck: supple no lymphadenopathy Cardiovascular: heart regular rate and rhythm, no murmur Lungs: clear to auscultation bilaterally Abdomen: soft, nontender, nondistended, no obvious ascites, no peritoneal signs, normal bowel sounds, no organomegaly Rectal: Omitted Extremities: no clubbing, cyanosis, or lower extremity edema bilaterally Skin: no lesions on visible extremities Neuro: No focal deficits.  Cranial nerves intact  ASSESSMENT:  1.  Irritable bowel syndrome.  Stable 2.  Health related anxiety 3.  Generalized anxiety 4.  Colonoscopy 2018 with diverticulosis.  Aged out of screening 5.  History of GERD.  Little in the way of symptoms now.  Last upper endoscopy 2013 was normal  6.  Non-GI concerns  PLAN:  1.  Advised to take Citrucel daily 2.  Advised probiotic course periodically is fine.  We talked about align and Florastor 3.  IBgard as needed for flares of IBS 4.  Discussed the potential benefits of Zoloft therapy, in addition to anxiety, with regards to IBS.  She will continue to be monitored by her PCP 5.  Reassured regarding her blood pressure 6.  Routine GI follow-up 1 year.  Sooner if needed. 25 minutes spent face-to-face with the patient.  Greater than 50% of the time used for counseling regarding her IBS and other non-GI concerns

## 2019-12-27 NOTE — Patient Instructions (Signed)
If you are age 73 or older, your body mass index should be between 23-30. Your Body mass index is 19.12 kg/m. If this is out of the aforementioned range listed, please consider follow up with your Primary Care Provider.  If you are age 77 or younger, your body mass index should be between 19-25. Your Body mass index is 19.12 kg/m. If this is out of the aformentioned range listed, please consider follow up with your Primary Care Provider.   Follow up in 1 year or sooner if needed.  It was a pleasure to see you today!  Dr. Henrene Pastor

## 2019-12-31 ENCOUNTER — Telehealth: Payer: Self-pay | Admitting: Cardiology

## 2019-12-31 NOTE — Telephone Encounter (Signed)
Left the pt a message to call the office back, for further assistance with call placed earlier.

## 2019-12-31 NOTE — Telephone Encounter (Signed)
Spoke with the pt and she is calling in to make an appt with Dr. Meda Coffee or an APP for sometime this week, for intermittent chest discomfort she felt over the weekend.  Pt states last Saturday she had right sided chest discomfort, that radiated to her right jaw.  Pt states she did not have any sob, doe, dizziness, diaphoresis, N/V, pre-syncopal or syncopal episodes.  Pt states she took her antacids and this was relieved after 3 hours.  Pt states this was also relieved by hydrating herself with water.  Pt states she has not had any reoccurring episodes since Saturday. Pt thinks its more GI/Gas related, but wants to make sure this is not cardiac related.  Pts last nuclear stress test was over 3 years ago, and was low risk. Pt states she can only come in at a certain time to see someone, due to her work schedule. Scheduled the pt to come in and see Truitt Merle NP for tomorrow 1/20 at 3:30 pm.  Advised the pt to arrive 15 mins prior to this appt.  Advised the pt that Dr. Meda Coffee will be in the office tomorrow as well, so if Cecille Rubin needs to consult with her, she can. Advised the pt if her symptoms worsen or reoccur between now and her scheduled appt, then she should seek immediate medical care, or refer to the ER.  Informed the pt that I will route this message to Dr. Meda Coffee to make her aware of this plan. Will also send chart prep a message to obtain records.  Pt verbalized understanding and agrees with this plan.

## 2019-12-31 NOTE — Progress Notes (Deleted)
   CARDIOLOGY OFFICE NOTE  Date:  12/31/2019    Shalynn L Milleson Date of Birth: 02/17/1946 Medical Record #[id number]  PCP:  Griffin, John, MD  Cardiologist:  Nelson  No chief complaint on file.   History of Present Illness: Madison Kramer is a 74 y.o. female who presents today for a work in visit. Seen for Dr. Nelson. Former patient of Dr. McLean's.   She has a history of HTN, HLD, chronic allergies, atypical chest pain and palpitations. Negative Cardiolite in 2011 and again in 2017. Normal EF by echo. Works as real estate agent - high stress.   She was last seen here in August of 2020 by Dr. Nelson. Has had prior coronary CTA - non obstructive CAD - negative FFR.   The patient {does/does not:200015} have symptoms concerning for COVID-19 infection (fever, chills, cough, or new shortness of breath).   Comes in today. Here with   Past Medical History:  Diagnosis Date  . Allergic rhinitis   . Anxiety   . Arthritis   . Asthma   . Atypical chest pain    Possibly from DES  . Back pain   . Cataract    Hx; of  . Diverticulosis    Left  . Elevated cholesterol   . GERD (gastroesophageal reflux disease)   . Hemoptysis 2007   From reflux pharyngitis  . History of nuclear stress test    Myoview 5/17: EF 70%, no scar or ischemia, excellent exercise capacity, low risk  . Hypertension   . Insomnia   . Lactose intolerance   . Lichen sclerosus et atrophicus of the vulva 2008   Biopsy-proven  . Nocturia   . Osteoporosis 10/2015   T score -2.5 left femoral neck  . Pneumonia   . Raynaud's syndrome    "possible"    Past Surgical History:  Procedure Laterality Date  . BREAST SURGERY  2001   REDUCTION MAMMAPLASTY  . COLONOSCOPY    . FOOT SURGERY  2009  . HYSTEROSCOPY  2003   HYSTEROSCOPIC RESECTION OF MYOMA  . INGUINAL HERNIA REPAIR  1971   BILATERAL  . LUMBAR LAMINECTOMY/DECOMPRESSION MICRODISCECTOMY Right 08/30/2013   Procedure: Right Lumbar four-five Extraforaminal  Diskectomy;  Surgeon: Robert W Nudelman, MD;  Location: [facility];  Service: Neurosurgery;  Laterality: Right;  . MANDIBLE SURGERY    . MYOMECTOMY    . TONSILLECTOMY    . TUBAL LIGATION  1987     Medications: No outpatient medications have been marked as taking for the 01/01/20 encounter (Appointment) with Gerhardt, Lori C, NP.     Allergies: Allergies  Allergen Reactions  . Atorvastatin Other (See Comments)    Pt reports causes lower extremity muscle cramps  . Codeine Nausea Only  . Erythromycin Other (See Comments)    Stomach cramping  . Sulfa Antibiotics     As a child  . Sulfamethoxazole Other (See Comments)    Unsdure of reaction. Had as a child.  . Sulfonamide Derivatives   . Tetracyclines & Related     unknown  . Tramadol Nausea Only and Other (See Comments)    Dizziness and mind racing    Social History: The patient  reports that she quit smoking about 31 years ago. She quit after 20.00 years of use. She has never used smokeless tobacco. She reports current alcohol use of about 7.0 standard drinks of alcohol per week. She reports that she does not use drugs.   Family History: The patient's ***  family history includes Breast cancer in her maternal aunt; Diabetes in her mother; Heart disease in her maternal uncle, mother, and paternal grandfather; Hypertension in her mother; Multiple myeloma in her father.   Review of Systems: Please see the history of present illness.   All other systems are reviewed and negative.   Physical Exam: VS:  There were no vitals taken for this visit. Marland Kitchen  BMI There is no height or weight on file to calculate BMI.  Wt Readings from Last 3 Encounters:  12/27/19 111 lb 6.4 oz (50.5 kg)  11/26/19 113 lb (51.3 kg)  08/28/19 114 lb 2 oz (51.8 kg)    General: Pleasant. Well developed, well nourished and in no acute distress.   HEENT: Normal.  Neck: Supple, no JVD, carotid bruits, or masses noted.  Cardiac: ***Regular rate and rhythm. No  murmurs, rubs, or gallops. No edema.  Respiratory:  Lungs are clear to auscultation bilaterally with normal work of breathing.  GI: Soft and nontender.  MS: No deformity or atrophy. Gait and ROM intact.  Skin: Warm and dry. Color is normal.  Neuro:  Strength and sensation are intact and no gross focal deficits noted.  Psych: Alert, appropriate and with normal affect.   LABORATORY DATA:  EKG:  EKG {ACTION; IS/IS QQV:[id number]} ordered today. This demonstrates ***.  Lab Results  Component Value Date   WBC 4.4 07/03/2017   HGB 13.8 07/03/2017   HCT 40.2 07/03/2017   PLT 269 07/03/2017   GLUCOSE 87 01/04/2019   CHOL 159 01/04/2019   TRIG 98 01/04/2019   HDL 72 01/04/2019   LDLCALC 67 01/04/2019   ALT 24 01/04/2019   AST 23 01/04/2019   NA 138 01/04/2019   K 4.3 01/04/2019   CL 100 01/04/2019   CREATININE 0.68 01/04/2019   BUN 21 01/04/2019   CO2 23 01/04/2019   TSH 1.230 07/03/2017     BNP (last 3 results) No results for input(s): BNP in the last 8760 hours.  ProBNP (last 3 results) No results for input(s): PROBNP in the last 8760 hours.   Other Studies Reviewed Today:  Coronary CT 11/2018 FINDINGS:  IMPRESSION: 1. Coronary calcium score of 255. This was 6 percentile for age and sex matched control.  2. Normal coronary origin with right dominance.  3. Mild CAD in the proximal and mid LAD and mid RCA. Additional analysis with CT FFR are recommended.  FFRct analysis was performed on the original cardiac CT angiogram dataset. Diagrammatic representation of the FFRct analysis is provided in a separate PDF document in PACS. This dictation was created using the PDF document and an interactive 3D model of the results. 3D model is not available in the EMR/PACS. Normal FFR range is >0.80.  1. Left Main:  No significant stenosis.  2. LAD: No significant stenosis. 3. LCX: No significant stenosis. 4. RCA: No significant stenosis.  IMPRESSION: 1.  CT FFR  analysis didn't show any significant stenosis.   Electronically Signed   By: Ena Dawley   On: 12/03/2018 17:24   Myoview Study Highlights 04/2016   Nuclear stress EF: 70%.  The study is normal.  This is a low risk study.  The left ventricular ejection fraction is hyperdynamic (>65%).  There was no ST segment deviation noted during stress.   Normal exercise nuclear stress test with no evidence of prior infarct or ischemia. Normal BP response to stress. Excellent exercise capacity.     ASSESSMENT AND PLAN:   1.  CAD -  coronary CTA on November 30, 2018 showed coronary calcium of 255 that was 87 percentile for age and sex matched control, she had mild plaque in the proximal mid LAD and mid RCA, FFR was normal.  She has not had any symptoms since then, will continue primary prevention with pravastatin.  2. Hypertension - well controlled, we will continue losartan to 25 mg daily.  3. Hyperlipidemia-she is compliant with pravastatin and fish oil.  Her most recent lipids in January 2020 showed improvement of LDL to 67, HDL 72 triglycerides 98.  We will continue the same regimen.  Her LFTs were normal.   . COVID-19 Education: The signs and symptoms of COVID-19 were discussed with the patient and how to seek care for testing (follow up with PCP or arrange E-visit).  The importance of social distancing, staying at home, hand hygiene and wearing a mask when out in public were discussed today.  Current medicines are reviewed with the patient today.  The patient does not have concerns regarding medicines other than what has been noted above.  The following changes have been made:  See above.  Labs/ tests ordered today include:   No orders of the defined types were placed in this encounter.    Disposition:   FU with *** in {gen number 0-10:310397} {Days to years:10300}.   Patient is agreeable to this plan and will call if any problems develop in the interim.    Signed: LORI GERHARDT, NP  12/31/2019 3:54 PM  [facility] [address] [place], [place]  [address] Phone: [phone] Fax: [phone]        

## 2019-12-31 NOTE — Telephone Encounter (Signed)
Pt c/o of Chest Pain: STAT if CP now or developed within 24 hours  1. Are you having CP right now?  No- had an episode on  Saturday where she had Chest Pain on right side and went up into her her jaw  2. Are you experiencing any other symptoms (ex. SOB, nausea, vomiting, sweating)? no  3. How long have you been experiencing CP? It started Saturday  4. Is your CP continuous or coming and going? It was coming and going  5. Have you taken Nitroglycerin? No- pt wants to appt to be seen- she have one 02-03-20 with Dr Meda Coffee ? pt will not be able to talk today from 2:00 to 4:00

## 2020-01-01 ENCOUNTER — Encounter: Payer: Self-pay | Admitting: Nurse Practitioner

## 2020-01-01 ENCOUNTER — Ambulatory Visit (INDEPENDENT_AMBULATORY_CARE_PROVIDER_SITE_OTHER): Payer: Medicare Other | Admitting: Nurse Practitioner

## 2020-01-01 ENCOUNTER — Ambulatory Visit: Payer: Medicare Other | Admitting: Nurse Practitioner

## 2020-01-01 ENCOUNTER — Other Ambulatory Visit: Payer: Self-pay

## 2020-01-01 VITALS — BP 120/76 | HR 63 | Ht 64.0 in | Wt 110.8 lb

## 2020-01-01 DIAGNOSIS — Z7189 Other specified counseling: Secondary | ICD-10-CM

## 2020-01-01 DIAGNOSIS — I2510 Atherosclerotic heart disease of native coronary artery without angina pectoris: Secondary | ICD-10-CM | POA: Diagnosis not present

## 2020-01-01 DIAGNOSIS — R079 Chest pain, unspecified: Secondary | ICD-10-CM

## 2020-01-01 DIAGNOSIS — K589 Irritable bowel syndrome without diarrhea: Secondary | ICD-10-CM

## 2020-01-01 NOTE — Telephone Encounter (Signed)
S/w pt due to having appt with Truitt Merle, NP today.  Pt stated did not make appt for today stated the person pt talked to was not listening. Pt was having bone density test first.

## 2020-01-01 NOTE — Progress Notes (Signed)
CARDIOLOGY OFFICE NOTE  Date:  01/01/2020    [name] Date of Birth: 1946-06-23 Medical Record #[id number]  PCP:  [name], MD  Cardiologist:  Meda Coffee   Chief Complaint  Patient presents with  . Chest Pain    Work in visit - seen for Dr. Meda Coffee    History of Present Illness: Madison Kramer is a 74 y.o. female who presents today for a work in visit. Seen for Dr. Meda Coffee. Former patient of Dr. Claris Gladden.   She has a history of atypical chest pain, palpitations - normal Cardiolite in [address] and 2017. Echo normal from 2014. Other issues include HTN and HLD. Had CT of the coronaries back in December of 2019 - non obstructive CAD - FFR was unremarkable.   Last seen in August of 2020 and felt to be doing well.   Phone call yesterday - Spoke with the pt and she is calling in to make an appt with Dr. Meda Coffee or an APP for sometime this week, for intermittent chest discomfort she felt over the weekend. Pt states last Saturday she had right sided chest discomfort, that radiated to her right jaw. Pt states she did not have any sob, doe, dizziness, diaphoresis, N/V, pre-syncopal or syncopal episodes. Pt states she took her antacids and this was relieved after 3 hours.  Pt states this was also relieved by hydrating herself with water. Pt states she has not had any reoccurring episodes since Saturday.  Pt thinks its more GI/Gas related, but wants to make sure this is not cardiac related.  Pts last nuclear stress test was over 3 years ago, and was low risk.  Pt states she can only come in at a certain time to see someone, due to her work schedule.  Scheduled the pt to come in and see Truitt Merle NP for tomorrow 1/20 at 3:30 pm.  Advised the pt to arrive 15 mins prior to this appt.  Advised the pt that Dr. Meda Coffee will be in the office tomorrow as well, so if Cecille Rubin needs to consult with her, she can.  Advised the pt if her symptoms worsen or reoccur between now and her scheduled appt, then she  should seek immediate medical care, or refer to the ER. Informed the pt that I will route this message to Dr. Meda Coffee to make her aware of this plan.  Will also send chart prep a message to obtain records. Pt verbalized understanding and agrees with this plan.   Thus added to my schedule for today.   The patient does not have symptoms concerning for COVID-19 infection (fever, chills, cough, or new shortness of breath).   Comes in today. Here alone. She really thinks she is ok.  She notes that she has had a specific kind of right sided chest pain that will go to her jaw for over 30 years - typically occurs after eating - this is what happened last Saturday. She will also have a soreness that is palpable under the left breast - this is unchanged as well. She has chronic IBS. She increased her Magnesium - she is not sure why - then saw GI last week - BP lower than typically is and she has been having diarrhea - typically has constipation. She was googling on the internet - saw that Magnesium could do this - she has stopped. She exercises every day. She has no trouble with this and no symptoms whatsoever. She remembers having the Myoview a few  years ago - we also reviewed the coronary CT 13 months. She admits to having more stress and anxiety with the pandemic and work - she was just started on Zoloft. She continues to work full time. She is asking for a "doppler" - several of her friends have had and she feels like she should consider.   Past Medical History:  Diagnosis Date  . Allergic rhinitis   . Anxiety   . Arthritis   . Asthma   . Atypical chest pain    Possibly from DES  . Back pain   . Cataract    Hx; of  . Diverticulosis    Left  . Elevated cholesterol   . GERD (gastroesophageal reflux disease)   . Hemoptysis 2007   From reflux pharyngitis  . History of nuclear stress test    Myoview 5/17: EF 70%, no scar or ischemia, excellent exercise capacity, low risk  . Hypertension   . Insomnia    . Lactose intolerance   . Lichen sclerosus et atrophicus of the vulva 2008   Biopsy-proven  . Nocturia   . Osteoporosis 10/2015   T score -2.5 left femoral neck  . Pneumonia   . Raynaud's syndrome    "possible"    Past Surgical History:  Procedure Laterality Date  . BREAST SURGERY  2001   REDUCTION MAMMAPLASTY  . COLONOSCOPY    . FOOT SURGERY  2009  . HYSTEROSCOPY  2003   HYSTEROSCOPIC RESECTION OF MYOMA  . INGUINAL HERNIA REPAIR  1971   BILATERAL  . LUMBAR LAMINECTOMY/DECOMPRESSION MICRODISCECTOMY Right 08/30/2013   Procedure: Right Lumbar four-five Extraforaminal Diskectomy;  Surgeon: Hosie Spangle, MD;  Location: [facility];  Service: Neurosurgery;  Laterality: Right;  . MANDIBLE SURGERY    . MYOMECTOMY    . TONSILLECTOMY    . TUBAL LIGATION  1987     Medications: Current Meds  Medication Sig  . Calcium Carbonate-Vitamin D (CALCIUM + D PO) Take 1,200 mg by mouth daily.   . Cholecalciferol (VITAMIN D PO) Take 2,000 Units by mouth daily.   . fluticasone (FLONASE) 50 MCG/ACT nasal spray Place 1 spray into both nostrils as needed.   . Glucosamine-Chondroitin-MSM 500-200-150 MG TABS Take 1 tablet by mouth 2 (two) times daily.  Marland Kitchen losartan (COZAAR) 25 MG tablet Take 1 tablet (25 mg total) by mouth daily.  . Magnesium 250 MG TABS Take 250 mg by mouth daily.  . meloxicam (MOBIC) 7.5 MG tablet Take 7.5 mg by mouth daily.  . Omega-3 Fatty Acids (FISH OIL PO) Take 2 capsules by mouth at bedtime.  . pantoprazole (PROTONIX) 40 MG tablet Take 1 tablet by mouth every morning.  . pravastatin (PRAVACHOL) 20 MG tablet Take 1 tablet (20 mg total) by mouth at bedtime.  . Probiotic Product (PROBIOTIC DAILY PO) Take 1 tablet by mouth daily.  . sertraline (ZOLOFT) 25 MG tablet Take 25 mg by mouth daily.  . temazepam (RESTORIL) 15 MG capsule Take 15 mg by mouth at bedtime as needed.  . TURMERIC PO Take 1 tablet by mouth daily.     Allergies: Allergies  Allergen Reactions  .  Atorvastatin Other (See Comments)    Pt reports causes lower extremity muscle cramps  . Codeine Nausea Only  . Erythromycin Other (See Comments)    Stomach cramping  . Sulfa Antibiotics     As a child  . Sulfamethoxazole Other (See Comments)    Unsdure of reaction. Had as a child.  . Sulfonamide  Derivatives   . Tetracyclines & Related     unknown  . Tramadol Nausea Only and Other (See Comments)    Dizziness and mind racing    Social History: The patient  reports that she quit smoking about 31 years ago. She quit after 20.00 years of use. She has never used smokeless tobacco. She reports current alcohol use of about 7.0 standard drinks of alcohol per week. She reports that she does not use drugs.   Family History: The patient's family history includes Breast cancer in her maternal aunt; Diabetes in her mother; Heart disease in her maternal uncle, mother, and paternal grandfather; Hypertension in her mother; Multiple myeloma in her father.   Review of Systems: Please see the history of present illness.   All other systems are reviewed and negative.   Physical Exam: VS:  BP 120/76   Pulse 63   Ht '5\' 4"'  (1.626 m)   Wt 110 lb 12.8 oz (50.3 kg)   SpO2 98%   BMI 19.02 kg/m  .  BMI Body mass index is 19.02 kg/m.  Wt Readings from Last 3 Encounters:  01/01/20 110 lb 12.8 oz (50.3 kg)  12/27/19 111 lb 6.4 oz (50.5 kg)  11/26/19 113 lb (51.3 kg)    General: Alert and in no acute distress. Thin.   HEENT: Normal.  Neck: Supple, no JVD, carotid bruits, or masses noted.  Cardiac: Regular rate and rhythm. No murmurs, rubs, or gallops. No edema.  Respiratory:  Lungs are clear to auscultation bilaterally with normal work of breathing.  GI: Soft and nontender.  MS: No deformity or atrophy. Gait and ROM intact.  Skin: Warm and dry. Color is normal.  Neuro:  Strength and sensation are intact and no gross focal deficits noted.  Psych: Alert, appropriate and with normal  affect.   LABORATORY DATA:  EKG:  EKG is ordered today. This demonstrates NSR - HR is 62 and is normal.  Lab Results  Component Value Date   WBC 4.4 07/03/2017   HGB 13.8 07/03/2017   HCT 40.2 07/03/2017   PLT 269 07/03/2017   GLUCOSE 87 01/04/2019   CHOL 159 01/04/2019   TRIG 98 01/04/2019   HDL 72 01/04/2019   LDLCALC 67 01/04/2019   ALT 24 01/04/2019   AST 23 01/04/2019   NA 138 01/04/2019   K 4.3 01/04/2019   CL 100 01/04/2019   CREATININE 0.68 01/04/2019   BUN 21 01/04/2019   CO2 23 01/04/2019   TSH 1.230 07/03/2017     BNP (last 3 results) No results for input(s): BNP in the last 8760 hours.  ProBNP (last 3 results) No results for input(s): PROBNP in the last 8760 hours.   Other Studies Reviewed Today:  Coronary CT 11/2018 Coronary Arteries:  Normal coronary origin.  Right dominance.  RCA is a large dominant artery that gives rise to PDA and PLVB. There is mild calcified plaque in the mid RCA with associated stenosis 25-50%.  Left main is a large artery that gives rise to LAD and LCX arteries.  LAD is a large vessel that gives rise to one diagonal artery. There is a moderate focal calcified plaque in the proximal LAD with stenosis 25-50%, but possibly 50-69%. Mid LAD has a mild calcified plaque with stenosis 25-50%.  D1 has no significant plaque.  LCX is a non-dominant artery that gives rise to one OM1 branch. There is no plaque.  Other findings:  Normal pulmonary vein drainage into the left atrium.  Normal let atrial appendage without a thrombus.  Normal size of the pulmonary artery.  IMPRESSION: 1. Coronary calcium score of 255. This was 51 percentile for age and sex matched control.  2. Normal coronary origin with right dominance.  3. Mild CAD in the proximal and mid LAD and mid RCA. Additional analysis with CT FFR are recommended.  1. Left Main:  No significant stenosis.  2. LAD: No significant stenosis. 3. LCX: No  significant stenosis. 4. RCA: No significant stenosis.  IMPRESSION:   CT FFR analysis didn't show any significant stenosis.   Myoview Study Highlights 04/2016   Nuclear stress EF: 70%.  The study is normal.  This is a low risk study.  The left ventricular ejection fraction is hyperdynamic (>65%).  There was no ST segment deviation noted during stress.   Normal exercise nuclear stress test with no evidence of prior infarct or ischemia. Normal BP response to stress. Excellent exercise capacity.    Echo Study Conclusions 2014  - Left ventricle: The cavity size was normal. Wall thickness  was normal. Systolic function was normal. The estimated  ejection fraction was in the range of 55% to 60%. Wall  motion was normal; there were no regional wall motion  abnormalities. Doppler parameters are consistent with  abnormal left ventricular relaxation (grade 1 diastolic  dysfunction).  - Mitral valve: Valve area by pressure half-time: 1.73cm^2.  - Atrial septum: No defect or patent foramen ovale was  identified.  Impressions:   - No significant abnormalities for age.   ASSESSMENT AND PLAN:   1. Atypical chest pain - stable cardiac CT just 13 months ago - has no exertional symptoms - she has great exercise tolerance. She is reassured. Sounds more musculoskeletal.   2. Prior normal Cardiolite studies - non obstructive CAD per coronary CT from 11/2018 - FFR was negative - would favor aggressive primary prevention.  I did recommend a vascular screening - she is agreeable.   3. HLD - on statin  4. IBS - per GI  5. COVID-19 Education: The signs and symptoms of COVID-19 were discussed with the patient and how to seek care for testing (follow up with PCP or arrange E-visit).  The importance of social distancing, staying at home, hand hygiene and wearing a mask when out in public were discussed today.  Current medicines are reviewed with the patient today.  The  patient does not have concerns regarding medicines other than what has been noted above.  The following changes have been made:  See above.  Labs/ tests ordered today include:    Orders Placed This Encounter  Procedures  . VAS US VASCUSCREEN     Disposition:   FU with Dr. Meda Coffee as planned.   Patient is agreeable to this plan and will call if any problems develop in the interim.   SignedTruitt Merle, NP  01/01/2020 3:53 PM  [facility] [address] [place], [place]  [address] Phone: [phone] Fax: [phone]

## 2020-01-01 NOTE — Patient Instructions (Addendum)
After Visit Summary:  We will be checking the following labs today - NONE   Medication Instructions:    Continue with your current medicines.    If you need a refill on your cardiac medications before your next appointment, please call your pharmacy.     Testing/Procedures To Be Arranged:  Vascular screening  Follow-Up:   See Dr. Meda Coffee as planned    At [facility], you and your health needs are our priority.  As part of our continuing mission to provide you with exceptional heart care, we have created designated Provider Care Teams.  These Care Teams include your primary Cardiologist (physician) and Advanced Practice Providers (APPs -  Physician Assistants and Nurse Practitioners) who all work together to provide you with the care you need, when you need it.  Special Instructions:  . Stay safe, stay home, wash your hands for at least 20 seconds and wear a mask when out in public.  . It was good to talk with you today.    Call the [place] office at [phone] if you have any questions, problems or concerns.

## 2020-01-03 DIAGNOSIS — J3089 Other allergic rhinitis: Secondary | ICD-10-CM | POA: Diagnosis not present

## 2020-01-03 DIAGNOSIS — H1045 Other chronic allergic conjunctivitis: Secondary | ICD-10-CM | POA: Diagnosis not present

## 2020-01-03 DIAGNOSIS — J4530 Mild persistent asthma, uncomplicated: Secondary | ICD-10-CM | POA: Diagnosis not present

## 2020-01-03 DIAGNOSIS — K219 Gastro-esophageal reflux disease without esophagitis: Secondary | ICD-10-CM | POA: Diagnosis not present

## 2020-01-03 NOTE — Addendum Note (Signed)
Addended by: Maren Beach, Arlita Buffkin A on: 01/03/2020 11:04 AM   Modules accepted: Orders

## 2020-01-07 DIAGNOSIS — F418 Other specified anxiety disorders: Secondary | ICD-10-CM | POA: Diagnosis not present

## 2020-01-10 ENCOUNTER — Telehealth: Payer: Self-pay | Admitting: Nurse Practitioner

## 2020-01-10 ENCOUNTER — Other Ambulatory Visit: Payer: Self-pay

## 2020-01-10 ENCOUNTER — Ambulatory Visit (HOSPITAL_COMMUNITY)
Admission: RE | Admit: 2020-01-10 | Discharge: 2020-01-10 | Disposition: A | Discharge status: Home / Self Care | Payer: Medicare Other | Source: Ambulatory Visit | Attending: Internal Medicine | Admitting: Internal Medicine

## 2020-01-10 DIAGNOSIS — R079 Chest pain, unspecified: Secondary | ICD-10-CM

## 2020-01-10 DIAGNOSIS — I2510 Atherosclerotic heart disease of native coronary artery without angina pectoris: Secondary | ICD-10-CM

## 2020-01-10 NOTE — Telephone Encounter (Signed)
Returned call to pt and she has been made aware of her Vascular test results and verbalized understanding.

## 2020-01-10 NOTE — Telephone Encounter (Signed)
Patient is returning phone call regarding results. She states if she misses the callback a detailed message can be left with the results.

## 2020-01-10 NOTE — Addendum Note (Signed)
Addended by: Burtis Junes on: 01/10/2020 08:15 AM   Modules accepted: Orders

## 2020-01-13 ENCOUNTER — Ambulatory Visit: Payer: Medicare Other

## 2020-01-13 DIAGNOSIS — R531 Weakness: Secondary | ICD-10-CM | POA: Diagnosis not present

## 2020-01-13 DIAGNOSIS — M25511 Pain in right shoulder: Secondary | ICD-10-CM | POA: Diagnosis not present

## 2020-01-14 NOTE — Addendum Note (Signed)
Addended by: Janan Halter F on: 01/14/2020 09:55 AM   Modules accepted: Orders

## 2020-01-16 ENCOUNTER — Ambulatory Visit: Payer: Medicare Other | Attending: Internal Medicine

## 2020-01-16 DIAGNOSIS — Z23 Encounter for immunization: Secondary | ICD-10-CM | POA: Insufficient documentation

## 2020-01-16 NOTE — Progress Notes (Signed)
   Covid-19 Vaccination Clinic  Name:  Madison Kramer    MRN: [id number] DOB: 09-06-1946  01/16/2020  Ms. Garden was observed post Covid-19 immunization for 30 minutes based on pre-vaccination screening without incidence. She was provided with Vaccine Information Sheet and instruction to access the V-Safe system.   [name] was instructed to call 911 with any severe reactions post vaccine: Marland Kitchen Difficulty breathing  . Swelling of your face and throat  . A fast heartbeat  . A bad rash all over your body  . Dizziness and weakness    Immunizations Administered    Name Date Dose VIS Date Route   Pfizer COVID-19 Vaccine 01/16/2020  1:44 PM 0.3 mL 11/22/2019 Intramuscular   Manufacturer: [place]   Lot: [id number]   [place]: [id number]

## 2020-01-21 DIAGNOSIS — J3089 Other allergic rhinitis: Secondary | ICD-10-CM | POA: Diagnosis not present

## 2020-01-24 DIAGNOSIS — J3089 Other allergic rhinitis: Secondary | ICD-10-CM | POA: Diagnosis not present

## 2020-01-27 DIAGNOSIS — R531 Weakness: Secondary | ICD-10-CM | POA: Diagnosis not present

## 2020-01-27 DIAGNOSIS — M25511 Pain in right shoulder: Secondary | ICD-10-CM | POA: Diagnosis not present

## 2020-02-03 ENCOUNTER — Other Ambulatory Visit: Payer: Self-pay

## 2020-02-03 ENCOUNTER — Ambulatory Visit (INDEPENDENT_AMBULATORY_CARE_PROVIDER_SITE_OTHER): Payer: Medicare Other | Admitting: Cardiology

## 2020-02-03 ENCOUNTER — Encounter: Payer: Self-pay | Admitting: Cardiology

## 2020-02-03 VITALS — BP 116/62 | HR 71 | Ht 64.0 in | Wt 112.8 lb

## 2020-02-03 DIAGNOSIS — I2510 Atherosclerotic heart disease of native coronary artery without angina pectoris: Secondary | ICD-10-CM | POA: Diagnosis not present

## 2020-02-03 LAB — LIPID PANEL
Chol/HDL Ratio: 3.1 ratio (ref 0.0–4.4)
Cholesterol, Total: 194 mg/dL (ref 100–199)
HDL: 63 mg/dL (ref >39)
LDL Chol Calc (NIH): 112 mg/dL — ABNORMAL HIGH (ref 0–99)
Triglycerides: 107 mg/dL (ref 0–149)
VLDL Cholesterol Cal: 19 mg/dL (ref 5–40)

## 2020-02-03 LAB — CBC WITH DIFFERENTIAL/PLATELET
Basophils Absolute: 0.1 10*3/uL (ref 0.0–0.2)
Basos: 2 %
EOS (ABSOLUTE): 0.2 10*3/uL (ref 0.0–0.4)
Eos: 3 %
Hematocrit: 39.7 % (ref 34.0–46.6)
Hemoglobin: 13.5 g/dL (ref 11.1–15.9)
Immature Grans (Abs): 0 10*3/uL (ref 0.0–0.1)
Immature Granulocytes: 0 %
Lymphocytes Absolute: 1.4 10*3/uL (ref 0.7–3.1)
Lymphs: 30 %
MCH: 30.6 pg (ref 26.6–33.0)
MCHC: 34 g/dL (ref 31.5–35.7)
MCV: 90 fL (ref 79–97)
Monocytes Absolute: 0.6 10*3/uL (ref 0.1–0.9)
Monocytes: 14 %
Neutrophils Absolute: 2.3 10*3/uL (ref 1.4–7.0)
Neutrophils: 51 %
Platelets: 292 10*3/uL (ref 150–450)
RBC: 4.41 x10E6/uL (ref 3.77–5.28)
RDW: 12 % (ref 11.7–15.4)
WBC: 4.5 10*3/uL (ref 3.4–10.8)

## 2020-02-03 LAB — COMPREHENSIVE METABOLIC PANEL
ALT: 16 IU/L (ref 0–32)
AST: 20 IU/L (ref 0–40)
Albumin/Globulin Ratio: 1.8 (ref 1.2–2.2)
Albumin: 4.2 g/dL (ref 3.7–4.7)
Alkaline Phosphatase: 44 IU/L (ref 39–117)
BUN/Creatinine Ratio: 37 — ABNORMAL HIGH (ref 12–28)
BUN: 28 mg/dL — ABNORMAL HIGH (ref 8–27)
Bilirubin Total: 0.4 mg/dL (ref 0.0–1.2)
CO2: 23 mmol/L (ref 20–29)
Calcium: 9.3 mg/dL (ref 8.7–10.3)
Chloride: 102 mmol/L (ref 96–106)
Creatinine, Ser: 0.76 mg/dL (ref 0.57–1.00)
GFR calc Af Amer: 90 mL/min/{1.73_m2} (ref >59)
GFR calc non Af Amer: 78 mL/min/{1.73_m2} (ref >59)
Globulin, Total: 2.4 g/dL (ref 1.5–4.5)
Glucose: 90 mg/dL (ref 65–99)
Potassium: 4.6 mmol/L (ref 3.5–5.2)
Sodium: 138 mmol/L (ref 134–144)
Total Protein: 6.6 g/dL (ref 6.0–8.5)

## 2020-02-03 NOTE — Progress Notes (Signed)
CARDIOLOGY OFFICE NOTE  Date:  02/04/2020   Madison Kramer Date of Birth: June 13, 1946 Medical Record #[id number]  PCP:  [name], MD  Cardiologist:  Meda Coffee   Reason for visit: 6 months follow up  History of Present Illness: Madison Kramer is a 74 y.o. female with history of atypical chest pain, palpitations, HTN and HLD. Had CT of the coronaries back in December of 2019 - mild non obstructive CAD in the proximal and mid LAD and mid RCA- FFR was normal.  She was seen in January 2021 by Truitt Merle for right sided chest discomfort, that radiated to her right jaw. She was reassured, her symptoms were non-exertional. Today she states that she remains active and asymptomatic. She is complaint with her meds and has no side effects. She is trying to follow heart healthy diet.   Past Medical History:  Diagnosis Date  . Allergic rhinitis   . Anxiety   . Arthritis   . Asthma   . Atypical chest pain    Possibly from DES  . Back pain   . Cataract    Hx; of  . Diverticulosis    Left  . Elevated cholesterol   . GERD (gastroesophageal reflux disease)   . Hemoptysis 2007   From reflux pharyngitis  . History of nuclear stress test    Myoview 5/17: EF 70%, no scar or ischemia, excellent exercise capacity, low risk  . Hypertension   . Insomnia   . Lactose intolerance   . Lichen sclerosus et atrophicus of the vulva 2008   Biopsy-proven  . Nocturia   . Osteoporosis 10/2015   T score -2.5 left femoral neck  . Pneumonia   . Raynaud's syndrome    "possible"    Past Surgical History:  Procedure Laterality Date  . BREAST SURGERY  2001   REDUCTION MAMMAPLASTY  . COLONOSCOPY    . FOOT SURGERY  2009  . HYSTEROSCOPY  2003   HYSTEROSCOPIC RESECTION OF MYOMA  . INGUINAL HERNIA REPAIR  1971   BILATERAL  . LUMBAR LAMINECTOMY/DECOMPRESSION MICRODISCECTOMY Right 08/30/2013   Procedure: Right Lumbar four-five Extraforaminal Diskectomy;  Surgeon: Hosie Spangle, MD;  Location: [facility];  Service: Neurosurgery;  Laterality: Right;  . MANDIBLE SURGERY    . MYOMECTOMY    . TONSILLECTOMY    . TUBAL LIGATION  1987   Medications: Current Meds  Medication Sig  . Ascorbic Acid (VITAMIN C) 1000 MG tablet Take 3,000 mg by mouth daily.  . Calcium Carbonate-Vitamin D (CALCIUM + D PO) Take 1,200 mg by mouth daily.   . Cholecalciferol (VITAMIN D PO) Take 2,000 Units by mouth daily.   Marland Kitchen EPINEPHrine 0.3 mg/0.3 mL IJ SOAJ injection USE AS DIRECTED AS NEEDED SYSTEMIC REACTION  . fluticasone (FLONASE) 50 MCG/ACT nasal spray Place 1 spray into both nostrils as needed.   . Glucosamine-Chondroitin-MSM 500-200-150 MG TABS Take 1 tablet by mouth 2 (two) times daily.  Marland Kitchen losartan (COZAAR) 25 MG tablet Take 1 tablet (25 mg total) by mouth daily.  . Magnesium 250 MG TABS Take 250 mg by mouth daily.  . meloxicam (MOBIC) 7.5 MG tablet Take 7.5 mg by mouth daily.  . Multiple Vitamins-Minerals (ZINC PO) Take 1 tablet by mouth daily.  . Omega-3 Fatty Acids (FISH OIL PO) Take 2 capsules by mouth at bedtime.  . pravastatin (PRAVACHOL) 20 MG tablet Take 1 tablet (20 mg total) by mouth at bedtime.  . Probiotic Product (PROBIOTIC DAILY PO)  Take 1 tablet by mouth daily.  . sertraline (ZOLOFT) 50 MG tablet Take 50 mg by mouth daily.  . temazepam (RESTORIL) 15 MG capsule Take 15 mg by mouth at bedtime as needed.  . TURMERIC PO Take 1 tablet by mouth daily.   Allergies: Allergies  Allergen Reactions  . Atorvastatin Other (See Comments)    Pt reports causes lower extremity muscle cramps  . Codeine Nausea Only  . Erythromycin Other (See Comments)    Stomach cramping  . Sulfa Antibiotics     As a child  . Sulfamethoxazole Other (See Comments)    Unsdure of reaction. Had as a child.  . Sulfonamide Derivatives   . Tetracyclines & Related     unknown  . Tramadol Nausea Only and Other (See Comments)    Dizziness and mind racing   Social History: The patient  reports that she quit smoking  about 31 years ago. She quit after 20.00 years of use. She has never used smokeless tobacco. She reports current alcohol use of about 7.0 standard drinks of alcohol per week. She reports that she does not use drugs.   Family History: The patient's family history includes Breast cancer in her maternal aunt; Diabetes in her mother; Heart disease in her maternal uncle, mother, and paternal grandfather; Hypertension in her mother; Multiple myeloma in her father.   Review of Systems: Please see the history of present illness.   All other systems are reviewed and negative.   Physical Exam: VS:  BP 116/62   Pulse 71   Ht '5\' 4"'  (1.626 m)   Wt 112 lb 12.8 oz (51.2 kg)   SpO2 95%   BMI 19.36 kg/m  .  BMI Body mass index is 19.36 kg/m.  Wt Readings from Last 3 Encounters:  02/03/20 112 lb 12.8 oz (51.2 kg)  01/01/20 110 lb 12.8 oz (50.3 kg)  12/27/19 111 lb 6.4 oz (50.5 kg)   General: Alert and in no acute distress. Thin.   HEENT: Normal.  Neck: Supple, no JVD, carotid bruits, or masses noted.  Cardiac: Regular rate and rhythm. No murmurs, rubs, or gallops. No edema.  Respiratory:  Lungs are clear to auscultation bilaterally with normal work of breathing.  GI: Soft and nontender.  MS: No deformity or atrophy. Gait and ROM intact.  Skin: Warm and dry. Color is normal.  Neuro:  Strength and sensation are intact and no gross focal deficits noted.  Psych: Alert, appropriate and with normal affect.  LABORATORY DATA:  EKG:  EKG is ordered today. This demonstrates NSR - HR is 62 and is normal.  Lab Results  Component Value Date   WBC 4.5 02/03/2020   HGB 13.5 02/03/2020   HCT 39.7 02/03/2020   PLT 292 02/03/2020   GLUCOSE 90 02/03/2020   CHOL 194 02/03/2020   TRIG 107 02/03/2020   HDL 63 02/03/2020   LDLCALC 112 (H) 02/03/2020   ALT 16 02/03/2020   AST 20 02/03/2020   NA 138 02/03/2020   K 4.6 02/03/2020   CL 102 02/03/2020   CREATININE 0.76 02/03/2020   BUN 28 (H) 02/03/2020    CO2 23 02/03/2020   TSH 1.230 07/03/2017   BNP (last 3 results) No results for input(s): BNP in the last 8760 hours.  ProBNP (last 3 results) No results for input(s): PROBNP in the last 8760 hours.   Other Studies Reviewed Today:  Coronary CT 11/2018 Coronary Arteries:  Normal coronary origin.  Right dominance.  RCA is  a large dominant artery that gives rise to PDA and PLVB. There is mild calcified plaque in the mid RCA with associated stenosis 25-50%.  Left main is a large artery that gives rise to LAD and LCX arteries.  LAD is a large vessel that gives rise to one diagonal artery. There is a moderate focal calcified plaque in the proximal LAD with stenosis 25-50%, but possibly 50-69%. Mid LAD has a mild calcified plaque with stenosis 25-50%.  D1 has no significant plaque.  LCX is a non-dominant artery that gives rise to one OM1 branch. There is no plaque.  Other findings:  Normal pulmonary vein drainage into the left atrium.  Normal let atrial appendage without a thrombus.  Normal size of the pulmonary artery.  IMPRESSION: 1. Coronary calcium score of 255. This was 11 percentile for age and sex matched control.  2. Normal coronary origin with right dominance.  3. Mild CAD in the proximal and mid LAD and mid RCA. Additional analysis with CT FFR are recommended.  1. Left Main:  No significant stenosis.  2. LAD: No significant stenosis. 3. LCX: No significant stenosis. 4. RCA: No significant stenosis.  IMPRESSION:   CT FFR analysis didn't show any significant stenosis.   Myoview Study Highlights 04/2016   Nuclear stress EF: 70%.  The study is normal.  This is a low risk study.  The left ventricular ejection fraction is hyperdynamic (>65%).  There was no ST segment deviation noted during stress.   Normal exercise nuclear stress test with no evidence of prior infarct or ischemia. Normal BP response to stress. Excellent exercise  capacity.    Echo Study Conclusions 2014  - Left ventricle: The cavity size was normal. Wall thickness  was normal. Systolic function was normal. The estimated  ejection fraction was in the range of 55% to 60%. Wall  motion was normal; there were no regional wall motion  abnormalities. Doppler parameters are consistent with  abnormal left ventricular relaxation (grade 1 diastolic  dysfunction).  - Mitral valve: Valve area by pressure half-time: 1.73cm^2.  - Atrial septum: No defect or patent foramen ovale was  identified.  Impressions:   - No significant abnormalities for age.     ASSESSMENT AND PLAN:   1. Atypical chest pain - most probably musculoskeletal, we will continue current management with omega 3 acids, and pravastatin for now, I would refer her to the lipid clinic as she is intolerant to atorvastatin and has elevated LDL and evidence of nonobstructive CAD on coronary CTA in 2019.  2. HLD - as above  Current medicines are reviewed with the patient today.  The patient does not have concerns regarding medicines other than what has been noted above.  The following changes have been made:  See above.  Labs/ tests ordered today include:    Orders Placed This Encounter  Procedures  . Lipid panel  . CBC with Differential/Platelet  . Comprehensive metabolic panel     Disposition:   FU with Dr. Meda Coffee as planned.   Patient is agreeable to this plan and will call if any problems develop in the interim.   Signed: Ena Dawley, MD  02/04/2020 1:43 PM  [facility] [address] [place], [place]  [address] Phone: [phone] Fax: [phone]

## 2020-02-03 NOTE — Patient Instructions (Addendum)
Medication Instructions:   *If you need a refill on your cardiac medications before your next appointment, please call your pharmacy*  Lab Work: Your physician recommends that you have lab work today- CMP, CBC, Lipids,  If you have labs (blood work) drawn today and your tests are completely normal, you will receive your results only by: Marland Kitchen MyChart Message (if you have MyChart) OR . A paper copy in the mail If you have any lab test that is abnormal or we need to change your treatment, we will call you to review the results.  Follow-Up: At [facility], you and your health needs are our priority.  As part of our continuing mission to provide you with exceptional heart care, we have created designated Provider Care Teams.  These Care Teams include your primary Cardiologist (physician) and Advanced Practice Providers (APPs -  Physician Assistants and Nurse Practitioners) who all work together to provide you with the care you need, when you need it.  Your next appointment:   6 month(s)  The format for your next appointment:   In Person  Provider:   You may see Ena Dawley, MD or one of the following Advanced Practice Providers on your designated Care Team:    Melina Copa, PA-C  Ermalinda Barrios, PA-C

## 2020-02-13 ENCOUNTER — Telehealth: Payer: Self-pay | Admitting: Cardiology

## 2020-02-13 DIAGNOSIS — E7889 Other lipoprotein metabolism disorders: Secondary | ICD-10-CM

## 2020-02-13 DIAGNOSIS — I2510 Atherosclerotic heart disease of native coronary artery without angina pectoris: Secondary | ICD-10-CM

## 2020-02-13 DIAGNOSIS — I25119 Atherosclerotic heart disease of native coronary artery with unspecified angina pectoris: Secondary | ICD-10-CM

## 2020-02-13 DIAGNOSIS — Z789 Other specified health status: Secondary | ICD-10-CM

## 2020-02-13 DIAGNOSIS — E782 Mixed hyperlipidemia: Secondary | ICD-10-CM

## 2020-02-13 NOTE — Telephone Encounter (Signed)
All labs are normal except for elevated LDL 112, goal <100, I would increase pravastatin to 40 mg po daily, if she can't tolerate that I would refer her to the lipid clinic.

## 2020-02-13 NOTE — Telephone Encounter (Signed)
Patient states she is following up, requesting lab results from lab work completed on 02/03/20. Please return call to discuss.

## 2020-02-13 NOTE — Telephone Encounter (Signed)
Left the pt a message to call the office back to discuss lab results and recommendations per Dr. Meda Coffee.

## 2020-02-13 NOTE — Telephone Encounter (Signed)
-----   Message from Dorothy Spark, MD sent at 02/13/2020  5:00 PM EST ----- All labs are normal except for elevated LDL 112, goal <100, I would increase pravastatin to 40 mg po daily, if she can't tolerate that I would refer her to the lipid clinic.

## 2020-02-13 NOTE — Telephone Encounter (Signed)
Dr. Meda Coffee, pt had labs done on 2/22 and it doesn't seem they were sent to your in-basket to advise on.  Can you please review all labs from 02/03/20 and send the results to me? I will call her thereafter, once received. Thanks!

## 2020-02-14 MED ORDER — PRAVASTATIN SODIUM 40 MG PO TABS: 40.0000 mg | ORAL_TABLET | Freq: Every evening | ORAL | 1 refills | Status: DC

## 2020-02-14 NOTE — Telephone Encounter (Signed)
Spoke with the pt and informed her that per Dr. Meda Coffee, all her labs were normal except for elevated LDL 112 and goal is <100.  Informed the pt that per Dr. Meda Coffee, she recommends that we increase her pravastatin to 40 mg po daily, and if she can't tolerate this, then refer her to our lipid clinic. Pt states she is still taking her pravastatin, but is becoming intolerant to this, but would like the dose increase called into [facility], and be referred to lipid clinic as well, so that they can manage and get her on a more comfortable and tolerable regimen.  So pt states she will increase the pravastatin but will also be referred to lipid clinic, for she states she knows the increase of this medication is going to cause her some issues.   Pt states she only wants a month supply of increased Pravastatin 40 mg po daily called into her confirmed pharmacy of choice, for when she comes in to see lipid clinic, they may take this away and hopefully place her on a more tolerable regimen.  Informed the pt that I will call this in for her, as well as go ahead and refer her to lipid clinic, for pravastatin is causing her some mild issues like aches.  Informed the pt that I will make Dr. Meda Coffee aware that she wants to increase the Pravastatin and be referred to lipid clinic too.  Pt states she is in [place], and needs to be on recommended pravastatin dose increase, until she gets back in town in the next week or so, and until she can see lipid clinic.  Informed the pt that I will place the referral for lipid clinic in the system, and send a message to schedulers to call her back and arrange this appt.  Pt verbalized understanding and agrees with this plan.

## 2020-02-14 NOTE — Telephone Encounter (Signed)
Follow Up: ° ° ° °Returning your call from yesterday. °

## 2020-02-14 NOTE — Telephone Encounter (Signed)
Pt will see Pharmacist in lipid clinic for new consult, on 3/16 at 1030. Pt made aware of appt date and time by [facility] scheduling.

## 2020-02-24 DIAGNOSIS — R531 Weakness: Secondary | ICD-10-CM | POA: Diagnosis not present

## 2020-02-24 DIAGNOSIS — M25511 Pain in right shoulder: Secondary | ICD-10-CM | POA: Diagnosis not present

## 2020-02-24 NOTE — Progress Notes (Signed)
Patient ID: Madison Kramer                 DOB: 1946-11-25                    MRN: [id number]     HPI: Madison Kramer is a 74 y.o. female patient referred to lipid clinic by Dr. Meda Coffee. PMH is significant for hx of atypical chest pain, palpitations, HTN, and HLD. 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 Calcium score was 255 (87th percentile). She was seen in January 2021 by Truitt Merle for right sided chest discomfort, that radiated to her right jaw, but her symptoms were non-exertional. She last saw Dr. Meda Coffee on 02/03/20. She reported no chest pain or side effects to pravastatin 77m, but LDL was elevated so pravastatin was increased to 428mdaily on 02/14/20. Pt called the office on 02/13/20 where she reported becoming intolerant to pravastatin but wanted to try increasing the dose.  Patient arrives today for initial visit. She endorses tolerating pravastatin very well since increasing the dose to 4034m week ago. She has had some acid-reflux but thinks this may be due to something she ate and not the medication. She states that she had muscle cramps with atorvastatin in the past but has not tried any other statins otherwise. She has also not had any insurance coverage issues until earlier this year when [facility] had to start using [facility] since [facility] is [facility]. She endorses being very diligent in maintaining a regular exercise regimen and a heart-healthy diet low in saturated fats, so she is unsure why her LDL is elevated.  Current Medications: pravastatin 40m78mily (started Jan 2021), omega 3 acids  Intolerances: atorvastatin 20-40mg daily (myalgias), pravastatin 20mg23mly (ineffective)  Risk Factors: ASCVD (nonobstructive CAD and positive calcium score 225 on CCTA), HTN, HLD, family history, former smoker  LDL goal: < 70 mg/dL  Diet: She mainly sticks to a gluten-free diet and uses honey as a sugar  substitute. Eats oatmeal, berries, bananas, gluten-free waffles, egg-white omelet, or avocado toast for breakfast. Does not normally eat lunch. Eats a lot of fish, salad with vinaigrette, or baked potato/sweet potato for dinner.  Exercise: Sticks to an exercise regimen rotation. On day 1, she uses stationary bike for 20 minutes, treadmill for 10 min, Stair Master for 10 min, weights, and core exercises. On day 2, she walks for 40 minutes. On day 3, she swims for 20 minutes.  Family History: The patient's family history includes Breast cancer in her maternal aunt; Diabetes in her mother; Heart disease in her maternal uncle, mother, and paternal grandfather; Hypertension in her mother; Multiple myeloma in her father.   Social History: The patient  reports that she quit smoking about 31 years ago. She quit after 20.00 years of use. She has never used smokeless tobacco. She reports current alcohol use of about 7.0 standard drinks of alcohol (Vodka) per week. She reports that she does not use drugs.  Labs: 02/03/20: TC 194, TG 107, HDL 63, LDL 112 (pravastatin 20mg 17my, omega 3 acids) 01/04/19: TC 159, TG 98, HDL 72, LDL 67 (atorvastatin of unclear dose per pt) 10/30/18: TC 252, TG 130, HDL 74, LDL 152 (atorvastatin of unclear dose per pt)  Past Medical History:  Diagnosis Date  . Allergic rhinitis   . Anxiety   . Arthritis   .  Asthma   . Atypical chest pain    Possibly from DES  . Back pain   . Cataract    Hx; of  . Diverticulosis    Left  . Elevated cholesterol   . GERD (gastroesophageal reflux disease)   . Hemoptysis 2007   From reflux pharyngitis  . History of nuclear stress test    Myoview 5/17: EF 70%, no scar or ischemia, excellent exercise capacity, low risk  . Hypertension   . Insomnia   . Lactose intolerance   . Lichen sclerosus et atrophicus of the vulva 2008   Biopsy-proven  . Nocturia   . Osteoporosis 10/2015   T score -2.5 left femoral neck  . Pneumonia   . Raynaud's  syndrome    "possible"    Current Outpatient Medications on File Prior to Visit  Medication Sig Dispense Refill  . Ascorbic Acid (VITAMIN C) 1000 MG tablet Take 3,000 mg by mouth daily.    . Calcium Carbonate-Vitamin D (CALCIUM + D PO) Take 1,200 mg by mouth daily.     . Cholecalciferol (VITAMIN D PO) Take 2,000 Units by mouth daily.     Marland Kitchen EPINEPHrine 0.3 mg/0.3 mL IJ SOAJ injection USE AS DIRECTED AS NEEDED SYSTEMIC REACTION    . fluticasone (FLONASE) 50 MCG/ACT nasal spray Place 1 spray into both nostrils as needed.     . Glucosamine-Chondroitin-MSM 500-200-150 MG TABS Take 1 tablet by mouth 2 (two) times daily.    Marland Kitchen losartan (COZAAR) 25 MG tablet Take 1 tablet (25 mg total) by mouth daily. 90 tablet 2  . Magnesium 250 MG TABS Take 250 mg by mouth daily.    . meloxicam (MOBIC) 7.5 MG tablet Take 7.5 mg by mouth daily.    . Multiple Vitamins-Minerals (ZINC PO) Take 1 tablet by mouth daily.    . Omega-3 Fatty Acids (FISH OIL PO) Take 2 capsules by mouth at bedtime.    . pravastatin (PRAVACHOL) 40 MG tablet Take 1 tablet (40 mg total) by mouth every evening. 30 tablet 1  . Probiotic Product (PROBIOTIC DAILY PO) Take 1 tablet by mouth daily.    . sertraline (ZOLOFT) 50 MG tablet Take 50 mg by mouth daily.    . temazepam (RESTORIL) 15 MG capsule Take 15 mg by mouth at bedtime as needed.    . TURMERIC PO Take 1 tablet by mouth daily.     No current facility-administered medications on file prior to visit.    Allergies  Allergen Reactions  . Atorvastatin Other (See Comments)    Pt reports causes lower extremity muscle cramps  . Codeine Nausea Only  . Erythromycin Other (See Comments)    Stomach cramping  . Sulfa Antibiotics     As a child  . Sulfamethoxazole Other (See Comments)    Unsdure of reaction. Had as a child.  . Sulfonamide Derivatives   . Tetracyclines & Related     unknown  . Tramadol Nausea Only and Other (See Comments)    Dizziness and mind racing     Assessment/Plan:  1. Hyperlipidemia - LDL is elevated and above goal of < 70 mg/dL given multiple risk factors and elevated calcium score on CTA in 2019. Will stop pravastatin and start rosuvastatin 3m daily for better LDL control. Discussed alternative lipid-lowering agents such as PCSK9 inhibitors, ezetimibe, and Nexlizet that may be an option for her if she is unable to tolerate rosuvastatin or needs additional lipid-lowering agents. She does not want to have to start  the injections but appreciates the information. Discussed the importance of maintaining a regular exercise regimen and heart healthy diet low in saturated in fats. Will recheck fasting lipid panel and LFTs in 3 months on 05/18/20. If LDL remains above goal, will plan to increase rosuvastatin dose if pt is tolerating well.  Richardine Service, PharmD PGY1 Pharmacy Resident

## 2020-02-25 ENCOUNTER — Other Ambulatory Visit: Payer: Self-pay

## 2020-02-25 ENCOUNTER — Ambulatory Visit (INDEPENDENT_AMBULATORY_CARE_PROVIDER_SITE_OTHER): Payer: Medicare Other | Admitting: Pharmacist

## 2020-02-25 DIAGNOSIS — E782 Mixed hyperlipidemia: Secondary | ICD-10-CM

## 2020-02-25 DIAGNOSIS — I2510 Atherosclerotic heart disease of native coronary artery without angina pectoris: Secondary | ICD-10-CM

## 2020-02-25 MED ORDER — ROSUVASTATIN CALCIUM 10 MG PO TABS: 10.0000 mg | ORAL_TABLET | Freq: Every day | ORAL | 3 refills | Status: DC

## 2020-02-25 NOTE — Patient Instructions (Addendum)
It was nice to meet you today!  Your LDL is 112 mg/dL which is above your goal of < 70 mg/dL  Your cardiac CT in 2019 showed calcifications in your coronary arteries. Your doppler results in January of 2021 showed nonobstructive coronary artery disease.   STOP taking Pravastatin  START taking Rosuvastatin 10mg  daily  Continue exercising regularly and maintaining a heart healthy diet low in saturated fats.  We will recheck fasting labs on June 7th, 2021.   Please call us at [phone] if you have any questions

## 2020-02-27 DIAGNOSIS — M940 Chondrocostal junction syndrome [Tietze]: Secondary | ICD-10-CM | POA: Diagnosis not present

## 2020-02-28 DIAGNOSIS — J3089 Other allergic rhinitis: Secondary | ICD-10-CM | POA: Diagnosis not present

## 2020-03-25 DIAGNOSIS — M25511 Pain in right shoulder: Secondary | ICD-10-CM | POA: Diagnosis not present

## 2020-03-25 DIAGNOSIS — R531 Weakness: Secondary | ICD-10-CM | POA: Diagnosis not present

## 2020-03-30 DIAGNOSIS — J3089 Other allergic rhinitis: Secondary | ICD-10-CM | POA: Diagnosis not present

## 2020-04-27 DIAGNOSIS — J3089 Other allergic rhinitis: Secondary | ICD-10-CM | POA: Diagnosis not present

## 2020-05-04 DIAGNOSIS — R531 Weakness: Secondary | ICD-10-CM | POA: Diagnosis not present

## 2020-05-04 DIAGNOSIS — M25511 Pain in right shoulder: Secondary | ICD-10-CM | POA: Diagnosis not present

## 2020-05-18 ENCOUNTER — Other Ambulatory Visit: Payer: Medicare Other

## 2020-05-22 DIAGNOSIS — M545 Low back pain: Secondary | ICD-10-CM | POA: Diagnosis not present

## 2020-05-25 ENCOUNTER — Other Ambulatory Visit: Payer: Medicare Other | Admitting: *Deleted

## 2020-05-25 ENCOUNTER — Other Ambulatory Visit: Payer: Self-pay

## 2020-05-25 DIAGNOSIS — E782 Mixed hyperlipidemia: Secondary | ICD-10-CM | POA: Diagnosis not present

## 2020-05-25 DIAGNOSIS — R531 Weakness: Secondary | ICD-10-CM | POA: Diagnosis not present

## 2020-05-25 DIAGNOSIS — M25511 Pain in right shoulder: Secondary | ICD-10-CM | POA: Diagnosis not present

## 2020-05-25 LAB — LIPID PANEL
Chol/HDL Ratio: 2.1 ratio (ref 0.0–4.4)
Cholesterol, Total: 168 mg/dL (ref 100–199)
HDL: 80 mg/dL (ref >39)
LDL Chol Calc (NIH): 73 mg/dL (ref 0–99)
Triglycerides: 84 mg/dL (ref 0–149)
VLDL Cholesterol Cal: 15 mg/dL (ref 5–40)

## 2020-05-25 LAB — HEPATIC FUNCTION PANEL
ALT: 32 IU/L (ref 0–32)
AST: 29 IU/L (ref 0–40)
Albumin: 4.6 g/dL (ref 3.7–4.7)
Alkaline Phosphatase: 53 IU/L (ref 48–121)
Bilirubin Total: 0.5 mg/dL (ref 0.0–1.2)
Bilirubin, Direct: 0.16 mg/dL (ref 0.00–0.40)
Total Protein: 7 g/dL (ref 6.0–8.5)

## 2020-05-26 DIAGNOSIS — L821 Other seborrheic keratosis: Secondary | ICD-10-CM | POA: Diagnosis not present

## 2020-06-02 ENCOUNTER — Telehealth: Payer: Self-pay | Admitting: Cardiology

## 2020-06-02 NOTE — Telephone Encounter (Signed)
Informed pt of results. Pt verbalized understanding. 

## 2020-06-02 NOTE — Telephone Encounter (Signed)
Pt had labs drawn Monday 05/25/20 but has not gotten a call from the office to discuss her lab results yet. She would like someone from the office to call her to discuss her labs

## 2020-06-03 ENCOUNTER — Ambulatory Visit
Admission: RE | Admit: 2020-06-03 | Discharge: 2020-06-03 | Disposition: A | Discharge status: Home / Self Care | Payer: Medicare Other | Source: Ambulatory Visit | Attending: Physician Assistant | Admitting: Physician Assistant

## 2020-06-03 ENCOUNTER — Other Ambulatory Visit: Payer: Self-pay | Admitting: Physician Assistant

## 2020-06-03 DIAGNOSIS — M25552 Pain in left hip: Secondary | ICD-10-CM

## 2020-06-30 DIAGNOSIS — J3089 Other allergic rhinitis: Secondary | ICD-10-CM | POA: Diagnosis not present

## 2020-07-06 DIAGNOSIS — J3089 Other allergic rhinitis: Secondary | ICD-10-CM | POA: Diagnosis not present

## 2020-07-09 DIAGNOSIS — M25511 Pain in right shoulder: Secondary | ICD-10-CM | POA: Diagnosis not present

## 2020-07-09 DIAGNOSIS — I10 Essential (primary) hypertension: Secondary | ICD-10-CM | POA: Diagnosis not present

## 2020-07-09 DIAGNOSIS — R531 Weakness: Secondary | ICD-10-CM | POA: Diagnosis not present

## 2020-07-09 DIAGNOSIS — G4700 Insomnia, unspecified: Secondary | ICD-10-CM | POA: Diagnosis not present

## 2020-07-09 DIAGNOSIS — E7800 Pure hypercholesterolemia, unspecified: Secondary | ICD-10-CM | POA: Diagnosis not present

## 2020-07-09 DIAGNOSIS — M25552 Pain in left hip: Secondary | ICD-10-CM | POA: Diagnosis not present

## 2020-07-09 DIAGNOSIS — M810 Age-related osteoporosis without current pathological fracture: Secondary | ICD-10-CM | POA: Diagnosis not present

## 2020-07-14 DIAGNOSIS — Z20822 Contact with and (suspected) exposure to covid-19: Secondary | ICD-10-CM | POA: Diagnosis not present

## 2020-07-14 DIAGNOSIS — Z03818 Encounter for observation for suspected exposure to other biological agents ruled out: Secondary | ICD-10-CM | POA: Diagnosis not present

## 2020-07-27 DIAGNOSIS — J3089 Other allergic rhinitis: Secondary | ICD-10-CM | POA: Diagnosis not present

## 2020-07-31 DIAGNOSIS — J301 Allergic rhinitis due to pollen: Secondary | ICD-10-CM | POA: Diagnosis not present

## 2020-07-31 DIAGNOSIS — J3089 Other allergic rhinitis: Secondary | ICD-10-CM | POA: Diagnosis not present

## 2020-08-03 ENCOUNTER — Other Ambulatory Visit: Payer: Self-pay | Admitting: Internal Medicine

## 2020-08-03 DIAGNOSIS — M810 Age-related osteoporosis without current pathological fracture: Secondary | ICD-10-CM

## 2020-08-04 DIAGNOSIS — N3090 Cystitis, unspecified without hematuria: Secondary | ICD-10-CM | POA: Diagnosis not present

## 2020-08-05 DIAGNOSIS — Z23 Encounter for immunization: Secondary | ICD-10-CM | POA: Diagnosis not present

## 2020-08-06 ENCOUNTER — Ambulatory Visit
Admission: RE | Admit: 2020-08-06 | Discharge: 2020-08-06 | Disposition: A | Discharge status: Home / Self Care | Payer: Medicare Other | Source: Ambulatory Visit | Attending: Internal Medicine | Admitting: Internal Medicine

## 2020-08-06 ENCOUNTER — Other Ambulatory Visit: Payer: Self-pay

## 2020-08-06 DIAGNOSIS — M810 Age-related osteoporosis without current pathological fracture: Secondary | ICD-10-CM

## 2020-08-06 DIAGNOSIS — Z780 Asymptomatic menopausal state: Secondary | ICD-10-CM | POA: Diagnosis not present

## 2020-08-10 ENCOUNTER — Other Ambulatory Visit: Payer: Self-pay | Admitting: Cardiology

## 2020-08-10 DIAGNOSIS — I10 Essential (primary) hypertension: Secondary | ICD-10-CM

## 2020-08-10 DIAGNOSIS — R0600 Dyspnea, unspecified: Secondary | ICD-10-CM

## 2020-08-10 DIAGNOSIS — E782 Mixed hyperlipidemia: Secondary | ICD-10-CM

## 2020-08-12 ENCOUNTER — Other Ambulatory Visit: Payer: Self-pay

## 2020-08-12 DIAGNOSIS — R0600 Dyspnea, unspecified: Secondary | ICD-10-CM

## 2020-08-12 DIAGNOSIS — I10 Essential (primary) hypertension: Secondary | ICD-10-CM

## 2020-08-12 DIAGNOSIS — E782 Mixed hyperlipidemia: Secondary | ICD-10-CM

## 2020-08-12 MED ORDER — LOSARTAN POTASSIUM 25 MG PO TABS: 25.0000 mg | ORAL_TABLET | Freq: Every day | ORAL | 1 refills | Status: DC

## 2020-08-24 DIAGNOSIS — M542 Cervicalgia: Secondary | ICD-10-CM | POA: Diagnosis not present

## 2020-08-24 DIAGNOSIS — M5136 Other intervertebral disc degeneration, lumbar region: Secondary | ICD-10-CM | POA: Diagnosis not present

## 2020-08-24 DIAGNOSIS — M4316 Spondylolisthesis, lumbar region: Secondary | ICD-10-CM | POA: Diagnosis not present

## 2020-08-24 DIAGNOSIS — M47816 Spondylosis without myelopathy or radiculopathy, lumbar region: Secondary | ICD-10-CM | POA: Diagnosis not present

## 2020-08-24 DIAGNOSIS — G8929 Other chronic pain: Secondary | ICD-10-CM | POA: Diagnosis not present

## 2020-08-24 DIAGNOSIS — M545 Low back pain: Secondary | ICD-10-CM | POA: Diagnosis not present

## 2020-08-24 DIAGNOSIS — M4726 Other spondylosis with radiculopathy, lumbar region: Secondary | ICD-10-CM | POA: Diagnosis not present

## 2020-08-26 DIAGNOSIS — J3089 Other allergic rhinitis: Secondary | ICD-10-CM | POA: Diagnosis not present

## 2020-08-28 DIAGNOSIS — J3089 Other allergic rhinitis: Secondary | ICD-10-CM | POA: Diagnosis not present

## 2020-08-31 DIAGNOSIS — M25511 Pain in right shoulder: Secondary | ICD-10-CM | POA: Diagnosis not present

## 2020-08-31 DIAGNOSIS — J3089 Other allergic rhinitis: Secondary | ICD-10-CM | POA: Diagnosis not present

## 2020-08-31 DIAGNOSIS — M545 Low back pain: Secondary | ICD-10-CM | POA: Diagnosis not present

## 2020-08-31 DIAGNOSIS — M542 Cervicalgia: Secondary | ICD-10-CM | POA: Diagnosis not present

## 2020-09-03 DIAGNOSIS — N390 Urinary tract infection, site not specified: Secondary | ICD-10-CM | POA: Diagnosis not present

## 2020-09-07 DIAGNOSIS — J3089 Other allergic rhinitis: Secondary | ICD-10-CM | POA: Diagnosis not present

## 2020-09-08 ENCOUNTER — Other Ambulatory Visit: Payer: Self-pay

## 2020-09-08 ENCOUNTER — Other Ambulatory Visit (HOSPITAL_COMMUNITY)
Admission: RE | Admit: 2020-09-08 | Discharge: 2020-09-08 | Disposition: A | Discharge status: Home / Self Care | Payer: Medicare Other | Source: Ambulatory Visit | Attending: Obstetrics & Gynecology | Admitting: Obstetrics & Gynecology

## 2020-09-08 ENCOUNTER — Ambulatory Visit (INDEPENDENT_AMBULATORY_CARE_PROVIDER_SITE_OTHER): Payer: Medicare Other | Admitting: Obstetrics & Gynecology

## 2020-09-08 ENCOUNTER — Encounter: Payer: Self-pay | Admitting: Obstetrics & Gynecology

## 2020-09-08 ENCOUNTER — Telehealth: Payer: Self-pay

## 2020-09-08 VITALS — BP 100/64 | HR 68 | Resp 16 | Ht 63.75 in | Wt 112.0 lb

## 2020-09-08 DIAGNOSIS — R319 Hematuria, unspecified: Secondary | ICD-10-CM | POA: Diagnosis not present

## 2020-09-08 DIAGNOSIS — R3129 Other microscopic hematuria: Secondary | ICD-10-CM | POA: Diagnosis not present

## 2020-09-08 DIAGNOSIS — I2510 Atherosclerotic heart disease of native coronary artery without angina pectoris: Secondary | ICD-10-CM

## 2020-09-08 DIAGNOSIS — R140 Abdominal distension (gaseous): Secondary | ICD-10-CM

## 2020-09-08 DIAGNOSIS — Z124 Encounter for screening for malignant neoplasm of cervix: Secondary | ICD-10-CM | POA: Insufficient documentation

## 2020-09-08 DIAGNOSIS — N390 Urinary tract infection, site not specified: Secondary | ICD-10-CM

## 2020-09-08 LAB — POCT URINALYSIS DIPSTICK
Bilirubin, UA: NEGATIVE
Glucose, UA: NEGATIVE
Ketones, UA: NEGATIVE
Nitrite, UA: NEGATIVE
Protein, UA: NEGATIVE
Urobilinogen, UA: NEGATIVE E.U./dL — AB
pH, UA: 5 (ref 5.0–8.0)

## 2020-09-08 NOTE — Addendum Note (Signed)
Addended by: Megan Salon on: 09/08/2020 05:29 PM   Modules accepted: Orders

## 2020-09-08 NOTE — Telephone Encounter (Signed)
She and I discussed this extensively and by the end of the visit, she was leaning away from doing this.  It is fine to not proceed.  She is having urine testing and starting treatment for atrophy.  I will cancel the order.  Thank you.

## 2020-09-08 NOTE — Progress Notes (Signed)
GYNECOLOGY  VISIT  CC:   Recent UTIs  HPI: 74 y.o. G25P2002 Divorced White or Caucasian female here for consultation about two major issues.  First, she's had a recent UTI that was treated with generic macrobid.  She had microscopic blood in her urine with initial testing.  I do have results for this.  I do not have a culture results.  Pre reports she is still having some symptoms of urinary urgency at this time.  Does not always need to void when has urgency.  Denies dysuria.  Denies changes in her low back pain that is chronic for her.  Is not seeing blood in her urine.  She is worried that the blood in the urine is problematic of something worse.  States she is a "registered hypochondriac".  Second concern is ovarian cancer.  Has IBS-constipation for many years.  Does occasionally have bloating.  This hasn't changed for her.  Denies pain.  Denies blood in stool.  Did have colonoscopy in 2018. Not sure she wants to stop having these.  Reasoning behind this discussed.  Pt does not have family hx of ovarian disease but does have two family members with breast cancer.  On father's side, she has two first cousins with breast cancer.  She does have Ashkenazi Jewish heritage.  Has not had genetic testing.  Reviewed recommendations regarding this heritage.  Lastly, BMD showed t score -2.5  Was -2.7 prior testing but most recent BMD was done at Dr. Delene Ruffini office and not at [facility] where previously done.  Pt does not want to be on treatment at this time.    Lastly has questions about pap smear.  Shared decision making recommendations between 43 and 33 discussed.  Pt desires pap smear today.  Former patient of Dr. Zelphia Cairo and Dr. Valeta Harms prior to that time.Madison Kramer     GYNECOLOGIC HISTORY: No LMP recorded. Patient is postmenopausal. Contraception: post menopausal Menopausal hormone therapy: none  Patient Active Problem List   Diagnosis Date Noted  . CAD (coronary artery disease) 12/03/2018  .  Osteoporosis 04/14/2017  . 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 Irritable bowel syndrome with diarrhea 08/12/2014  . Hypertension   . Extrinsic asthma 06/16/2012  . Lichen sclerosus et atrophicus of the vulva   . CHRONIC RHINITIS 11/23/2009  . PULMONARY INFILTRATE INCLUDES (EOSINOPHILIA) 10/27/2009  . Hyperlipidemia 12/22/2008  . Cough 12/22/2008  . HEMOPTYSIS 12/22/2008    Past Medical History:  Diagnosis Date  . Allergic rhinitis   . Anxiety   . Arthritis   . Asthma   . Atypical chest pain    Possibly from DES  . Back pain   . Cataract    Hx; of  . Diverticulosis    Left  . Elevated cholesterol   . GERD (gastroesophageal reflux disease)   . Hemoptysis 2007   From reflux pharyngitis  . History of nuclear stress test    Myoview 5/17: EF 70%, no scar or ischemia, excellent exercise capacity, low risk  . Hypertension   . Insomnia   . Lactose intolerance   . Lichen sclerosus et atrophicus of the vulva 2008   Biopsy-proven  . Nocturia   . Osteoporosis 10/2015   T score -2.5 left femoral neck  . Pneumonia   . Raynaud's syndrome    "possible"    Past Surgical History:  Procedure Laterality Date  . BREAST SURGERY  2001   REDUCTION MAMMAPLASTY  . COLONOSCOPY    . FOOT SURGERY  2009  .  HYSTEROSCOPY  2003   HYSTEROSCOPIC RESECTION OF MYOMA  . INGUINAL HERNIA REPAIR  1971   BILATERAL  . LUMBAR LAMINECTOMY/DECOMPRESSION MICRODISCECTOMY Right 08/30/2013   Procedure: Right Lumbar four-five Extraforaminal Diskectomy;  Surgeon: Hosie Spangle, MD;  Location: [facility];  Service: Neurosurgery;  Laterality: Right;  . MANDIBLE SURGERY    . MYOMECTOMY    . TONSILLECTOMY    . TUBAL LIGATION  1987    MEDS:   Current Outpatient Medications on File Prior to Visit  Medication Sig Dispense Refill  . Ascorbic Acid (VITAMIN C) 1000 MG tablet Take 3,000 mg by mouth daily.    . Calcium Carbonate-Vitamin D (CALCIUM + D PO) Take 1,200 mg by mouth.     . Cholecalciferol  (VITAMIN D PO) Take 2,000 Units by mouth daily.     . fluticasone (FLONASE) 50 MCG/ACT nasal spray Place 1 spray into both nostrils as needed.     . Glucosamine-Chondroitin-MSM 500-200-150 MG TABS Take 1 tablet by mouth 2 (two) times daily.    Madison Kramer losartan (COZAAR) 25 MG tablet Take 1 tablet (25 mg total) by mouth daily. 90 tablet 1  . MELATONIN PO Take 5 mg by mouth. gummy    . meloxicam (MOBIC) 7.5 MG tablet Take 7.5 mg by mouth as needed.     . Multiple Vitamins-Minerals (ZINC PO) Take 1 tablet by mouth daily.    . Omega-3 Fatty Acids (FISH OIL PO) Take 2 capsules by mouth at bedtime.    . Probiotic Product (PROBIOTIC DAILY PO) Take 1 tablet by mouth 2 (two) times daily.     . rosuvastatin (CRESTOR) 10 MG tablet Take 1 tablet (10 mg total) by mouth daily. 90 tablet 3  . sertraline (ZOLOFT) 50 MG tablet Take 50 mg by mouth daily.    . TURMERIC PO Take 1 tablet by mouth 2 (two) times daily.     Madison Kramer EPINEPHrine 0.3 mg/0.3 mL IJ SOAJ injection USE AS DIRECTED AS NEEDED SYSTEMIC REACTION (Patient not taking: Reported on 09/08/2020)     No current facility-administered medications on file prior to visit.    ALLERGIES: Atorvastatin, Codeine, Erythromycin, Macrobid [nitrofurantoin], Sulfa antibiotics, Sulfamethoxazole, Sulfonamide derivatives, Tetracyclines & related, and Tramadol  Family History  Problem Relation Age of Onset  . Heart disease Mother        died at 52; ? MI  . Hypertension Mother   . Diabetes Mother   . Multiple myeloma Father   . Heart disease Paternal Grandfather        age 56 - died of MI  . Breast cancer Maternal Aunt        Age 11's  . Heart disease Maternal Uncle     SH:  Divorced, non smoker  Review of Systems  Constitutional: Negative.   HENT: Negative.   Eyes: Negative.   Respiratory: Negative.   Cardiovascular: Negative.   Gastrointestinal: Negative.   Endocrine: Negative.   Genitourinary: Positive for frequency.       Vaginal dryness  Musculoskeletal:  Negative.   Skin: Negative.   Allergic/Immunologic: Negative.   Neurological: Negative.   Hematological: Negative.   Psychiatric/Behavioral: Negative.     PHYSICAL EXAMINATION:    BP 100/64   Pulse 68   Resp 16   Ht 5' 3.75" (1.619 m)   Wt 112 lb (50.8 kg)   BMI 19.38 kg/m     General appearance: alert, cooperative and appears stated age , non-tender; bowel sounds normal; no masses,  no organomegaly  Lymph:  no inguinal LAD noted  Pelvic: External genitalia:  no lesions              Urethra:  normal appearing urethra with no masses, tenderness or lesions              Bartholins and Skenes: normal                 Vagina: normal appearing vagina with normal color and discharge, no lesions              Cervix: no lesions              Bimanual Exam:  Uterus:  normal size, contour, position, consistency, mobility, non-tender              Adnexa: no mass, fullness, tenderness              Rectovaginal: Yes.  .  Confirms.              Anus:  normal sphincter tone, no lesions  Chaperone, Terence Lux, CMA, was present for exam.  Assessment: Recent UTI with continued urinary urgency Bloating associated with long standing IBS with constipation Ashkenazi jewish heritage  Vaginal atrophic changes Osteoporosis  Plan: Pt is going to start using topical coconut oil to improve tissue integrity.  Topical vaginal estrogen cream discussed.  She does not want to use anything with cancer risk. Urine micro and culture pending Release of records signed from Dr. Delene Ruffini office Genetic testing discussed.  She is going to consider Plan repeat BMD in 2 years on one of the previous machine's where this has been done Pap smear obtained.  36 minutes of total time was spent for this patient encounter, including preparation, face-to-face counseling with the patient and coordination of care, and documentation of the encounter.

## 2020-09-08 NOTE — Telephone Encounter (Signed)
Call to patient to review benefits for ordered PUS. Patient stated that Dr. Sabra Heck told her it was entirely up to her, whether or not to proceed with PUS. Patient is wanting to verify that it is still up to her as far as how she proceeds with PUS, and that Dr. Sabra Heck does not believe it is necessary.  Dr. Sabra Heck, please advise on how you would like the patient to proceed.

## 2020-09-09 LAB — URINALYSIS, MICROSCOPIC ONLY: Casts: NONE SEEN /lpf

## 2020-09-09 LAB — CYTOLOGY - PAP: Diagnosis: NEGATIVE

## 2020-09-09 NOTE — Telephone Encounter (Signed)
Patient says she is calling to check if she should schedule ultrasound.

## 2020-09-09 NOTE — Telephone Encounter (Signed)
Spoke with patient and advised per Dr. Ammie Ferrier message below. Patient is appreciative of return call and will wait to hear back regarding results from urine testing. Encounter closed.

## 2020-09-10 ENCOUNTER — Telehealth: Payer: Self-pay

## 2020-09-10 DIAGNOSIS — J3089 Other allergic rhinitis: Secondary | ICD-10-CM | POA: Diagnosis not present

## 2020-09-10 LAB — URINE CULTURE

## 2020-09-10 MED ORDER — CEPHALEXIN 250 MG PO CAPS: 250.0000 mg | ORAL_CAPSULE | Freq: Four times a day (QID) | ORAL | 0 refills | Status: AC

## 2020-09-10 NOTE — Telephone Encounter (Signed)
Patient is still having symptoms of a uti.

## 2020-09-10 NOTE — Telephone Encounter (Signed)
Pt stopped by in person to drop off lab results from Dr Delene Ruffini office. Records placed on Dr Ammie Ferrier desk for review.   Spoke with pt. Pt given results and recommendations per Dr Sabra Heck. Pt agreeable and verbalized understanding. Pharmacy verified. Pt has OV recheck on 10/15 at 930 am. Rx sent.  02 recall placed.    Megan Salon, MD  09/10/2020 4:52 PM EDT Back to Top    Please let pt nokw her urine culture showed klebsiella. Ok to treat with keflex 250mg  qid x 5 days. Repeat exam and culture in about 10 days. Also let her know there are no red cells in her urine, only white cells. Also, pap is normal. 02 recall.  CC: Royal Hawthorn, CMA

## 2020-09-10 NOTE — Telephone Encounter (Signed)
Spoke with pt. Pt states still having UTI sx of urgency  as did at [place] on 09/08/20. Pt advised UC not resulted yet from OV. Pt verbalized understanding. Pt states Dr Delene Ruffini office was suppose to fax over his UC that was done last week and pt wants Dr Sabra Heck to review. Advised no fax has been received. Pt states will call again and have them send results.  Pt states was treated with 5 days of Macrobid from Dr Laurann Montana x 1 week ago, but had nausea and headache while taking it. Wanting to know what to take now?  Advised will review with Dr Sabra Heck and return call. Pt agreeable.  Pharmacy verified.   Routing to Dr Sabra Heck.

## 2020-09-11 DIAGNOSIS — Z23 Encounter for immunization: Secondary | ICD-10-CM | POA: Diagnosis not present

## 2020-09-14 DIAGNOSIS — T63441A Toxic effect of venom of bees, accidental (unintentional), initial encounter: Secondary | ICD-10-CM | POA: Diagnosis not present

## 2020-09-16 DIAGNOSIS — D225 Melanocytic nevi of trunk: Secondary | ICD-10-CM | POA: Diagnosis not present

## 2020-09-16 DIAGNOSIS — L821 Other seborrheic keratosis: Secondary | ICD-10-CM | POA: Diagnosis not present

## 2020-09-21 DIAGNOSIS — Z1231 Encounter for screening mammogram for malignant neoplasm of breast: Secondary | ICD-10-CM | POA: Diagnosis not present

## 2020-09-21 NOTE — Progress Notes (Signed)
GYNECOLOGY  VISIT  CC:   Cystitis recheck  HPI: 74 y.o. G59P2002 Divorced White or Caucasian female here for follow up of h/o UTI with history of dysuria and urinary pressure.  Urine culture on 09/08/2020 showed Kelbsiella.  Urine micro did not show any blood.  Results reviewed with pt.  Repeat urine culture obtained today.  She feels it will be negative as she is much improved.  She reports the dysuria and urinary pressure have resolved.  She is still having some urinary urgency but unsure if this is due to age.  Denies vaginal bleeding.  We discussed use of vaginal estrogen cream if symptoms continue after another week or two as she does have atrophic vaginal/vulvar tissue change and she is aware this can cause the same symptoms.  Still does not really want to use this if needed due to containing estrogen.  Other OTC products discussed.    At last visit, pt reported Ashkenazi jewish heritage.  Genetic testing discussed with pt.  She is going to consider this and knows to call if decides to proceed.  Would refer to Roma Kayser for consultation.    GYNECOLOGIC HISTORY: No LMP recorded. Patient is postmenopausal. Contraception: post menopausal Menopausal hormone therapy: none  Patient Active Problem List   Diagnosis Date Noted  . CAD (coronary artery disease) 12/03/2018  . Osteoporosis 04/14/2017  . 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 Irritable bowel syndrome with diarrhea 08/12/2014  . Hypertension   . Extrinsic asthma 06/16/2012  . Lichen sclerosus et atrophicus of the vulva   . CHRONIC RHINITIS 11/23/2009  . PULMONARY INFILTRATE INCLUDES (EOSINOPHILIA) 10/27/2009  . Hyperlipidemia 12/22/2008  . Cough 12/22/2008  . HEMOPTYSIS 12/22/2008    Past Medical History:  Diagnosis Date  . Allergic rhinitis   . Anxiety   . Arthritis   . Asthma   . Atypical chest pain    Possibly from DES  . Back pain   . Cataract    Hx; of  . Diverticulosis    Left  . Elevated cholesterol   . GERD  (gastroesophageal reflux disease)   . Hemoptysis 2007   From reflux pharyngitis  . History of nuclear stress test    Myoview 5/17: EF 70%, no scar or ischemia, excellent exercise capacity, low risk  . Hypertension   . Insomnia   . Lactose intolerance   . Lichen sclerosus et atrophicus of the vulva 2008   Biopsy-proven  . Nocturia   . Osteoporosis 10/2015   T score -2.5 left femoral neck  . Pneumonia   . Raynaud's syndrome    "possible"    Past Surgical History:  Procedure Laterality Date  . BREAST SURGERY  2001   REDUCTION MAMMAPLASTY  . COLONOSCOPY    . FOOT SURGERY  2009  . HYSTEROSCOPY  2003   HYSTEROSCOPIC RESECTION OF MYOMA  . INGUINAL HERNIA REPAIR  1971   BILATERAL  . LUMBAR LAMINECTOMY/DECOMPRESSION MICRODISCECTOMY Right 08/30/2013   Procedure: Right Lumbar four-five Extraforaminal Diskectomy;  Surgeon: Hosie Spangle, MD;  Location: [facility];  Service: Neurosurgery;  Laterality: Right;  . MANDIBLE SURGERY    . MYOMECTOMY    . TONSILLECTOMY    . TUBAL LIGATION  1987    MEDS:   Current Outpatient Medications on File Prior to Visit  Medication Sig Dispense Refill  . Ascorbic Acid (VITAMIN C) 1000 MG tablet Take 3,000 mg by mouth daily.    . Calcium Carbonate-Vitamin D (CALCIUM + D PO) Take 1,200  mg by mouth.     . Cholecalciferol (VITAMIN D PO) Take 2,000 Units by mouth daily.     . fluticasone (FLONASE) 50 MCG/ACT nasal spray Place 1 spray into both nostrils as needed.     . Glucosamine-Chondroitin-MSM 500-200-150 MG TABS Take 1 tablet by mouth 2 (two) times daily.    Marland Kitchen losartan (COZAAR) 25 MG tablet Take 1 tablet (25 mg total) by mouth daily. 90 tablet 1  . MELATONIN PO Take 5 mg by mouth. gummy    . meloxicam (MOBIC) 7.5 MG tablet Take 7.5 mg by mouth as needed.     . Multiple Vitamins-Minerals (ZINC PO) Take 1 tablet by mouth daily.    . Omega-3 Fatty Acids (FISH OIL PO) Take 2 capsules by mouth at bedtime.    . Probiotic Product (PROBIOTIC DAILY PO)  Take 1 tablet by mouth 2 (two) times daily.     . rosuvastatin (CRESTOR) 10 MG tablet Take 1 tablet (10 mg total) by mouth daily. 90 tablet 3  . sertraline (ZOLOFT) 50 MG tablet Take 50 mg by mouth daily.    . TURMERIC PO Take 1 tablet by mouth 2 (two) times daily.     Marland Kitchen EPINEPHrine 0.3 mg/0.3 mL IJ SOAJ injection USE AS DIRECTED AS NEEDED SYSTEMIC REACTION (Patient not taking: Reported on 09/08/2020)     No current facility-administered medications on file prior to visit.    ALLERGIES: Atorvastatin, Codeine, Erythromycin, Macrobid [nitrofurantoin], Sulfa antibiotics, Sulfamethoxazole, Sulfonamide derivatives, Tetracyclines & related, and Tramadol  Family History  Problem Relation Age of Onset  . Heart disease Mother        died at 41; ? MI  . Hypertension Mother   . Diabetes Mother   . Multiple myeloma Father   . Heart disease Paternal Grandfather        age 23 - died of MI  . Breast cancer Maternal Aunt        Age 61's  . Heart disease Maternal Uncle     SH:  Divorced, non smoker  Review of Systems  Constitutional: Negative.   HENT: Negative.   Eyes: Negative.   Respiratory: Negative.   Cardiovascular: Negative.   Gastrointestinal: Negative.   Endocrine: Negative.   Genitourinary: Negative.   Musculoskeletal: Negative.   Skin: Negative.   Allergic/Immunologic: Negative.   Neurological: Negative.   Hematological: Negative.   Psychiatric/Behavioral: Negative.     PHYSICAL EXAMINATION:    BP 98/60   Pulse 60   Resp 16   Wt 112 lb (50.8 kg)   BMI 19.38 kg/m     General appearance: alert, cooperative and appears stated age No other exam performed  Assessment: H/o cystitis that has hopefully fully resolved Pt admitted anxiety/concerns about health Ashkenazi jewish heritage  Plan: Pt will consider returning for breast and pelvic exam in 2 years.  Knows to call with any questions/concerns prior to that time.  Also knows to call if desires referral to medical  genetics.   Urine culture pending   22 minutes of total time was spent for this patient encounter, including preparation, face-to-face counseling with the patient and coordination of care, and documentation of the encounter.

## 2020-09-22 DIAGNOSIS — M545 Low back pain, unspecified: Secondary | ICD-10-CM | POA: Diagnosis not present

## 2020-09-22 DIAGNOSIS — M542 Cervicalgia: Secondary | ICD-10-CM | POA: Diagnosis not present

## 2020-09-22 DIAGNOSIS — M25511 Pain in right shoulder: Secondary | ICD-10-CM | POA: Diagnosis not present

## 2020-09-23 DIAGNOSIS — M545 Low back pain, unspecified: Secondary | ICD-10-CM | POA: Diagnosis not present

## 2020-09-23 DIAGNOSIS — M25511 Pain in right shoulder: Secondary | ICD-10-CM | POA: Diagnosis not present

## 2020-09-23 DIAGNOSIS — M542 Cervicalgia: Secondary | ICD-10-CM | POA: Diagnosis not present

## 2020-09-25 ENCOUNTER — Other Ambulatory Visit: Payer: Self-pay

## 2020-09-25 ENCOUNTER — Ambulatory Visit (INDEPENDENT_AMBULATORY_CARE_PROVIDER_SITE_OTHER): Payer: Medicare Other | Admitting: Obstetrics & Gynecology

## 2020-09-25 ENCOUNTER — Encounter: Payer: Self-pay | Admitting: Obstetrics & Gynecology

## 2020-09-25 VITALS — BP 98/60 | HR 60 | Resp 16 | Wt 112.0 lb

## 2020-09-25 DIAGNOSIS — N3000 Acute cystitis without hematuria: Secondary | ICD-10-CM | POA: Diagnosis not present

## 2020-09-25 DIAGNOSIS — I2510 Atherosclerotic heart disease of native coronary artery without angina pectoris: Secondary | ICD-10-CM | POA: Diagnosis not present

## 2020-09-25 DIAGNOSIS — Z87440 Personal history of urinary (tract) infections: Secondary | ICD-10-CM | POA: Diagnosis not present

## 2020-09-27 LAB — URINE CULTURE: Organism ID, Bacteria: NO GROWTH

## 2020-10-06 DIAGNOSIS — Z961 Presence of intraocular lens: Secondary | ICD-10-CM | POA: Diagnosis not present

## 2020-10-08 ENCOUNTER — Ambulatory Visit: Payer: Medicare Other | Admitting: Obstetrics & Gynecology

## 2020-10-08 DIAGNOSIS — J3089 Other allergic rhinitis: Secondary | ICD-10-CM | POA: Diagnosis not present

## 2020-10-20 ENCOUNTER — Encounter: Payer: Self-pay | Admitting: Obstetrics & Gynecology

## 2020-11-11 DIAGNOSIS — Z20822 Contact with and (suspected) exposure to covid-19: Secondary | ICD-10-CM | POA: Diagnosis not present

## 2020-11-17 DIAGNOSIS — J3089 Other allergic rhinitis: Secondary | ICD-10-CM | POA: Diagnosis not present

## 2020-11-17 DIAGNOSIS — H1045 Other chronic allergic conjunctivitis: Secondary | ICD-10-CM | POA: Diagnosis not present

## 2020-11-17 DIAGNOSIS — J4530 Mild persistent asthma, uncomplicated: Secondary | ICD-10-CM | POA: Diagnosis not present

## 2020-11-17 DIAGNOSIS — K219 Gastro-esophageal reflux disease without esophagitis: Secondary | ICD-10-CM | POA: Diagnosis not present

## 2020-11-20 ENCOUNTER — Other Ambulatory Visit: Payer: Self-pay | Admitting: Allergy and Immunology

## 2020-11-20 ENCOUNTER — Ambulatory Visit
Admission: RE | Admit: 2020-11-20 | Discharge: 2020-11-20 | Disposition: A | Discharge status: Home / Self Care | Payer: Medicare Other | Source: Ambulatory Visit | Attending: Allergy and Immunology | Admitting: Allergy and Immunology

## 2020-11-20 DIAGNOSIS — R059 Cough, unspecified: Secondary | ICD-10-CM

## 2020-11-24 DIAGNOSIS — J209 Acute bronchitis, unspecified: Secondary | ICD-10-CM | POA: Diagnosis not present

## 2020-11-24 DIAGNOSIS — F418 Other specified anxiety disorders: Secondary | ICD-10-CM | POA: Diagnosis not present

## 2020-11-24 DIAGNOSIS — Z0000 Encounter for general adult medical examination without abnormal findings: Secondary | ICD-10-CM | POA: Diagnosis not present

## 2020-11-24 DIAGNOSIS — M810 Age-related osteoporosis without current pathological fracture: Secondary | ICD-10-CM | POA: Diagnosis not present

## 2020-11-24 DIAGNOSIS — E7800 Pure hypercholesterolemia, unspecified: Secondary | ICD-10-CM | POA: Diagnosis not present

## 2020-11-24 DIAGNOSIS — I10 Essential (primary) hypertension: Secondary | ICD-10-CM | POA: Diagnosis not present

## 2020-11-24 DIAGNOSIS — Z1389 Encounter for screening for other disorder: Secondary | ICD-10-CM | POA: Diagnosis not present

## 2020-12-01 DIAGNOSIS — M545 Low back pain, unspecified: Secondary | ICD-10-CM | POA: Diagnosis not present

## 2020-12-01 DIAGNOSIS — M25511 Pain in right shoulder: Secondary | ICD-10-CM | POA: Diagnosis not present

## 2020-12-01 DIAGNOSIS — M542 Cervicalgia: Secondary | ICD-10-CM | POA: Diagnosis not present

## 2020-12-10 ENCOUNTER — Telehealth: Payer: Self-pay | Admitting: Internal Medicine

## 2020-12-14 NOTE — Telephone Encounter (Signed)
Left message for pt to call back.  Spoke with pt and she states she made it through the holiday weekend and does not thinks she needs anything for reflux at this time. Pt was not happy that she did not get a call back on Thursday. Discussed with pt that if she has these symptoms again there are OTC meds available for reflux such as prilosec, nexium or protonix. Pt thanked me for the call. Restore  Close  Cancel  Previous  Next    SDOH       Social Determinants of Health  Expand All Collapse All   Intimate Partner Violence    Social Connections     Not on file   Not on file    Tobacco Use    Financial Resource Strain     Sep 25 2020: Medium Risk   Not on file    Depression (ZJI9-6)    Stress     Not on file   Not on file    Physical Activity    Food Insecurity     Not on file   Not on file    Transportation Needs     Alcohol Screen  Restore  Close  Cancel  Previous  Next    SDOH       Social Determinants of Health  Expand All Collapse All   Intimate Partner Violence    Social Connections     Not on file   Not on file    Tobacco Use    Financial Resource Strain     Sep 25 2020: Medium Risk   Not on file    Depression (VEL3-8)    Stress     Not on file   Not on file    Physical Activity    Food Insecurity     Not on file   Not on file    Transportation Needs     Alcohol Screen     Not on file    Not on file    Housing      Not on file        Not on file    Not on file    Housing      Not on file

## 2020-12-18 DIAGNOSIS — J3089 Other allergic rhinitis: Secondary | ICD-10-CM | POA: Diagnosis not present

## 2020-12-21 DIAGNOSIS — J3089 Other allergic rhinitis: Secondary | ICD-10-CM | POA: Diagnosis not present

## 2020-12-25 DIAGNOSIS — J3089 Other allergic rhinitis: Secondary | ICD-10-CM | POA: Diagnosis not present

## 2021-01-01 DIAGNOSIS — J4530 Mild persistent asthma, uncomplicated: Secondary | ICD-10-CM | POA: Diagnosis not present

## 2021-01-01 DIAGNOSIS — K219 Gastro-esophageal reflux disease without esophagitis: Secondary | ICD-10-CM | POA: Diagnosis not present

## 2021-01-01 DIAGNOSIS — J3081 Allergic rhinitis due to animal (cat) (dog) hair and dander: Secondary | ICD-10-CM | POA: Diagnosis not present

## 2021-01-01 DIAGNOSIS — J3089 Other allergic rhinitis: Secondary | ICD-10-CM | POA: Diagnosis not present

## 2021-01-01 DIAGNOSIS — H1045 Other chronic allergic conjunctivitis: Secondary | ICD-10-CM | POA: Diagnosis not present

## 2021-01-01 DIAGNOSIS — J301 Allergic rhinitis due to pollen: Secondary | ICD-10-CM | POA: Diagnosis not present

## 2021-01-05 ENCOUNTER — Telehealth: Payer: Self-pay | Admitting: Internal Medicine

## 2021-01-05 NOTE — Telephone Encounter (Signed)
Yes, it is called [facility] on [employer] [address]

## 2021-01-05 NOTE — Telephone Encounter (Signed)
Called and relayed information. 

## 2021-01-05 NOTE — Telephone Encounter (Signed)
Patient states Dr Cruzita Lederer told her of an exercising place that specializes in bone building and she could not remember the name of the place she told her about, asked if someone could call her to let her know (she does not have access to her mychart) ph# [phone]

## 2021-01-05 NOTE — Telephone Encounter (Signed)
Do you recall recommendation?

## 2021-01-11 DIAGNOSIS — M545 Low back pain, unspecified: Secondary | ICD-10-CM | POA: Diagnosis not present

## 2021-01-11 DIAGNOSIS — M542 Cervicalgia: Secondary | ICD-10-CM | POA: Diagnosis not present

## 2021-01-11 DIAGNOSIS — M25511 Pain in right shoulder: Secondary | ICD-10-CM | POA: Diagnosis not present

## 2021-01-28 DIAGNOSIS — L600 Ingrowing nail: Secondary | ICD-10-CM | POA: Diagnosis not present

## 2021-02-01 DIAGNOSIS — J301 Allergic rhinitis due to pollen: Secondary | ICD-10-CM | POA: Diagnosis not present

## 2021-02-01 DIAGNOSIS — J3089 Other allergic rhinitis: Secondary | ICD-10-CM | POA: Diagnosis not present

## 2021-02-09 ENCOUNTER — Other Ambulatory Visit: Payer: Self-pay

## 2021-02-09 DIAGNOSIS — E782 Mixed hyperlipidemia: Secondary | ICD-10-CM

## 2021-02-09 DIAGNOSIS — I10 Essential (primary) hypertension: Secondary | ICD-10-CM

## 2021-02-09 DIAGNOSIS — R0600 Dyspnea, unspecified: Secondary | ICD-10-CM

## 2021-02-09 MED ORDER — LOSARTAN POTASSIUM 25 MG PO TABS: 25.0000 mg | ORAL_TABLET | Freq: Every day | ORAL | 0 refills | Status: DC

## 2021-02-09 NOTE — Telephone Encounter (Signed)
Pt's medication was sent to pt's pharmacy as requested. Confirmation received.  °

## 2021-02-10 ENCOUNTER — Telehealth: Payer: Self-pay | Admitting: Cardiology

## 2021-02-10 DIAGNOSIS — E782 Mixed hyperlipidemia: Secondary | ICD-10-CM

## 2021-02-10 DIAGNOSIS — I10 Essential (primary) hypertension: Secondary | ICD-10-CM

## 2021-02-10 DIAGNOSIS — R0600 Dyspnea, unspecified: Secondary | ICD-10-CM

## 2021-02-10 NOTE — Telephone Encounter (Signed)
*  STAT* If patient is at the pharmacy, call can be transferred to refill team.   1. Which medications need to be refilled? (please list name of each medication and dose if known) losartan 25 mg and rosuvastatin  2. Which pharmacy/location (including street and city if local pharmacy) is medication to be sent to? Patient would like to be sent to [facility] [phone]  3. Do they need a 30 day or 90 day supply? [place]

## 2021-02-11 MED ORDER — LOSARTAN POTASSIUM 25 MG PO TABS: 25.0000 mg | ORAL_TABLET | Freq: Every day | ORAL | 0 refills | Status: DC

## 2021-02-11 MED ORDER — ROSUVASTATIN CALCIUM 10 MG PO TABS: 10.0000 mg | ORAL_TABLET | Freq: Every day | ORAL | 0 refills | Status: DC

## 2021-02-11 NOTE — Telephone Encounter (Signed)
Pt's medication was sent to pt's pharmacy as requested. Confirmation received.  °

## 2021-02-15 DIAGNOSIS — M25511 Pain in right shoulder: Secondary | ICD-10-CM | POA: Diagnosis not present

## 2021-02-15 DIAGNOSIS — M545 Low back pain, unspecified: Secondary | ICD-10-CM | POA: Diagnosis not present

## 2021-02-15 DIAGNOSIS — M542 Cervicalgia: Secondary | ICD-10-CM | POA: Diagnosis not present

## 2021-02-15 NOTE — Progress Notes (Signed)
Cardiology Office Note:    Date:  02/18/2021   ID:  Madison Kramer, DOB [birth year], MRN [id number]  PCP:  Madison Kramer, [facility]  Cardiologist:  Madison Dawley, MD  Advanced Practice Provider:  No care team member to display Electrophysiologist:  None   Referring MD: Madison Orn, MD     History of Present Illness:    Madison Kramer is a 75 y.o. 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 Meda Kramer now returning to clinic for follow-up.  Last saw Dr. Meda Kramer in 02/03/20 where she was doing well. Her LDL was elevated at 112 and her pravastatin was increased to 58m daily later changed to crestor 122mdaily.   Today, the patient states that she feels well. Has arthritic pain. Exercises everyday and swims, goes to the gym, and walks usually for at least 30-60 minutes. Denies chest pain, SOB, nausea, vomiting, LE edema, lightheadedness, dizziness. Taking crestor as prescribed. Blood pressure well controlled at home.   Past Medical History:  Diagnosis Date  . Allergic rhinitis   . Anxiety   . Arthritis   . Asthma   . Atypical chest pain    Possibly from DES  . Back pain   . Cataract    Hx; of  . Diverticulosis    Left  . Elevated cholesterol   . GERD (gastroesophageal reflux disease)   . Hemoptysis 2007   From reflux pharyngitis  . History of nuclear stress test    Myoview 5/17: EF 70%, no scar or ischemia, excellent exercise capacity, low risk  . Hypertension   . Insomnia   . Lactose intolerance   . Lichen sclerosus et atrophicus of the vulva 2008   Biopsy-proven  . Nocturia   . Osteoporosis 10/2015   T score -2.5 left femoral neck  . Pneumonia   . Raynaud's syndrome    "possible"    Past Surgical History:  Procedure Laterality Date  . BREAST SURGERY  2001   REDUCTION MAMMAPLASTY  . COLONOSCOPY    . FOOT SURGERY  2009  . HYSTEROSCOPY   2003   HYSTEROSCOPIC RESECTION OF MYOMA  . INGUINAL HERNIA REPAIR  1971   BILATERAL  . LUMBAR LAMINECTOMY/DECOMPRESSION MICRODISCECTOMY Right 08/30/2013   Procedure: Right Lumbar four-five Extraforaminal Diskectomy;  Surgeon: RoHosie SpangleMD;  [facility];  Service: Neurosurgery;  Laterality: Right;  . MANDIBLE SURGERY    . MYOMECTOMY    . TONSILLECTOMY    . TUBAL LIGATION  1987    Current Medications: Current Meds  Medication Sig  . Ascorbic Acid (VITAMIN C) 1000 MG tablet Take 3,000 mg by mouth daily.  . Calcium Carbonate-Vitamin D (CALCIUM + D PO) Take 1,200 mg by mouth.  . Cholecalciferol (VITAMIN D PO) Take 2,000 Units by mouth daily.   . Marland KitchenPINEPHrine 0.3 mg/0.3 mL IJ SOAJ injection USE AS DIRECTED AS NEEDED SYSTEMIC REACTION  . fluticasone (FLONASE) 50 MCG/ACT nasal spray Place 1 spray into both nostrils as needed.   . Glucosamine-Chondroitin-MSM 500-200-150 MG TABS Take 1 tablet by mouth 2 (two) times daily.  . Marland KitchenELATONIN PO Take 5 mg by mouth. gummy  . meloxicam (MOBIC) 7.5 MG tablet Take 7.5 mg by mouth as needed.   . Multiple Vitamins-Minerals (ZINC PO) Take 1 tablet by mouth daily.  . Omega-3 Fatty Acids (FISH OIL PO) Take 2 capsules by  mouth at bedtime.  . Probiotic Product (PROBIOTIC DAILY PO) Take 1 tablet by mouth 2 (two) times daily.   . rosuvastatin (CRESTOR) 10 MG tablet Take 1 tablet (10 mg total) by mouth daily. Please keep upcoming appt with Dr. Meda Kramer before anymore refills. Thank you  . sertraline (ZOLOFT) 50 MG tablet Take 50 mg by mouth daily.  . TURMERIC PO Take 1 tablet by mouth 2 (two) times daily.   . [DISCONTINUED] losartan (COZAAR) 25 MG tablet Take 1 tablet (25 mg total) by mouth daily. Please keep upcoming appt with Dr. Meda Kramer before anymore refills. Thank you     Allergies:   Atorvastatin, Codeine, Erythromycin, Macrobid [nitrofurantoin], Sulfa antibiotics, Sulfamethoxazole, Sulfonamide derivatives, Tetracyclines & related, and Tramadol    Social History   Socioeconomic History  . Marital status: Divorced    Spouse name: Not on file  . Number of children: 2  . Years of education: Not on file  . Highest education level: Not on file  Occupational History  . Occupation: Architectural technologist: [employer]  Tobacco Use  . Smoking status: Former Smoker    Years: 20.00    Quit date: 12/12/1988    Years since quitting: 32.2  . Smokeless tobacco: Never Used  Vaping Use  . Vaping Use: Never used  Substance and Sexual Activity  . Alcohol use: Yes    Alcohol/week: 7.0 standard drinks    Types: 7 Standard drinks or equivalent per week    Comment: 7 glasses  . Drug use: No  . Sexual activity: Not Currently    Birth control/protection: Post-menopausal    Comment: 1st intercourse 75 yo-Fewer than 5 partners  Other Topics Concern  . Not on file  Social History Narrative  . Not on file   Social Determinants of Health   Financial Resource Strain: Not on file  Food Insecurity: Not on file  Transportation Needs: Not on file  Physical Activity: Not on file  Stress: Not on file  Social Connections: Not on file     Family History: The patient's family history includes Breast cancer in her maternal aunt; Diabetes in her mother; Heart disease in her maternal uncle, mother, and paternal grandfather; Hypertension in her mother; Multiple myeloma in her father.  ROS:   Please see the history of present illness.    Review of Systems  Constitutional: Negative for chills, fever and malaise/fatigue.  HENT: Negative for hearing loss.   Eyes: Negative for blurred vision and redness.  Respiratory: Negative for shortness of breath.   Cardiovascular: Negative for chest pain, palpitations, orthopnea, claudication, leg swelling and PND.  Gastrointestinal: Negative for nausea and vomiting.  Genitourinary: Negative for dysuria and flank pain.  Musculoskeletal: Positive for joint pain. Negative for myalgias.  Neurological: Negative for  dizziness and loss of consciousness.  Endo/Heme/Allergies: Negative for polydipsia.  Psychiatric/Behavioral: Negative for substance abuse.    EKGs/Labs/Other Studies Reviewed:    The following studies were reviewed today: Coronary CT 11/2018 Coronary Arteries: Normal coronary origin. Right dominance.  RCA is a large dominant artery that gives rise to PDA and PLVB. There is mild calcified plaque in the mid RCA with associated stenosis 25-50%.  Left main is a large artery that gives rise to LAD and LCX arteries.  LAD is a large vessel that gives rise to one diagonal artery. There is a moderate focal calcified plaque in the proximal LAD with stenosis 25-50%, but possibly 50-69%. Mid LAD has a mild calcified plaque with stenosis  25-50%.  D1 has no significant plaque.  LCX is a non-dominant artery that gives rise to one OM1 branch. There is no plaque.  Other findings:  Normal pulmonary vein drainage into the left atrium.  Normal let atrial appendage without a thrombus.  Normal size of the pulmonary artery.  IMPRESSION: 1. Coronary calcium score of 255. This was 28 percentile for age and sex matched control.  2. Normal coronary origin with right dominance.  3. Mild CAD in the proximal and mid LAD and mid RCA. Additional analysis with CT FFR are recommended.  1. Left Main: No significant stenosis.  2. LAD: No significant stenosis. 3. LCX: No significant stenosis. 4. RCA: No significant stenosis.  IMPRESSION:  CT FFR analysis didn't show any significant stenosis.   Myoview Study Highlights 04/2016   Nuclear stress EF: 70%.  The study is normal.  This is a low risk study.  The left ventricular ejection fraction is hyperdynamic (>65%).  There was no ST segment deviation noted during stress.  Normal exercise nuclear stress test with no evidence of prior infarct or ischemia. Normal BP response to stress. Excellent exercise capacity.     Echo Study Conclusions 2014  - Left ventricle: The cavity size was normal. Wall thickness  was normal. Systolic function was normal. The estimated  ejection fraction was in the range of 55% to 60%. Wall  motion was normal; there were no regional wall motion  abnormalities. Doppler parameters are consistent with  abnormal left ventricular relaxation (grade 1 diastolic  dysfunction).  - Mitral valve: Valve area by pressure half-time: 1.73cm^2.  - Atrial septum: No defect or patent foramen ovale was  identified.  Impressions:   - No significant abnormalities for age.    EKG:  EKG is  ordered today.  The ekg ordered today demonstrates sinus bradycardia HR 55  Recent Labs: 05/25/2020: ALT 32  Recent Lipid Panel    Component Value Date/Time   CHOL 168 05/25/2020 0837   CHOL 200 (H) 09/28/2016 0900   CHOL 158 08/18/2015 0946   TRIG 84 05/25/2020 0837   TRIG 149 09/28/2016 0900   TRIG 110 08/18/2015 0946   HDL 80 05/25/2020 0837   HDL 78 09/28/2016 0900   HDL 74 08/18/2015 0946   CHOLHDL 2.1 05/25/2020 0837   CHOLHDL 2.6 09/28/2016 0900   CHOLHDL 3 06/18/2014 0852   VLDL 20.4 06/18/2014 0852   LDLCALC 73 05/25/2020 0837   LDLCALC 97 09/28/2016 0900   LDLCALC 62 08/18/2015 0946    Physical Exam:    VS:  BP 110/64   Pulse (!) 55   Ht 5' 3.75" (1.619 m)   Wt 112 lb 12.8 oz (51.2 kg)   SpO2 97%   BMI 19.51 kg/m     Wt Readings from Last 3 Encounters:  02/18/21 112 lb 12.8 oz (51.2 kg)  09/25/20 112 lb (50.8 kg)  09/08/20 112 lb (50.8 kg)     GEN:  Well nourished, well developed in no acute distress HEENT: Normal NECK: No JVD; No carotid bruits LYMPHATICS: No lymphadenopathy CARDIAC: Bradycardic, regular, no murmurs, rubs, gallops RESPIRATORY:  Clear to auscultation without rales, wheezing or rhonchi  ABDOMEN: Soft, non-tender, non-distended MUSCULOSKELETAL:  No edema; No deformity  SKIN: Warm and dry NEUROLOGIC:  Alert and oriented x  3 PSYCHIATRIC:  Normal affect   ASSESSMENT:    1. Coronary artery disease involving native coronary artery of native heart without angina pectoris   2. Essential hypertension   3. Mixed  hyperlipidemia    PLAN:    In order of problems listed above:  #Non-obstructive CAD: Coronary CTA 2019 with mild non obstructive CAD in the proximal and mid LAD and mid RCA. FFR was normal. Patient is very active and exercises without issues.  -Continue crestor 48m daily -Continue lifestyle modifications  #HTN: Controlled.  -Continue losartan 296mdaily  #HLD: -Continue crestor 1021maily -Repeat lipids in 2 months -Continue lifestyle modificaitons    Medication Adjustments/Labs and Tests Ordered: Current medicines are reviewed at length with the patient today.  Concerns regarding medicines are outlined above.  Orders Placed This Encounter  Procedures  . Lipid panel  . EKG 12-Lead   Meds ordered this encounter  Medications  . losartan (COZAAR) 25 MG tablet    Sig: Take 1 tablet (25 mg total) by mouth daily. Please keep upcoming appt with Dr. NelMeda Coffeefore anymore refills. Thank you    Dispense:  90 tablet    Refill:  3    Pt must keep upcoming appointment with Dr. NelMeda Coffeefore anymore refills. Thank you    Patient Instructions  Medication Instructions:  Your physician recommends that you continue on your current medications as directed. Please refer to the Current Medication list given to you today. *If you need a refill on your cardiac medications before your next appointment, please call your pharmacy*   Lab Work: Your physician recommends that you return for lab work in: 2 months Fasting Lipids panel;  If you have labs (blood work) drawn today and your tests are completely normal, you will receive your results only by: . MMarland KitchenChart Message (if you have MyChart) OR . A paper copy in the mail If you have any lab test that is abnormal or we need to change your treatment, we will  call you to review the results.    Follow-Up: At [facility] and your health needs are our priority.  As part of our continuing mission to provide you with exceptional heart care, we have created designated Provider Care Teams.  These Care Teams include your primary Cardiologist (physician) and Advanced Practice Providers (APPs -  Physician Assistants and Nurse Practitioners) who all work together to provide you with the care you need, when you need it.  We recommend signing up for the patient portal called "MyChart".  Sign up information is provided on this After Visit Summary.  MyChart is used to connect with patients for Virtual Visits (Telemedicine).  Patients are able to view lab/test results, encounter notes, upcoming appointments, etc.  Non-urgent messages can be sent to your provider as well.   To learn more about what you can do with MyChart, go to [email or web address]  Your next appointment:   12 month(s)  The format for your next appointment:   In Person  Provider:   You may see Dr. HeaGwyndolyn Kaufman one of the following Advanced Practice Providers on your designated Care Team:    ScoRichardson DoppA-C  VinRobbie LisA-[place]     Signed, HeaFreada BergeronD  02/18/2021 12:59 PM    [place]

## 2021-02-18 ENCOUNTER — Encounter: Payer: Self-pay | Admitting: Cardiology

## 2021-02-18 ENCOUNTER — Other Ambulatory Visit: Payer: Self-pay

## 2021-02-18 ENCOUNTER — Ambulatory Visit (INDEPENDENT_AMBULATORY_CARE_PROVIDER_SITE_OTHER): Payer: Medicare Other | Admitting: Cardiology

## 2021-02-18 VITALS — BP 110/64 | HR 55 | Ht 63.75 in | Wt 112.8 lb

## 2021-02-18 DIAGNOSIS — E782 Mixed hyperlipidemia: Secondary | ICD-10-CM

## 2021-02-18 DIAGNOSIS — I2510 Atherosclerotic heart disease of native coronary artery without angina pectoris: Secondary | ICD-10-CM | POA: Diagnosis not present

## 2021-02-18 DIAGNOSIS — I10 Essential (primary) hypertension: Secondary | ICD-10-CM | POA: Diagnosis not present

## 2021-02-18 MED ORDER — LOSARTAN POTASSIUM 25 MG PO TABS: 25.0000 mg | ORAL_TABLET | Freq: Every day | ORAL | 3 refills | Status: DC

## 2021-02-18 NOTE — Patient Instructions (Signed)
Medication Instructions:  Your physician recommends that you continue on your current medications as directed. Please refer to the Current Medication list given to you today. *If you need a refill on your cardiac medications before your next appointment, please call your pharmacy*   Lab Work: Your physician recommends that you return for lab work in: 2 months Fasting Lipids panel;  If you have labs (blood work) drawn today and your tests are completely normal, you will receive your results only by: Marland Kitchen MyChart Message (if you have MyChart) OR . A paper copy in the mail If you have any lab test that is abnormal or we need to change your treatment, we will call you to review the results.    Follow-Up: At [facility], you and your health needs are our priority.  As part of our continuing mission to provide you with exceptional heart care, we have created designated Provider Care Teams.  These Care Teams include your primary Cardiologist (physician) and Advanced Practice Providers (APPs -  Physician Assistants and Nurse Practitioners) who all work together to provide you with the care you need, when you need it.  We recommend signing up for the patient portal called "MyChart".  Sign up information is provided on this After Visit Summary.  MyChart is used to connect with patients for Virtual Visits (Telemedicine).  Patients are able to view lab/test results, encounter notes, upcoming appointments, etc.  Non-urgent messages can be sent to your provider as well.   To learn more about what you can do with MyChart, go to [email or web address].    Your next appointment:   12 month(s)  The format for your next appointment:   In Person  Provider:   You may see Dr. Gwyndolyn Kaufman or one of the following Advanced Practice Providers on your designated Care Team:    Richardson Dopp, PA-C  [place], [place]

## 2021-02-25 DIAGNOSIS — J3089 Other allergic rhinitis: Secondary | ICD-10-CM | POA: Diagnosis not present

## 2021-02-25 DIAGNOSIS — J301 Allergic rhinitis due to pollen: Secondary | ICD-10-CM | POA: Diagnosis not present

## 2021-03-10 DIAGNOSIS — G4700 Insomnia, unspecified: Secondary | ICD-10-CM | POA: Diagnosis not present

## 2021-03-10 DIAGNOSIS — I10 Essential (primary) hypertension: Secondary | ICD-10-CM | POA: Diagnosis not present

## 2021-03-10 DIAGNOSIS — G8929 Other chronic pain: Secondary | ICD-10-CM | POA: Diagnosis not present

## 2021-03-10 DIAGNOSIS — K219 Gastro-esophageal reflux disease without esophagitis: Secondary | ICD-10-CM | POA: Diagnosis not present

## 2021-03-10 DIAGNOSIS — E7800 Pure hypercholesterolemia, unspecified: Secondary | ICD-10-CM | POA: Diagnosis not present

## 2021-03-10 DIAGNOSIS — M810 Age-related osteoporosis without current pathological fracture: Secondary | ICD-10-CM | POA: Diagnosis not present

## 2021-03-15 DIAGNOSIS — H1045 Other chronic allergic conjunctivitis: Secondary | ICD-10-CM | POA: Diagnosis not present

## 2021-03-15 DIAGNOSIS — J3089 Other allergic rhinitis: Secondary | ICD-10-CM | POA: Diagnosis not present

## 2021-03-15 DIAGNOSIS — J019 Acute sinusitis, unspecified: Secondary | ICD-10-CM | POA: Diagnosis not present

## 2021-03-15 DIAGNOSIS — K219 Gastro-esophageal reflux disease without esophagitis: Secondary | ICD-10-CM | POA: Diagnosis not present

## 2021-03-15 DIAGNOSIS — J4530 Mild persistent asthma, uncomplicated: Secondary | ICD-10-CM | POA: Diagnosis not present

## 2021-03-29 DIAGNOSIS — M542 Cervicalgia: Secondary | ICD-10-CM | POA: Diagnosis not present

## 2021-03-29 DIAGNOSIS — M25511 Pain in right shoulder: Secondary | ICD-10-CM | POA: Diagnosis not present

## 2021-03-29 DIAGNOSIS — M545 Low back pain, unspecified: Secondary | ICD-10-CM | POA: Diagnosis not present

## 2021-04-02 DIAGNOSIS — J301 Allergic rhinitis due to pollen: Secondary | ICD-10-CM | POA: Diagnosis not present

## 2021-04-02 DIAGNOSIS — J3089 Other allergic rhinitis: Secondary | ICD-10-CM | POA: Diagnosis not present

## 2021-04-15 DIAGNOSIS — G8929 Other chronic pain: Secondary | ICD-10-CM | POA: Diagnosis not present

## 2021-04-15 DIAGNOSIS — E7800 Pure hypercholesterolemia, unspecified: Secondary | ICD-10-CM | POA: Diagnosis not present

## 2021-04-15 DIAGNOSIS — I10 Essential (primary) hypertension: Secondary | ICD-10-CM | POA: Diagnosis not present

## 2021-04-15 DIAGNOSIS — M810 Age-related osteoporosis without current pathological fracture: Secondary | ICD-10-CM | POA: Diagnosis not present

## 2021-04-15 DIAGNOSIS — K219 Gastro-esophageal reflux disease without esophagitis: Secondary | ICD-10-CM | POA: Diagnosis not present

## 2021-04-15 DIAGNOSIS — G4700 Insomnia, unspecified: Secondary | ICD-10-CM | POA: Diagnosis not present

## 2021-04-19 DIAGNOSIS — M25572 Pain in left ankle and joints of left foot: Secondary | ICD-10-CM | POA: Diagnosis not present

## 2021-04-20 ENCOUNTER — Other Ambulatory Visit: Payer: Medicare Other | Admitting: *Deleted

## 2021-04-20 ENCOUNTER — Other Ambulatory Visit: Payer: Self-pay

## 2021-04-20 DIAGNOSIS — E782 Mixed hyperlipidemia: Secondary | ICD-10-CM | POA: Diagnosis not present

## 2021-04-20 LAB — LIPID PANEL
Chol/HDL Ratio: 2.5 ratio (ref 0.0–4.4)
Cholesterol, Total: 191 mg/dL (ref 100–199)
HDL: 75 mg/dL (ref >39)
LDL Chol Calc (NIH): 98 mg/dL (ref 0–99)
Triglycerides: 103 mg/dL (ref 0–149)
VLDL Cholesterol Cal: 18 mg/dL (ref 5–40)

## 2021-04-21 ENCOUNTER — Telehealth: Payer: Self-pay | Admitting: *Deleted

## 2021-04-21 DIAGNOSIS — E782 Mixed hyperlipidemia: Secondary | ICD-10-CM

## 2021-04-21 DIAGNOSIS — I2510 Atherosclerotic heart disease of native coronary artery without angina pectoris: Secondary | ICD-10-CM

## 2021-04-21 DIAGNOSIS — I25119 Atherosclerotic heart disease of native coronary artery with unspecified angina pectoris: Secondary | ICD-10-CM

## 2021-04-21 DIAGNOSIS — Z79899 Other long term (current) drug therapy: Secondary | ICD-10-CM

## 2021-04-21 MED ORDER — ROSUVASTATIN CALCIUM 20 MG PO TABS: 20.0000 mg | ORAL_TABLET | Freq: Every day | ORAL | 1 refills | Status: DC

## 2021-04-21 NOTE — Telephone Encounter (Signed)
-----   Message from Freada Bergeron, MD sent at 04/20/2021  8:00 PM EDT ----- Her cholesterol level is mildly above goal with LDL 98 (goal is LDL<70) given her calcium on CT chest. If she is willing, I think we should increase her crestor to 20mg  daily and repeat labs in 6 weeks.

## 2021-04-21 NOTE — Telephone Encounter (Signed)
Pt aware of lab results and recommendations per Dr. Johney Frame.  Pt is aware that Dr. Johney Frame wants her to increase her Rosuvastatin to 20 mg po daily, and repeat lipids and LFTs in 6 weeks.  Confirmed the pharmacy of choice with the pt.  Scheduled the pt for repeat labs on 06/09/21, for she requested this date due to being out of town the week before.  Lab made for 6/29.  Pt is aware to come fasting to this lab appointment.  Pt verbalized understanding and agrees with this plan.

## 2021-04-22 DIAGNOSIS — U071 COVID-19: Secondary | ICD-10-CM | POA: Diagnosis not present

## 2021-05-05 DIAGNOSIS — J3089 Other allergic rhinitis: Secondary | ICD-10-CM | POA: Diagnosis not present

## 2021-05-11 DIAGNOSIS — J301 Allergic rhinitis due to pollen: Secondary | ICD-10-CM | POA: Diagnosis not present

## 2021-05-11 DIAGNOSIS — J3089 Other allergic rhinitis: Secondary | ICD-10-CM | POA: Diagnosis not present

## 2021-05-19 ENCOUNTER — Telehealth: Payer: Self-pay | Admitting: Cardiology

## 2021-05-19 DIAGNOSIS — Z79899 Other long term (current) drug therapy: Secondary | ICD-10-CM

## 2021-05-19 DIAGNOSIS — I25119 Atherosclerotic heart disease of native coronary artery with unspecified angina pectoris: Secondary | ICD-10-CM

## 2021-05-19 DIAGNOSIS — E782 Mixed hyperlipidemia: Secondary | ICD-10-CM

## 2021-05-19 DIAGNOSIS — I2510 Atherosclerotic heart disease of native coronary artery without angina pectoris: Secondary | ICD-10-CM

## 2021-05-19 MED ORDER — ROSUVASTATIN CALCIUM 20 MG PO TABS: 20.0000 mg | ORAL_TABLET | Freq: Every day | ORAL | 3 refills | Status: DC

## 2021-05-19 NOTE — Telephone Encounter (Signed)
Pt's medication was sent to pt's pharmacy as requested. Confirmation received.  °

## 2021-05-19 NOTE — Telephone Encounter (Signed)
*  STAT* If patient is at the pharmacy, call can be transferred to refill team.   1. Which medications need to be refilled? (please list name of each medication and dose if known)  rosuvastatin (CRESTOR) 20 MG tablet  2. Which pharmacy/location (including street and city if local pharmacy) is medication to be sent to? [facility] [address] [place]  3. Do they need a 30 day or 90 day supply? 90   Patient was on 10 mg but the dosage was increased at her last appointment. She will need a new rx of 20mg  sent to her pharmacy

## 2021-05-30 DIAGNOSIS — Z20822 Contact with and (suspected) exposure to covid-19: Secondary | ICD-10-CM | POA: Diagnosis not present

## 2021-05-31 DIAGNOSIS — J301 Allergic rhinitis due to pollen: Secondary | ICD-10-CM | POA: Diagnosis not present

## 2021-05-31 DIAGNOSIS — J3089 Other allergic rhinitis: Secondary | ICD-10-CM | POA: Diagnosis not present

## 2021-05-31 DIAGNOSIS — Z20828 Contact with and (suspected) exposure to other viral communicable diseases: Secondary | ICD-10-CM | POA: Diagnosis not present

## 2021-06-09 ENCOUNTER — Other Ambulatory Visit: Payer: Medicare Other | Admitting: *Deleted

## 2021-06-09 ENCOUNTER — Other Ambulatory Visit: Payer: Self-pay

## 2021-06-09 DIAGNOSIS — I25119 Atherosclerotic heart disease of native coronary artery with unspecified angina pectoris: Secondary | ICD-10-CM | POA: Diagnosis not present

## 2021-06-09 DIAGNOSIS — I2510 Atherosclerotic heart disease of native coronary artery without angina pectoris: Secondary | ICD-10-CM | POA: Diagnosis not present

## 2021-06-09 DIAGNOSIS — E782 Mixed hyperlipidemia: Secondary | ICD-10-CM | POA: Diagnosis not present

## 2021-06-09 DIAGNOSIS — Z79899 Other long term (current) drug therapy: Secondary | ICD-10-CM

## 2021-06-09 LAB — LIPID PANEL
Chol/HDL Ratio: 2.4 ratio (ref 0.0–4.4)
Cholesterol, Total: 160 mg/dL (ref 100–199)
HDL: 68 mg/dL (ref >39)
LDL Chol Calc (NIH): 65 mg/dL (ref 0–99)
Triglycerides: 160 mg/dL — ABNORMAL HIGH (ref 0–149)
VLDL Cholesterol Cal: 27 mg/dL (ref 5–40)

## 2021-06-09 LAB — HEPATIC FUNCTION PANEL
ALT: 15 IU/L (ref 0–32)
AST: 19 IU/L (ref 0–40)
Albumin: 4.5 g/dL (ref 3.7–4.7)
Alkaline Phosphatase: 50 IU/L (ref 44–121)
Bilirubin Total: 0.6 mg/dL (ref 0.0–1.2)
Bilirubin, Direct: 0.16 mg/dL (ref 0.00–0.40)
Total Protein: 7.2 g/dL (ref 6.0–8.5)

## 2021-06-13 DIAGNOSIS — M25571 Pain in right ankle and joints of right foot: Secondary | ICD-10-CM | POA: Diagnosis not present

## 2021-06-28 DIAGNOSIS — M25571 Pain in right ankle and joints of right foot: Secondary | ICD-10-CM | POA: Diagnosis not present

## 2021-06-29 ENCOUNTER — Ambulatory Visit (INDEPENDENT_AMBULATORY_CARE_PROVIDER_SITE_OTHER): Payer: Medicare Other | Admitting: Internal Medicine

## 2021-06-29 ENCOUNTER — Encounter: Payer: Self-pay | Admitting: Internal Medicine

## 2021-06-29 VITALS — BP 112/70 | HR 62 | Ht 63.75 in | Wt 114.4 lb

## 2021-06-29 DIAGNOSIS — R6889 Other general symptoms and signs: Secondary | ICD-10-CM

## 2021-06-29 DIAGNOSIS — K219 Gastro-esophageal reflux disease without esophagitis: Secondary | ICD-10-CM | POA: Diagnosis not present

## 2021-06-29 DIAGNOSIS — K5730 Diverticulosis of large intestine without perforation or abscess without bleeding: Secondary | ICD-10-CM

## 2021-06-29 DIAGNOSIS — K581 Irritable bowel syndrome with constipation: Secondary | ICD-10-CM | POA: Diagnosis not present

## 2021-06-29 DIAGNOSIS — R0989 Other specified symptoms and signs involving the circulatory and respiratory systems: Secondary | ICD-10-CM

## 2021-06-29 MED ORDER — PANTOPRAZOLE SODIUM 40 MG PO TBEC: 40.0000 mg | DELAYED_RELEASE_TABLET | Freq: Two times a day (BID) | ORAL | 3 refills | Status: DC

## 2021-06-29 NOTE — Patient Instructions (Signed)
If you are age 74 or older, your body mass index should be between 23-30. Your Body mass index is 19.79 kg/m. If this is out of the aforementioned range listed, please consider follow up with your Primary Care Provider.  If you are age 79 or younger, your body mass index should be between 19-25. Your Body mass index is 19.79 kg/m. If this is out of the aformentioned range listed, please consider follow up with your Primary Care Provider.   __________________________________________________________  The [facility] providers would like to encourage you to use [facility] to communicate with providers for non-urgent requests or questions.  Due to long hold times on the telephone, sending your provider a message by [facility] may be a faster and more efficient way to get a response.  Please allow 48 business hours for a response.  Please remember that this is for non-urgent requests.   We have sent the following medications to your pharmacy for you to pick up at your convenience:  Pantoprazole  Please follow up in three months

## 2021-06-30 ENCOUNTER — Encounter: Payer: Self-pay | Admitting: Internal Medicine

## 2021-06-30 NOTE — Progress Notes (Signed)
HISTORY OF PRESENT ILLNESS:  Madison Kramer is a 75 y.o. female with irritable bowel syndrome and health related anxiety who presents today for evaluation of chronic throat clearing behavior.  Patient was last seen in this office December 27, 2019.  See that dictation for details.  The chief complaint today is that of the need to clear pharyngeal mucus approximately 1 hour after eating breakfast.  There is chronic throat clearing behavior.  This continues off and on for an hour or so.  Tends to be better later in the day.  She had been on PPI in the past for atypical pharyngeal symptoms without clear effect.  In December 2013 upper endoscopy to evaluate pharyngeal complaints was normal.  She does have allergies and has no allergies.  She is no longer on PPI.  She denies classic reflux symptoms.  She tells me that she has taken a tablespoon of apple cider vinegar daily for the past week.  She thinks that this may be helping some, but is uncertain.  Irritable bowel complaints with bloating and a tendency toward constipation are unchanged.  She denies shortness of breath, nocturnal symptoms, or wheezing.  Her last colonoscopy was performed in 2018.  This revealed diverticulosis only.  She tells me that she had COVID but recovered.  REVIEW OF SYSTEMS:  All non-GI ROS negative unless otherwise stated in the HPI except for arthritis, back pain, excessive urination.  Past Medical History:  Diagnosis Date   Allergic rhinitis    Anxiety    Arthritis    Asthma    Atypical chest pain    Possibly from DES   Back pain    Cataract    Hx; of   Diverticulosis    Left   Elevated cholesterol    GERD (gastroesophageal reflux disease)    Hemoptysis 2007   From reflux pharyngitis   History of nuclear stress test    Myoview 5/17: EF 70%, no scar or ischemia, excellent exercise capacity, low risk   Hypertension    Insomnia    Lactose intolerance    Lichen sclerosus et atrophicus of the vulva 2008    Biopsy-proven   Nocturia    Osteoporosis 10/2015   T score -2.5 left femoral neck   Pneumonia    Raynaud's syndrome    "possible"    Past Surgical History:  Procedure Laterality Date   BREAST SURGERY  2001   REDUCTION MAMMAPLASTY   COLONOSCOPY     FOOT SURGERY  2009   HYSTEROSCOPY  2003   HYSTEROSCOPIC RESECTION OF MYOMA   INGUINAL HERNIA REPAIR  1971   BILATERAL   LUMBAR LAMINECTOMY/DECOMPRESSION MICRODISCECTOMY Right 08/30/2013   Procedure: Right Lumbar four-five Extraforaminal Diskectomy;  Surgeon: Hosie Spangle, MD;  Location: [facility];  Service: Neurosurgery;  Laterality: Right;   MANDIBLE SURGERY     MYOMECTOMY     TONSILLECTOMY     TUBAL LIGATION  1987    Social History MING KUNKA  reports that she quit smoking about 32 years ago. She has never used smokeless tobacco. She reports current alcohol use of about 7.0 standard drinks of alcohol per week. She reports that she does not use drugs.  family history includes Breast cancer in her maternal aunt; Diabetes in her mother; Heart disease in her maternal uncle, mother, and paternal grandfather; Hypertension in her mother; Multiple myeloma in her father.  Allergies  Allergen Reactions   Atorvastatin Other (See Comments)    Pt reports causes  lower extremity muscle cramps   Codeine Nausea Only   Erythromycin Other (See Comments)    Stomach cramping   Macrobid [Nitrofurantoin] Nausea Only    headache   Sulfa Antibiotics     As a child   Sulfamethoxazole Other (See Comments)    Unsdure of reaction. Had as a child.   Sulfonamide Derivatives    Tetracyclines & Related     unknown   Tramadol Nausea Only and Other (See Comments)    Dizziness and mind racing       PHYSICAL EXAMINATION: Vital signs: BP 112/70 (BP Location: Left Arm, Patient Position: Sitting, Cuff Size: Normal)   Pulse 62   Ht 5' 3.75" (1.619 m)   Wt 114 lb 6 oz (51.9 kg)   SpO2 98%   BMI 19.79 kg/m   Constitutional: generally  well-appearing, no acute distress Psychiatric: alert and oriented x3, cooperative Eyes: extraocular movements intact, anicteric, conjunctiva pink Mouth: oral pharynx moist, no lesions Neck: supple no lymphadenopathy Cardiovascular: heart regular rate and rhythm, no murmur Lungs: clear to auscultation bilaterally Abdomen: soft, nontender, nondistended, no obvious ascites, no peritoneal signs, normal bowel sounds, no organomegaly Rectal: Omitted Extremities: no clubbing, cyanosis, or lower extremity edema bilaterally.  Right ankle brace Skin: no lesions on visible extremities Neuro: No focal deficits.  Cranial nerves intact  ASSESSMENT:  1.  Chronic throat clearing behavior.  Query GERD.  Negative EGD December 2013 2.  Constipation predominant IBS 3.  Diverticulosis 4.  Negative for neoplasia colonoscopy 2018.  Aged out of surveillance 5.  Health related anxiety   PLAN:  1.  Patient will try her apple cider vinegar for 1 month.  If this helps symptoms, then continue. 2.  Prescribe pantoprazole 40 mg twice daily.  If apple cider vinegar ineffective, then she will initiate this regimen for at least 2 months.  Medication risks reviewed 3.  GI follow-up 3 months.  Contact the office in the interim for any questions or problems. A total time of 30 minutes was spent preparing to see the patient, reviewing test, obtaining comprehensive history, performing medically appropriate physical examination, counseling patient regarding her above listed issues, ordering medications, arranging follow-up, and documenting clinical information in the health record

## 2021-07-05 DIAGNOSIS — M25371 Other instability, right ankle: Secondary | ICD-10-CM | POA: Diagnosis not present

## 2021-07-07 DIAGNOSIS — J3089 Other allergic rhinitis: Secondary | ICD-10-CM | POA: Diagnosis not present

## 2021-07-12 DIAGNOSIS — M25371 Other instability, right ankle: Secondary | ICD-10-CM | POA: Diagnosis not present

## 2021-07-29 ENCOUNTER — Other Ambulatory Visit: Payer: Self-pay

## 2021-07-29 ENCOUNTER — Telehealth: Payer: Self-pay | Admitting: Internal Medicine

## 2021-07-29 MED ORDER — OMEPRAZOLE 40 MG PO CPDR: 40.0000 mg | DELAYED_RELEASE_CAPSULE | Freq: Two times a day (BID) | ORAL | 3 refills | Status: DC

## 2021-07-29 NOTE — Telephone Encounter (Signed)
Dr. Henrene Pastor Pt   Pt states that she has been having side effects such as nausea, headache and  dizziness from the Pantoprazole so she stopped taking it today. Pt stated that she started back taking this medication a couple days ago. Pt states she is taking medication twice a day.  She would like another alternative if possible. Please Advise as DOD

## 2021-07-29 NOTE — Telephone Encounter (Signed)
Pt states that she has been having side effects from Pantoprazole so she stopped taking it today. She would like another alternative if possible.

## 2021-07-29 NOTE — Telephone Encounter (Signed)
Reviewed Dr. Blanch Media last clinic note. May substitute with any PPI such as ompeprazole 40 mg twice daily as preferred by her insurance formulary so that it's not cost prohibitive.

## 2021-07-29 NOTE — Telephone Encounter (Signed)
Prescription was sent to pharmacy; Pt was notified of the change in the Medication.

## 2021-08-03 DIAGNOSIS — E7800 Pure hypercholesterolemia, unspecified: Secondary | ICD-10-CM | POA: Diagnosis not present

## 2021-08-03 DIAGNOSIS — G8929 Other chronic pain: Secondary | ICD-10-CM | POA: Diagnosis not present

## 2021-08-03 DIAGNOSIS — I10 Essential (primary) hypertension: Secondary | ICD-10-CM | POA: Diagnosis not present

## 2021-08-03 DIAGNOSIS — G4700 Insomnia, unspecified: Secondary | ICD-10-CM | POA: Diagnosis not present

## 2021-08-03 DIAGNOSIS — M810 Age-related osteoporosis without current pathological fracture: Secondary | ICD-10-CM | POA: Diagnosis not present

## 2021-08-03 DIAGNOSIS — K219 Gastro-esophageal reflux disease without esophagitis: Secondary | ICD-10-CM | POA: Diagnosis not present

## 2021-08-04 DIAGNOSIS — J3089 Other allergic rhinitis: Secondary | ICD-10-CM | POA: Diagnosis not present

## 2021-08-05 DIAGNOSIS — J3089 Other allergic rhinitis: Secondary | ICD-10-CM | POA: Diagnosis not present

## 2021-08-09 DIAGNOSIS — J3089 Other allergic rhinitis: Secondary | ICD-10-CM | POA: Diagnosis not present

## 2021-08-13 DIAGNOSIS — M542 Cervicalgia: Secondary | ICD-10-CM | POA: Diagnosis not present

## 2021-08-13 DIAGNOSIS — J301 Allergic rhinitis due to pollen: Secondary | ICD-10-CM | POA: Diagnosis not present

## 2021-08-13 DIAGNOSIS — M25511 Pain in right shoulder: Secondary | ICD-10-CM | POA: Diagnosis not present

## 2021-08-13 DIAGNOSIS — M25571 Pain in right ankle and joints of right foot: Secondary | ICD-10-CM | POA: Diagnosis not present

## 2021-08-13 DIAGNOSIS — M545 Low back pain, unspecified: Secondary | ICD-10-CM | POA: Diagnosis not present

## 2021-08-13 DIAGNOSIS — J3089 Other allergic rhinitis: Secondary | ICD-10-CM | POA: Diagnosis not present

## 2021-08-13 DIAGNOSIS — J3081 Allergic rhinitis due to animal (cat) (dog) hair and dander: Secondary | ICD-10-CM | POA: Diagnosis not present

## 2021-08-18 DIAGNOSIS — J3089 Other allergic rhinitis: Secondary | ICD-10-CM | POA: Diagnosis not present

## 2021-08-20 DIAGNOSIS — J3089 Other allergic rhinitis: Secondary | ICD-10-CM | POA: Diagnosis not present

## 2021-09-10 DIAGNOSIS — Z23 Encounter for immunization: Secondary | ICD-10-CM | POA: Diagnosis not present

## 2021-09-13 DIAGNOSIS — Z23 Encounter for immunization: Secondary | ICD-10-CM | POA: Diagnosis not present

## 2021-09-14 DIAGNOSIS — M25511 Pain in right shoulder: Secondary | ICD-10-CM | POA: Diagnosis not present

## 2021-09-14 DIAGNOSIS — M545 Low back pain, unspecified: Secondary | ICD-10-CM | POA: Diagnosis not present

## 2021-09-14 DIAGNOSIS — M542 Cervicalgia: Secondary | ICD-10-CM | POA: Diagnosis not present

## 2021-09-14 DIAGNOSIS — M25571 Pain in right ankle and joints of right foot: Secondary | ICD-10-CM | POA: Diagnosis not present

## 2021-09-15 ENCOUNTER — Ambulatory Visit: Payer: Medicare Other | Admitting: Internal Medicine

## 2021-09-16 DIAGNOSIS — J3089 Other allergic rhinitis: Secondary | ICD-10-CM | POA: Diagnosis not present

## 2021-09-23 ENCOUNTER — Telehealth (HOSPITAL_BASED_OUTPATIENT_CLINIC_OR_DEPARTMENT_OTHER): Payer: Self-pay | Admitting: Obstetrics & Gynecology

## 2021-09-23 DIAGNOSIS — M542 Cervicalgia: Secondary | ICD-10-CM | POA: Diagnosis not present

## 2021-09-23 DIAGNOSIS — M25571 Pain in right ankle and joints of right foot: Secondary | ICD-10-CM | POA: Diagnosis not present

## 2021-09-23 DIAGNOSIS — M545 Low back pain, unspecified: Secondary | ICD-10-CM | POA: Diagnosis not present

## 2021-09-23 DIAGNOSIS — M25511 Pain in right shoulder: Secondary | ICD-10-CM | POA: Diagnosis not present

## 2021-09-23 NOTE — Telephone Encounter (Signed)
Patient call and left message for Dr Sabra Heck to call her and that she had some irritation and may need some cream for it .Called patient back left message  that she hasn't been seen and would need a appointment .I did offer her a appointment for office visit for tomorrow .

## 2021-09-27 DIAGNOSIS — Z1231 Encounter for screening mammogram for malignant neoplasm of breast: Secondary | ICD-10-CM | POA: Diagnosis not present

## 2021-09-28 NOTE — Telephone Encounter (Signed)
LMOM following up on previous phone call to offer pt an appt.

## 2021-10-01 ENCOUNTER — Encounter: Payer: Self-pay | Admitting: Obstetrics & Gynecology

## 2021-10-06 ENCOUNTER — Other Ambulatory Visit: Payer: Self-pay

## 2021-10-06 ENCOUNTER — Encounter (HOSPITAL_BASED_OUTPATIENT_CLINIC_OR_DEPARTMENT_OTHER): Payer: Self-pay

## 2021-10-06 ENCOUNTER — Telehealth (HOSPITAL_BASED_OUTPATIENT_CLINIC_OR_DEPARTMENT_OTHER): Payer: Medicare Other | Admitting: Obstetrics & Gynecology

## 2021-10-07 ENCOUNTER — Telehealth (INDEPENDENT_AMBULATORY_CARE_PROVIDER_SITE_OTHER): Payer: Medicare Other | Admitting: Obstetrics & Gynecology

## 2021-10-07 ENCOUNTER — Other Ambulatory Visit: Payer: Self-pay

## 2021-10-07 DIAGNOSIS — N9089 Other specified noninflammatory disorders of vulva and perineum: Secondary | ICD-10-CM | POA: Diagnosis not present

## 2021-10-07 DIAGNOSIS — R922 Inconclusive mammogram: Secondary | ICD-10-CM

## 2021-10-07 DIAGNOSIS — M810 Age-related osteoporosis without current pathological fracture: Secondary | ICD-10-CM | POA: Diagnosis not present

## 2021-10-07 DIAGNOSIS — I2510 Atherosclerotic heart disease of native coronary artery without angina pectoris: Secondary | ICD-10-CM | POA: Diagnosis not present

## 2021-10-07 DIAGNOSIS — J3089 Other allergic rhinitis: Secondary | ICD-10-CM | POA: Diagnosis not present

## 2021-10-07 DIAGNOSIS — H524 Presbyopia: Secondary | ICD-10-CM | POA: Diagnosis not present

## 2021-10-07 DIAGNOSIS — Z961 Presence of intraocular lens: Secondary | ICD-10-CM | POA: Diagnosis not present

## 2021-10-07 NOTE — Progress Notes (Addendum)
Virtual Visit via Video Note  Patient and I both attempted multiple times to connect via virtual video visit.  Pt could not seem to get onto the connection.  I called pt and we decided to switch to phone visit.  I then connected with Madison Kramer on 10/07/21 at  1:45 PM EDT by telephone and verified that I am speaking with the correct person using two identifiers.  Location: Patient: home Provider: office   I discussed the limitations, risks, security and privacy concerns of performing an evaluation and management service by telephone and the availability of in person appointments. I also discussed with the patient that there may be a patient responsible charge related to this service. The patient expressed understanding and agreed to proceed.   History of Present Illness: 75 yo G2P2 who wanted to discuss her mammogram and questions about increased screening.  Pt could not log onto mychart so phone consultation performed.  Has heard about Katie Couric's breast cancer diagnosis.  Concerned about her own breast density and possible risks.  Reviewed with pt her personally density is Grade B so 3D MMG screening should be enough.  I did perform a Warehouse manager model assessment with her today.  Her 10 year risk for breast cancer development is 3.6% vs 3.1% in average risk woman.  No additional screening recommended.  However limited breast MRI could be done if she wanted to pay out of pock.  Screening ultrasound is not recommended.  She also reports she is having some vulvar/vaginal irritation and occasional odor.  Testing may be appropriate but douche with baking soda and water recommended.  Also, she doesn't really want to be on any additional hormonal therapy.  Has used coconut oil.  Aquaphor recommended with daily use.  Questions answered.  Lastly, pt with hx of osteoporosis.  Repeat BMD due.  Order will be placed.   Observations/Objective: Could not see pt  Assessment and Plan: 1. Breast  density - 3D screening MMG still recommended  2. Vulvar irritation - options discussed.  Aquaphor discussed.  3. Age-related osteoporosis without current pathological fracture - DG BONE DENSITY (DXA); Future   Follow Up Instructions: I discussed the assessment and treatment plan with the patient. The patient was provided an opportunity to ask questions and all were answered. The patient agreed with the plan and demonstrated an understanding of the instructions.   The patient was advised to call back or seek an in-person evaluation if the symptoms worsen or if the condition fails to improve as anticipated.  I provided 23 minutes of non-face-to-face time during this encounter.   Megan Salon, MD

## 2021-10-10 ENCOUNTER — Encounter (HOSPITAL_BASED_OUTPATIENT_CLINIC_OR_DEPARTMENT_OTHER): Payer: Self-pay | Admitting: Obstetrics & Gynecology

## 2021-10-12 ENCOUNTER — Ambulatory Visit (INDEPENDENT_AMBULATORY_CARE_PROVIDER_SITE_OTHER): Payer: Medicare Other | Admitting: Internal Medicine

## 2021-10-12 ENCOUNTER — Encounter: Payer: Self-pay | Admitting: Internal Medicine

## 2021-10-12 VITALS — BP 100/60 | HR 81 | Ht 63.5 in | Wt 110.5 lb

## 2021-10-12 DIAGNOSIS — K581 Irritable bowel syndrome with constipation: Secondary | ICD-10-CM | POA: Diagnosis not present

## 2021-10-12 DIAGNOSIS — R0989 Other specified symptoms and signs involving the circulatory and respiratory systems: Secondary | ICD-10-CM | POA: Diagnosis not present

## 2021-10-12 DIAGNOSIS — M25571 Pain in right ankle and joints of right foot: Secondary | ICD-10-CM | POA: Diagnosis not present

## 2021-10-12 DIAGNOSIS — M545 Low back pain, unspecified: Secondary | ICD-10-CM | POA: Diagnosis not present

## 2021-10-12 DIAGNOSIS — R140 Abdominal distension (gaseous): Secondary | ICD-10-CM | POA: Diagnosis not present

## 2021-10-12 DIAGNOSIS — I2510 Atherosclerotic heart disease of native coronary artery without angina pectoris: Secondary | ICD-10-CM | POA: Diagnosis not present

## 2021-10-12 DIAGNOSIS — M25511 Pain in right shoulder: Secondary | ICD-10-CM | POA: Diagnosis not present

## 2021-10-12 DIAGNOSIS — K219 Gastro-esophageal reflux disease without esophagitis: Secondary | ICD-10-CM | POA: Diagnosis not present

## 2021-10-12 DIAGNOSIS — M542 Cervicalgia: Secondary | ICD-10-CM | POA: Diagnosis not present

## 2021-10-12 NOTE — Patient Instructions (Signed)
Continue your Omeprazole  Please follow up in about 6 months

## 2021-10-12 NOTE — Progress Notes (Signed)
HISTORY OF PRESENT ILLNESS:  Madison Kramer is a 75 y.o. female with a history of irritable bowel syndrome and health related anxiety who was evaluated in the office August 30, 2021 regarding chronic throat clearing behavior of uncertain etiology.  See that dictation for details.  At that time, the patient recently initiated a trial of apple cider vinegar which she thinks may have helped.  She was told to continue this for a while.  She stopped shortly after initiating PPI.  We prescribed, empirically, pantoprazole 40 mg twice daily.  She had some intolerance, and this was changed to omeprazole 40 mg twice daily follow-up at this time recommended.  She tells me that the throat clearing behavior is improved, though not absent.  He tells me that the medication seems to have helped her constipation and bloating.  I informed her that this medicine is prescribed to reduced gastric acidity and not necessarily to assist with constipation or bloating.  Regardless, I am happy she is feeling better.  No new GI complaints  REVIEW OF SYSTEMS:  All non-GI ROS negative unless otherwise stated in the HPI except for  Past Medical History:  Diagnosis Date   Allergic rhinitis    Anxiety    Arthritis    Asthma    Atypical chest pain    Possibly from DES   Back pain    Cataract    Hx; of   Diverticulosis    Left   Elevated cholesterol    GERD (gastroesophageal reflux disease)    Hemoptysis 2007   From reflux pharyngitis   History of nuclear stress test    Myoview 5/17: EF 70%, no scar or ischemia, excellent exercise capacity, low risk   Hypertension    Insomnia    Lactose intolerance    Lichen sclerosus et atrophicus of the vulva 2008   Biopsy-proven   Nocturia    Osteoporosis 10/2015   T score -2.5 left femoral neck   Pneumonia    Raynaud's syndrome    "possible"    Past Surgical History:  Procedure Laterality Date   BREAST SURGERY  2001   REDUCTION MAMMAPLASTY   COLONOSCOPY     FOOT  SURGERY  2009   HYSTEROSCOPY  2003   HYSTEROSCOPIC RESECTION OF MYOMA   INGUINAL HERNIA REPAIR  1971   BILATERAL   LUMBAR LAMINECTOMY/DECOMPRESSION MICRODISCECTOMY Right 08/30/2013   Procedure: Right Lumbar four-five Extraforaminal Diskectomy;  Surgeon: Hosie Spangle, MD;  Location: [facility];  Service: Neurosurgery;  Laterality: Right;   MANDIBLE SURGERY     MYOMECTOMY     TONSILLECTOMY     TUBAL LIGATION  1987    Social History CAMRON ESSMAN  reports that she quit smoking about 32 years ago. Her smoking use included cigarettes. She has never used smokeless tobacco. She reports current alcohol use of about 7.0 standard drinks per week. She reports that she does not use drugs.  family history includes Breast cancer in her maternal aunt; Diabetes in her mother; Heart disease in her maternal uncle, mother, and paternal grandfather; Hypertension in her mother; Multiple myeloma in her father.  Allergies  Allergen Reactions   Atorvastatin Other (See Comments)    Pt reports causes lower extremity muscle cramps   Codeine Nausea Only   Erythromycin Other (See Comments)    Stomach cramping   Macrobid [Nitrofurantoin] Nausea Only    headache   Sulfa Antibiotics     As a child   Sulfamethoxazole Other (See  Comments)    Unsdure of reaction. Had as a child.   Sulfonamide Derivatives    Tetracyclines & Related     unknown   Tramadol Nausea Only and Other (See Comments)    Dizziness and mind racing       PHYSICAL EXAMINATION: Vital signs: BP 100/60   Pulse 81   Ht 5' 3.5" (1.613 m)   Wt 110 lb 8 oz (50.1 kg)   BMI 19.27 kg/m   Constitutional: generally well-appearing, no acute distress Psychiatric: alert and oriented x3, cooperative Eyes: extraocular movements intact, anicteric, conjunctiva pink Mouth: oral pharynx moist, no lesions Neck: supple no lymphadenopathy Cardiovascular: heart regular rate and rhythm, no murmur Lungs: clear to auscultation bilaterally Abdomen:  soft, nontender, nondistended, no obvious ascites, no peritoneal signs, normal bowel sounds, no organomegaly Rectal: Omitted Extremities: no lower extremity edema bilaterally Skin: no lesions on visible extremities Neuro: No focal deficits.   ASSESSMENT:  1.  Chronic throat clearing behavior.  Query GERD.  Negative EGD December 2013.  Reports incomplete, but definite, improvement on twice daily PPI 2.  Constipation predominant IBS.  Feels like her bowels are doing better.  Less bloating as well 3.  Diverticulosis 4.  Negative for neoplasia colonoscopy 2018.  Aged out of surveillance 5.  Health related anxiety   PLAN:  1.  Discussion on PPIs. 2.  Continue omeprazole 40 mg twice daily 3.  Routine GI office follow-up 6 months

## 2021-11-07 DIAGNOSIS — H16011 Central corneal ulcer, right eye: Secondary | ICD-10-CM | POA: Diagnosis not present

## 2021-11-08 DIAGNOSIS — H16011 Central corneal ulcer, right eye: Secondary | ICD-10-CM | POA: Diagnosis not present

## 2021-11-10 DIAGNOSIS — H16011 Central corneal ulcer, right eye: Secondary | ICD-10-CM | POA: Diagnosis not present

## 2021-11-12 DIAGNOSIS — H16011 Central corneal ulcer, right eye: Secondary | ICD-10-CM | POA: Diagnosis not present

## 2021-11-15 DIAGNOSIS — H16011 Central corneal ulcer, right eye: Secondary | ICD-10-CM | POA: Diagnosis not present

## 2021-11-15 DIAGNOSIS — H26491 Other secondary cataract, right eye: Secondary | ICD-10-CM | POA: Diagnosis not present

## 2021-11-19 DIAGNOSIS — H26491 Other secondary cataract, right eye: Secondary | ICD-10-CM | POA: Diagnosis not present

## 2021-11-23 DIAGNOSIS — H16201 Unspecified keratoconjunctivitis, right eye: Secondary | ICD-10-CM | POA: Diagnosis not present

## 2021-11-23 DIAGNOSIS — Z961 Presence of intraocular lens: Secondary | ICD-10-CM | POA: Diagnosis not present

## 2021-11-24 DIAGNOSIS — J3089 Other allergic rhinitis: Secondary | ICD-10-CM | POA: Diagnosis not present

## 2021-11-26 DIAGNOSIS — J3089 Other allergic rhinitis: Secondary | ICD-10-CM | POA: Diagnosis not present

## 2021-11-29 DIAGNOSIS — J3089 Other allergic rhinitis: Secondary | ICD-10-CM | POA: Diagnosis not present

## 2021-11-30 DIAGNOSIS — H16201 Unspecified keratoconjunctivitis, right eye: Secondary | ICD-10-CM | POA: Diagnosis not present

## 2021-12-02 DIAGNOSIS — J3089 Other allergic rhinitis: Secondary | ICD-10-CM | POA: Diagnosis not present

## 2021-12-25 ENCOUNTER — Other Ambulatory Visit: Payer: Self-pay | Admitting: Gastroenterology

## 2021-12-25 ENCOUNTER — Other Ambulatory Visit: Payer: Self-pay | Admitting: Nurse Practitioner

## 2021-12-25 ENCOUNTER — Telehealth: Payer: Self-pay | Admitting: Nurse Practitioner

## 2021-12-25 MED ORDER — OMEPRAZOLE 40 MG PO CPDR: 40.0000 mg | DELAYED_RELEASE_CAPSULE | Freq: Two times a day (BID) | ORAL | 3 refills | Status: DC

## 2021-12-25 NOTE — Telephone Encounter (Signed)
The answering service this a.m.  She is completely out of omeprazole and requesting a refill.  Patient will make an appointment to follow-up with our office as she tells me that she is due for an appointment.  [facility]  Omeprazole 40 mg twice daily #60 with 3 refills

## 2021-12-29 DIAGNOSIS — K219 Gastro-esophageal reflux disease without esophagitis: Secondary | ICD-10-CM | POA: Diagnosis not present

## 2021-12-29 DIAGNOSIS — J3089 Other allergic rhinitis: Secondary | ICD-10-CM | POA: Diagnosis not present

## 2021-12-29 DIAGNOSIS — J4530 Mild persistent asthma, uncomplicated: Secondary | ICD-10-CM | POA: Diagnosis not present

## 2021-12-29 DIAGNOSIS — H1045 Other chronic allergic conjunctivitis: Secondary | ICD-10-CM | POA: Diagnosis not present

## 2022-01-06 DIAGNOSIS — I10 Essential (primary) hypertension: Secondary | ICD-10-CM | POA: Diagnosis not present

## 2022-01-06 DIAGNOSIS — Z0000 Encounter for general adult medical examination without abnormal findings: Secondary | ICD-10-CM | POA: Diagnosis not present

## 2022-01-06 DIAGNOSIS — M542 Cervicalgia: Secondary | ICD-10-CM | POA: Diagnosis not present

## 2022-01-06 DIAGNOSIS — K219 Gastro-esophageal reflux disease without esophagitis: Secondary | ICD-10-CM | POA: Diagnosis not present

## 2022-01-06 DIAGNOSIS — Z5181 Encounter for therapeutic drug level monitoring: Secondary | ICD-10-CM | POA: Diagnosis not present

## 2022-01-06 DIAGNOSIS — Z79899 Other long term (current) drug therapy: Secondary | ICD-10-CM | POA: Diagnosis not present

## 2022-01-06 DIAGNOSIS — E7800 Pure hypercholesterolemia, unspecified: Secondary | ICD-10-CM | POA: Diagnosis not present

## 2022-01-06 DIAGNOSIS — Z1389 Encounter for screening for other disorder: Secondary | ICD-10-CM | POA: Diagnosis not present

## 2022-01-06 DIAGNOSIS — M810 Age-related osteoporosis without current pathological fracture: Secondary | ICD-10-CM | POA: Diagnosis not present

## 2022-01-19 DIAGNOSIS — M545 Low back pain, unspecified: Secondary | ICD-10-CM | POA: Diagnosis not present

## 2022-01-19 DIAGNOSIS — M542 Cervicalgia: Secondary | ICD-10-CM | POA: Diagnosis not present

## 2022-01-19 DIAGNOSIS — M25511 Pain in right shoulder: Secondary | ICD-10-CM | POA: Diagnosis not present

## 2022-02-01 DIAGNOSIS — J3089 Other allergic rhinitis: Secondary | ICD-10-CM | POA: Diagnosis not present

## 2022-02-09 DIAGNOSIS — M542 Cervicalgia: Secondary | ICD-10-CM | POA: Diagnosis not present

## 2022-02-09 DIAGNOSIS — M25511 Pain in right shoulder: Secondary | ICD-10-CM | POA: Diagnosis not present

## 2022-02-09 DIAGNOSIS — M545 Low back pain, unspecified: Secondary | ICD-10-CM | POA: Diagnosis not present

## 2022-02-28 DIAGNOSIS — J3089 Other allergic rhinitis: Secondary | ICD-10-CM | POA: Diagnosis not present

## 2022-03-01 DIAGNOSIS — E538 Deficiency of other specified B group vitamins: Secondary | ICD-10-CM | POA: Diagnosis not present

## 2022-03-07 ENCOUNTER — Other Ambulatory Visit: Payer: Self-pay

## 2022-03-07 DIAGNOSIS — I10 Essential (primary) hypertension: Secondary | ICD-10-CM

## 2022-03-07 DIAGNOSIS — E782 Mixed hyperlipidemia: Secondary | ICD-10-CM

## 2022-03-07 DIAGNOSIS — I2510 Atherosclerotic heart disease of native coronary artery without angina pectoris: Secondary | ICD-10-CM

## 2022-03-07 MED ORDER — LOSARTAN POTASSIUM 25 MG PO TABS: 25.0000 mg | ORAL_TABLET | Freq: Every day | ORAL | 0 refills | Status: DC

## 2022-03-14 ENCOUNTER — Ambulatory Visit: Payer: Medicare Other | Admitting: Internal Medicine

## 2022-03-31 DIAGNOSIS — L814 Other melanin hyperpigmentation: Secondary | ICD-10-CM | POA: Diagnosis not present

## 2022-03-31 DIAGNOSIS — B078 Other viral warts: Secondary | ICD-10-CM | POA: Diagnosis not present

## 2022-03-31 DIAGNOSIS — L821 Other seborrheic keratosis: Secondary | ICD-10-CM | POA: Diagnosis not present

## 2022-03-31 DIAGNOSIS — L720 Epidermal cyst: Secondary | ICD-10-CM | POA: Diagnosis not present

## 2022-03-31 DIAGNOSIS — D225 Melanocytic nevi of trunk: Secondary | ICD-10-CM | POA: Diagnosis not present

## 2022-04-01 DIAGNOSIS — J3089 Other allergic rhinitis: Secondary | ICD-10-CM | POA: Diagnosis not present

## 2022-04-08 ENCOUNTER — Encounter: Payer: Self-pay | Admitting: Internal Medicine

## 2022-04-08 ENCOUNTER — Ambulatory Visit (INDEPENDENT_AMBULATORY_CARE_PROVIDER_SITE_OTHER): Payer: Medicare Other | Admitting: Internal Medicine

## 2022-04-08 VITALS — BP 112/64 | HR 66 | Ht 63.0 in | Wt 110.0 lb

## 2022-04-08 DIAGNOSIS — R0989 Other specified symptoms and signs involving the circulatory and respiratory systems: Secondary | ICD-10-CM | POA: Diagnosis not present

## 2022-04-08 DIAGNOSIS — K581 Irritable bowel syndrome with constipation: Secondary | ICD-10-CM

## 2022-04-08 DIAGNOSIS — K219 Gastro-esophageal reflux disease without esophagitis: Secondary | ICD-10-CM | POA: Diagnosis not present

## 2022-04-08 DIAGNOSIS — I2510 Atherosclerotic heart disease of native coronary artery without angina pectoris: Secondary | ICD-10-CM | POA: Diagnosis not present

## 2022-04-08 MED ORDER — OMEPRAZOLE 40 MG PO CPDR: 40.0000 mg | DELAYED_RELEASE_CAPSULE | Freq: Every day | ORAL | 3 refills | Status: DC

## 2022-04-08 NOTE — Progress Notes (Signed)
HISTORY OF PRESENT ILLNESS: ? ?Madison Kramer is a 76 y.o. female with a history of irritable bowel syndrome and health related anxiety who was last evaluated in the office October 12, 2021 regarding chronic throat clearing behavior.  See that dictation for details.  The question of GERD as an etiology was raised.  At that time she seemed to be doing better on twice daily PPI.  I told her to continue on twice daily PPI and follow-up in 6 months.  She tells me that several months ago her PPI was dropped to once a day because her PCP was concerned about low B12 level.  She took B12 replacement therapy which resulted in normalization of her B12 level.  She tells me that decreasing omeprazole to once daily has not resulted in any negative effects.  She continues doing better with regards to throat clearing behavior.  Her other chronic GI complaints also seem to be doing well.  She does request medication refill. ? ?REVIEW OF SYSTEMS: ? ?All non-GI ROS negative unless otherwise stated in the HPI. ? ?Past Medical History:  ?Diagnosis Date  ? Allergic rhinitis   ? Anxiety   ? Arthritis   ? Asthma   ? Atypical chest pain   ? Possibly from DES  ? Back pain   ? Cataract   ? Hx; of  ? Diverticulosis   ? Left  ? Elevated cholesterol   ? GERD (gastroesophageal reflux disease)   ? Hemoptysis 2007  ? From reflux pharyngitis  ? History of nuclear stress test   ? Myoview 5/17: EF 70%, no scar or ischemia, excellent exercise capacity, low risk  ? Hypertension   ? Insomnia   ? Lactose intolerance   ? Lichen sclerosus et atrophicus of the vulva 2008  ? Biopsy-proven  ? Nocturia   ? Osteoporosis 10/2015  ? T score -2.5 left femoral neck  ? Pneumonia   ? Raynaud's syndrome   ? "possible"  ? ? ?Past Surgical History:  ?Procedure Laterality Date  ? BREAST SURGERY  2001  ? REDUCTION MAMMAPLASTY  ? COLONOSCOPY    ? FOOT SURGERY  2009  ? HYSTEROSCOPY  2003  ? HYSTEROSCOPIC RESECTION OF MYOMA  ? [place]  ? BILATERAL  ?  LUMBAR LAMINECTOMY/DECOMPRESSION MICRODISCECTOMY Right 08/30/2013  ? Procedure: Right Lumbar four-five Extraforaminal Diskectomy;  Surgeon: Hosie Spangle, MD;  Location: [facility];  Service: Neurosurgery;  Laterality: Right;  ? MANDIBLE SURGERY    ? MYOMECTOMY    ? TONSILLECTOMY    ? TUBAL LIGATION  1987  ? ? ?Social History ?NAIMAH YINGST  reports that she quit smoking about 33 years ago. Her smoking use included cigarettes. She has never used smokeless tobacco. She reports current alcohol use of about 7.0 standard drinks per week. She reports that she does not use drugs. ? ?family history includes Breast cancer in her maternal aunt; Diabetes in her mother; Heart disease in her maternal uncle, mother, and paternal grandfather; Hypertension in her mother; Multiple myeloma in her father. ? ?Allergies  ?Allergen Reactions  ? Atorvastatin Other (See Comments)  ?  Pt reports causes lower extremity muscle cramps  ? Codeine Nausea Only  ? Erythromycin Other (See Comments)  ?  Stomach cramping  ? Macrobid [Nitrofurantoin] Nausea Only  ?  headache  ? Sulfa Antibiotics   ?  As a child  ? Sulfamethoxazole Other (See Comments)  ?  Unsdure of reaction. Had as a child.  ?  Sulfonamide Derivatives   ? Tetracyclines & Related   ?  unknown  ? Tramadol Nausea Only and Other (See Comments)  ?  Dizziness and mind racing  ? ? ?  ? ?PHYSICAL EXAMINATION: ?Vital signs: BP 112/64   Pulse 66   Ht '5\' 3"'  (1.6 m)   Wt 110 lb (49.9 kg)   SpO2 96%   BMI 19.49 kg/m?   ?Constitutional: generally well-appearing, no acute distress ?Psychiatric: alert and oriented x3, cooperative ?Eyes: extraocular movements intact, anicteric, conjunctiva pink ?Mouth: oral pharynx moist, no lesions ?Neck: supple no lymphadenopathy ?Cardiovascular: heart regular rate and rhythm, no murmur ?Lungs: clear to auscultation bilaterally ?Abdomen: soft, nontender, nondistended, no obvious ascites, no peritoneal signs, normal bowel sounds, no organomegaly ?Rectal:  Omitted ?Extremities: no clubbing, cyanosis, or lower extremity edema bilaterally ?Skin: no lesions on visible extremities ?Neuro: No focal deficits.  Cranial nerves intact ? ?ASSESSMENT: ?  ?1.  Chronic throat clearing behavior.  Query GERD.  Negative EGD December 2013.  Reports nearly complete improvement in symptoms on PPI therapy.  Initially twice daily.  Currently once daily. ?2.  Constipation predominant IBS.  Feels like her bowels are doing better.  Less bloating as well ?3.  Diverticulosis ?4.  Negative for neoplasia colonoscopy 2018.  Aged out of surveillance ?5.  Health related anxiety ?  ?  ?PLAN: ?  ?1.  Discussion on PPIs. ?2.  Continue omeprazole 40 mg once daily.  Medication refilled.  Medication risks reviewed ?3.  Routine GI office follow-up 1 year ?  ?  ?  ?  ? ? ?  ?

## 2022-04-08 NOTE — Patient Instructions (Signed)
If you are age 75 or older, your body mass index should be between 23-30. Your Body mass index is 19.49 kg/m?Marland Kitchen If this is out of the aforementioned range listed, please consider follow up with your Primary Care Provider. ? ?If you are age 36 or younger, your body mass index should be between 19-25. Your Body mass index is 19.49 kg/m?Marland Kitchen If this is out of the aformentioned range listed, please consider follow up with your Primary Care Provider.  ? ?________________________________________________________ ? ?The [facility] providers would like to encourage you to use [facility] to communicate with providers for non-urgent requests or questions.  Due to long hold times on the telephone, sending your provider a message by [facility] may be a faster and more efficient way to get a response.  Please allow 48 business hours for a response.  Please remember that this is for non-urgent requests.  ? ?We have sent the following medications to your pharmacy for you to pick up at your convenience:  Omeprazole. ? ?Please follow up in one year ? ?

## 2022-04-13 ENCOUNTER — Other Ambulatory Visit: Payer: Self-pay

## 2022-04-13 DIAGNOSIS — E782 Mixed hyperlipidemia: Secondary | ICD-10-CM

## 2022-04-13 DIAGNOSIS — I10 Essential (primary) hypertension: Secondary | ICD-10-CM

## 2022-04-13 DIAGNOSIS — I2510 Atherosclerotic heart disease of native coronary artery without angina pectoris: Secondary | ICD-10-CM

## 2022-04-13 MED ORDER — LOSARTAN POTASSIUM 25 MG PO TABS: 25.0000 mg | ORAL_TABLET | Freq: Every day | ORAL | 0 refills | Status: DC

## 2022-04-21 ENCOUNTER — Other Ambulatory Visit: Payer: Self-pay

## 2022-04-21 DIAGNOSIS — E782 Mixed hyperlipidemia: Secondary | ICD-10-CM

## 2022-04-21 DIAGNOSIS — I2510 Atherosclerotic heart disease of native coronary artery without angina pectoris: Secondary | ICD-10-CM

## 2022-04-21 DIAGNOSIS — I10 Essential (primary) hypertension: Secondary | ICD-10-CM

## 2022-04-21 MED ORDER — LOSARTAN POTASSIUM 25 MG PO TABS: 25.0000 mg | ORAL_TABLET | Freq: Every day | ORAL | 0 refills | Status: DC

## 2022-04-21 NOTE — Telephone Encounter (Signed)
Pt's medication was sent to pt's pharmacy as requested. Confirmation received.  °

## 2022-04-29 DIAGNOSIS — J3089 Other allergic rhinitis: Secondary | ICD-10-CM | POA: Diagnosis not present

## 2022-04-29 DIAGNOSIS — J3081 Allergic rhinitis due to animal (cat) (dog) hair and dander: Secondary | ICD-10-CM | POA: Diagnosis not present

## 2022-04-29 DIAGNOSIS — J301 Allergic rhinitis due to pollen: Secondary | ICD-10-CM | POA: Diagnosis not present

## 2022-05-04 DIAGNOSIS — M25511 Pain in right shoulder: Secondary | ICD-10-CM | POA: Diagnosis not present

## 2022-05-04 DIAGNOSIS — M542 Cervicalgia: Secondary | ICD-10-CM | POA: Diagnosis not present

## 2022-05-04 DIAGNOSIS — M545 Low back pain, unspecified: Secondary | ICD-10-CM | POA: Diagnosis not present

## 2022-05-11 DIAGNOSIS — G8929 Other chronic pain: Secondary | ICD-10-CM | POA: Diagnosis not present

## 2022-05-11 DIAGNOSIS — M810 Age-related osteoporosis without current pathological fracture: Secondary | ICD-10-CM | POA: Diagnosis not present

## 2022-05-11 DIAGNOSIS — K219 Gastro-esophageal reflux disease without esophagitis: Secondary | ICD-10-CM | POA: Diagnosis not present

## 2022-05-11 DIAGNOSIS — I10 Essential (primary) hypertension: Secondary | ICD-10-CM | POA: Diagnosis not present

## 2022-05-11 DIAGNOSIS — E7800 Pure hypercholesterolemia, unspecified: Secondary | ICD-10-CM | POA: Diagnosis not present

## 2022-05-11 DIAGNOSIS — G4700 Insomnia, unspecified: Secondary | ICD-10-CM | POA: Diagnosis not present

## 2022-05-24 DIAGNOSIS — R29898 Other symptoms and signs involving the musculoskeletal system: Secondary | ICD-10-CM | POA: Diagnosis not present

## 2022-05-24 DIAGNOSIS — M778 Other enthesopathies, not elsewhere classified: Secondary | ICD-10-CM | POA: Diagnosis not present

## 2022-05-24 DIAGNOSIS — M1812 Unilateral primary osteoarthritis of first carpometacarpal joint, left hand: Secondary | ICD-10-CM | POA: Diagnosis not present

## 2022-05-24 DIAGNOSIS — M25532 Pain in left wrist: Secondary | ICD-10-CM | POA: Diagnosis not present

## 2022-05-24 DIAGNOSIS — M79602 Pain in left arm: Secondary | ICD-10-CM | POA: Diagnosis not present

## 2022-06-01 DIAGNOSIS — M1812 Unilateral primary osteoarthritis of first carpometacarpal joint, left hand: Secondary | ICD-10-CM | POA: Diagnosis not present

## 2022-06-01 DIAGNOSIS — M778 Other enthesopathies, not elsewhere classified: Secondary | ICD-10-CM | POA: Diagnosis not present

## 2022-06-01 DIAGNOSIS — M79602 Pain in left arm: Secondary | ICD-10-CM | POA: Diagnosis not present

## 2022-06-01 DIAGNOSIS — R29898 Other symptoms and signs involving the musculoskeletal system: Secondary | ICD-10-CM | POA: Diagnosis not present

## 2022-06-03 ENCOUNTER — Other Ambulatory Visit: Payer: Self-pay

## 2022-06-03 DIAGNOSIS — I25119 Atherosclerotic heart disease of native coronary artery with unspecified angina pectoris: Secondary | ICD-10-CM

## 2022-06-03 DIAGNOSIS — J301 Allergic rhinitis due to pollen: Secondary | ICD-10-CM | POA: Diagnosis not present

## 2022-06-03 DIAGNOSIS — J3089 Other allergic rhinitis: Secondary | ICD-10-CM | POA: Diagnosis not present

## 2022-06-03 DIAGNOSIS — I2510 Atherosclerotic heart disease of native coronary artery without angina pectoris: Secondary | ICD-10-CM

## 2022-06-03 DIAGNOSIS — E782 Mixed hyperlipidemia: Secondary | ICD-10-CM

## 2022-06-03 DIAGNOSIS — J3081 Allergic rhinitis due to animal (cat) (dog) hair and dander: Secondary | ICD-10-CM | POA: Diagnosis not present

## 2022-06-03 DIAGNOSIS — Z79899 Other long term (current) drug therapy: Secondary | ICD-10-CM

## 2022-06-03 MED ORDER — ROSUVASTATIN CALCIUM 20 MG PO TABS: 20.0000 mg | ORAL_TABLET | Freq: Every day | ORAL | 0 refills | Status: DC

## 2022-06-06 DIAGNOSIS — M1812 Unilateral primary osteoarthritis of first carpometacarpal joint, left hand: Secondary | ICD-10-CM | POA: Diagnosis not present

## 2022-06-06 DIAGNOSIS — M778 Other enthesopathies, not elsewhere classified: Secondary | ICD-10-CM | POA: Diagnosis not present

## 2022-06-06 DIAGNOSIS — M79602 Pain in left arm: Secondary | ICD-10-CM | POA: Diagnosis not present

## 2022-06-06 DIAGNOSIS — R29898 Other symptoms and signs involving the musculoskeletal system: Secondary | ICD-10-CM | POA: Diagnosis not present

## 2022-06-20 ENCOUNTER — Ambulatory Visit: Payer: Medicare Other | Admitting: Cardiology

## 2022-06-20 ENCOUNTER — Encounter: Payer: Self-pay | Admitting: Cardiology

## 2022-06-20 ENCOUNTER — Ambulatory Visit (INDEPENDENT_AMBULATORY_CARE_PROVIDER_SITE_OTHER): Payer: Medicare Other | Admitting: Cardiology

## 2022-06-20 DIAGNOSIS — I10 Essential (primary) hypertension: Secondary | ICD-10-CM | POA: Diagnosis not present

## 2022-06-20 DIAGNOSIS — E782 Mixed hyperlipidemia: Secondary | ICD-10-CM | POA: Diagnosis not present

## 2022-06-20 DIAGNOSIS — I2510 Atherosclerotic heart disease of native coronary artery without angina pectoris: Secondary | ICD-10-CM

## 2022-06-20 LAB — LIPID PANEL
Chol/HDL Ratio: 2.6 ratio (ref 0.0–4.4)
Cholesterol, Total: 177 mg/dL (ref 100–199)
HDL: 68 mg/dL (ref >39)
LDL Chol Calc (NIH): 92 mg/dL (ref 0–99)
Triglycerides: 95 mg/dL (ref 0–149)
VLDL Cholesterol Cal: 17 mg/dL (ref 5–40)

## 2022-06-20 MED ORDER — LOSARTAN POTASSIUM 25 MG PO TABS: 12.5000 mg | ORAL_TABLET | Freq: Every day | ORAL | 3 refills | Status: DC

## 2022-06-20 NOTE — Progress Notes (Signed)
Cardiology Office Note:    Date:  06/20/2022   ID:  Madison Kramer, DOB Sep 25, 1946, MRN [id number]  PCP:  [name], MD   [place]  Cardiologist:  Freada Bergeron, MD  Advanced Practice Provider:  No care team member to display Electrophysiologist:  None   Referring MD: [name], MD     History of Present Illness:    Madison Kramer is a 76 y.o. 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 Meda Coffee now returning to clinic for follow-up.  Last saw Dr. Meda Coffee in 02/03/20 where she was doing well. Her LDL was elevated at 112 and her pravastatin was increased to 75m daily later changed to crestor 131mdaily.   During the last visit in 02/18/2021, the patient was doing well. Remained very active.  Today, the patient feels well. Remains very active and exercises daily with no exertional symptoms. Blood pressure is on the softer side in low 100s. No dizziness, lightheadedness, syncope. Tolerating medications without issues.   The patient denies chest pain, nocturnal dyspnea, orthopnea or peripheral edema. There have been no palpitations, lightheadedness or syncope. Complains of shortness of breath.   Past Medical History:  Diagnosis Date   Allergic rhinitis    Anxiety    Arthritis    Asthma    Atypical chest pain    Possibly from DES   Back pain    Cataract    Hx; of   Diverticulosis    Left   Elevated cholesterol    GERD (gastroesophageal reflux disease)    Hemoptysis 2007   From reflux pharyngitis   History of nuclear stress test    Myoview 5/17: EF 70%, no scar or ischemia, excellent exercise capacity, low risk   Hypertension    Insomnia    Lactose intolerance    Lichen sclerosus et atrophicus of the vulva 2008   Biopsy-proven   Nocturia    Osteoporosis 10/2015   T score -2.5 left femoral neck   Pneumonia    Raynaud's syndrome     "possible"    Past Surgical History:  Procedure Laterality Date   BREAST SURGERY  2001   REDUCTION MAMMAPLASTY   COLONOSCOPY     FOOT SURGERY  2009   HYSTEROSCOPY  2003   HYSTEROSCOPIC RESECTION OF MYOMA   INGUINAL HERNIA REPAIR  1971   BILATERAL   LUMBAR LAMINECTOMY/DECOMPRESSION MICRODISCECTOMY Right 08/30/2013   Procedure: Right Lumbar four-five Extraforaminal Diskectomy;  Surgeon: RoHosie SpangleMD;  [facility];  Service: Neurosurgery;  Laterality: Right;   MANDIBLE SURGERY     MYOMECTOMY     TONSILLECTOMY     TUBAL LIGATION  1987    Current Medications: Current Meds  Medication Sig   Ascorbic Acid (VITAMIN C) 1000 MG tablet Take 3,000 mg by mouth daily.   Calcium Carbonate-Vitamin D (CALCIUM + D PO) Take 1,200 mg by mouth.   Cholecalciferol (VITAMIN D PO) Take 2,000 Units by mouth daily.    EPINEPHrine 0.3 mg/0.3 mL IJ SOAJ injection USE AS DIRECTED AS NEEDED SYSTEMIC REACTION   fluticasone (FLONASE) 50 MCG/ACT nasal spray Place 1 spray into both nostrils as needed.    Glucosamine-Chondroitin-MSM 500-200-150 MG TABS Take 1 tablet by mouth 2 (two) times daily.   MELATONIN PO Take 1 mg by mouth every other day. gummy   meloxicam (MOBIC) 7.5 MG tablet Take  7.5 mg by mouth as needed.    omeprazole (PRILOSEC) 40 MG capsule Take 1 capsule (40 mg total) by mouth daily.   Probiotic Product (PROBIOTIC DAILY PO) Take 1 tablet by mouth 2 (two) times daily.    rosuvastatin (CRESTOR) 20 MG tablet Take 1 tablet (20 mg total) by mouth daily.   sertraline (ZOLOFT) 50 MG tablet Take 50 mg by mouth daily.   TURMERIC PO Take 1 tablet by mouth 2 (two) times daily.    [DISCONTINUED] losartan (COZAAR) 25 MG tablet Take 1 tablet (25 mg total) by mouth daily. Please keep upcominig appt with Dr. Johney Frame in July 2023 before anymore refills. Thank you Final attempt     Allergies:   Atorvastatin, Codeine, Erythromycin, Macrobid [nitrofurantoin], Sulfa antibiotics, Sulfamethoxazole,  Sulfonamide derivatives, Tetracyclines & related, and Tramadol   Social History   Socioeconomic History   Marital status: Divorced    Spouse name: Not on file   Number of children: 2   Years of education: Not on file   Highest education level: Not on file  Occupational History   Occupation: REaltor    Employer: [employer]  Tobacco Use   Smoking status: Former    Years: 20.00    Types: Cigarettes    Quit date: 12/12/1988    Years since quitting: 33.5   Smokeless tobacco: Never  Vaping Use   Vaping Use: Never used  Substance and Sexual Activity   Alcohol use: Yes    Alcohol/week: 7.0 standard drinks of alcohol    Types: 7 Standard drinks or equivalent per week    Comment: 7 glasses   Drug use: No   Sexual activity: Not Currently    Birth control/protection: Post-menopausal    Comment: 1st intercourse 76 yo-Fewer than 5 partners  Other Topics Concern   Not on file  Social History Narrative   Not on file   Social Determinants of Health   Financial Resource Strain: Not on file  Food Insecurity: Not on file  Transportation Needs: Not on file  Physical Activity: Not on file  Stress: Not on file  Social Connections: Not on file     Family History: The patient's family history includes Breast cancer in her maternal aunt; Diabetes in her mother; Heart disease in her maternal uncle, mother, and paternal grandfather; Hypertension in her mother; Multiple myeloma in her father.  ROS:   Please see the history of present illness. (+) Shortness of Breath All other systems are reviewed and negative.     EKGs/Labs/Other Studies Reviewed:    The following studies were reviewed today: Coronary CT 11/2018 Coronary Arteries:  Normal coronary origin.  Right dominance.   RCA is a large dominant artery that gives rise to PDA and PLVB. There is mild calcified plaque in the mid RCA with associated stenosis 25-50%.   Left main is a large artery that gives rise to LAD and LCX  arteries.   LAD is a large vessel that gives rise to one diagonal artery. There is a moderate focal calcified plaque in the proximal LAD with stenosis 25-50%, but possibly 50-69%. Mid LAD has a mild calcified plaque with stenosis 25-50%.   D1 has no significant plaque.   LCX is a non-dominant artery that gives rise to one OM1 branch. There is no plaque.   Other findings:   Normal pulmonary vein drainage into the left atrium.   Normal let atrial appendage without a thrombus.   Normal size of the pulmonary artery.  IMPRESSION: 1. Coronary calcium score of 255. This was 72 percentile for age and sex matched control.   2. Normal coronary origin with right dominance.   3. Mild CAD in the proximal and mid LAD and mid RCA. Additional analysis with CT FFR are recommended.   1. Left Main:  No significant stenosis.   2. LAD: No significant stenosis. 3. LCX: No significant stenosis. 4. RCA: No significant stenosis.   IMPRESSION:   CT FFR analysis didn't show any significant stenosis.     Myoview Study Highlights 04/2016   Nuclear stress EF: 70%. The study is normal. This is a low risk study. The left ventricular ejection fraction is hyperdynamic (>65%). There was no ST segment deviation noted during stress.   Normal exercise nuclear stress test with no evidence of prior infarct or ischemia. Normal BP response to stress. Excellent exercise capacity.      Echo Study Conclusions 2014  - Left ventricle: The cavity size was normal. Wall thickness    was normal. Systolic function was normal. The estimated    ejection fraction was in the range of 55% to 60%. Wall    motion was normal; there were no regional wall motion    abnormalities. Doppler parameters are consistent with    abnormal left ventricular relaxation (grade 1 diastolic    dysfunction).  - Mitral valve: Valve area by pressure half-time: 1.73cm^2.  - Atrial septum: No defect or patent foramen ovale was     identified.  Impressions:   - No significant abnormalities for age.     EKG:  EKG is personally reviewed.  06/20/2022 EKG: NSR with HR 62  02/18/2021 EKG: The ekg ordered today demonstrates sinus bradycardia HR 55  Recent Labs: No results found for requested labs within last 365 days.  Recent Lipid Panel    Component Value Date/Time   CHOL 160 06/09/2021 0851   CHOL 200 (H) 09/28/2016 0900   CHOL 158 08/18/2015 0946   TRIG 160 (H) 06/09/2021 0851   TRIG 149 09/28/2016 0900   TRIG 110 08/18/2015 0946   HDL 68 06/09/2021 0851   HDL 78 09/28/2016 0900   HDL 74 08/18/2015 0946   CHOLHDL 2.4 06/09/2021 0851   CHOLHDL 2.6 09/28/2016 0900   CHOLHDL 3 06/18/2014 0852   VLDL 20.4 06/18/2014 0852   LDLCALC 65 06/09/2021 0851   LDLCALC 97 09/28/2016 0900   LDLCALC 62 08/18/2015 0946    Physical Exam:    VS:  BP 102/72   Pulse 60   Ht '5\' 3"'  (1.6 m)   Wt 107 lb 12.8 oz (48.9 kg)   SpO2 99%   BMI 19.10 kg/m     Wt Readings from Last 3 Encounters:  06/20/22 107 lb 12.8 oz (48.9 kg)  04/08/22 110 lb (49.9 kg)  10/12/21 110 lb 8 oz (50.1 kg)     GEN:  Well nourished, well developed in no acute distress HEENT: Normal NECK: No JVD; No carotid bruits CARDIAC: RRR, no murmurs RESPIRATORY:  Clear to auscultation without rales, wheezing or rhonchi  ABDOMEN: Soft, non-tender, non-distended MUSCULOSKELETAL:  No edema; No deformity  SKIN: Warm and dry NEUROLOGIC:  Alert and oriented x 3 PSYCHIATRIC:  Normal affect   ASSESSMENT:    1. Coronary artery disease involving native coronary artery of native heart without angina pectoris   2. Essential hypertension   3. Mixed hyperlipidemia    PLAN:    In order of problems listed above:  #Non-obstructive CAD: Coronary CTA  2019 with mild non obstructive CAD in the proximal and mid LAD and mid RCA. FFR was normal. Patient is very active and exercises without issues.  -Continue crestor 46m daily -Continue lifestyle  modifications  #HTN: On the softer side in low 100s today. Will decrease losartan to 12.5644mdaily and monitor. -Decrease losartan to 12.44m17maily  #HLD: -Continue crestor 46m44mily -Repeat lipids today -Goal LDL<70 -Continue lifestyle modificaitons  Exercise recommendations: Goal of exercising for at least 30 minutes a day, at least 5 times per week.  Please exercise to a moderate exertion.  This means that while exercising it is difficult to speak in full sentences, however you are not so short of breath that you feel you must stop, and not so comfortable that you can carry on a full conversation.  Exertion level should be approximately a 5/10, if 10 is the most exertion you can perform.  Diet recommendations: Recommend a heart healthy diet such as the Mediterranean diet.  This diet consists of plant based foods, healthy fats, lean meats, olive oil.  It suggests limiting the intake of simple carbohydrates such as white breads, pastries, and pastas.  It also limits the amount of red meat, wine, and dairy products such as cheese that one should consume on a daily basis.   Follow up in 1 year  Medication Adjustments/Labs and Tests Ordered: Current medicines are reviewed at length with the patient today.  Concerns regarding medicines are outlined above.  Orders Placed This Encounter  Procedures   Lipid panel   EKG 12-Lead   Meds ordered this encounter  Medications   losartan (COZAAR) 25 MG tablet    Sig: Take 0.5 tablets (12.5 mg total) by mouth daily. Please keep upcominig appt with Dr. PembJohney FrameJuly 2023 before anymore refills. Thank you Final attempt    Dispense:  45 tablet    Refill:  3    Pt must keep upcoming appointment with Dr. PembJohney FrameJuly 2023 before anymore refills. Thank you Final attempt    Patient Instructions  Medication Instructions:  Your physician has recommended you make the following change in your medication:  1-Losartan 12.5 mg by mouth  daily.  *If you need a refill on your cardiac medications before your next appointment, please call your pharmacy*   Lab Work: Your physician recommends that you have lab work today- lipid panel  If you have labs (blood work) drawn today and your tests are completely normal, you will receive your results only by: MyChart Message (if you have MyChart) OR A paper copy in the mail If you have any lab test that is abnormal or we need to change your treatment, we will call you to review the results.  Follow-Up: [facility] and your health needs are our priority.  As part of our continuing mission to provide you with exceptional heart care, we have created designated Provider Care Teams.  These Care Teams include your primary Cardiologist (physician) and Advanced Practice Providers (APPs -  Physician Assistants and Nurse Practitioners) who all work together to provide you with the care you need, when you need it.  We recommend signing up for the patient portal called "MyChart".  Sign up information is provided on this After Visit Summary.  MyChart is used to connect with patients for Virtual Visits (Telemedicine).  Patients are able to view lab/test results, encounter notes, upcoming appointments, etc.  Non-urgent messages can be sent to your provider as well.   To learn  more about what you can do with MyChart, go to [email or web address].    Your next appointment:   1 year(s)  The format for your next appointment:   In Person  Provider:   Freada Bergeron, MD {   Important Information About Sugar           I,Tinashe Williams,acting as a scribe for Freada Bergeron, MD.,have documented all relevant documentation on the behalf of Freada Bergeron, MD,as directed by  Freada Bergeron, MD while in the presence of Freada Bergeron, MD.   I, Freada Bergeron, MD, have reviewed all documentation for this visit. The documentation on 06/20/22 for the exam,  diagnosis, procedures, and orders are all accurate and complete.

## 2022-06-20 NOTE — Patient Instructions (Signed)
Medication Instructions:  Your physician has recommended you make the following change in your medication:  1-Losartan 12.5 mg by mouth daily.  *If you need a refill on your cardiac medications before your next appointment, please call your pharmacy*   Lab Work: Your physician recommends that you have lab work today- lipid panel  If you have labs (blood work) drawn today and your tests are completely normal, you will receive your results only by: MyChart Message (if you have MyChart) OR A paper copy in the mail If you have any lab test that is abnormal or we need to change your treatment, we will call you to review the results.  Follow-Up: At [facility], you and your health needs are our priority.  As part of our continuing mission to provide you with exceptional heart care, we have created designated Provider Care Teams.  These Care Teams include your primary Cardiologist (physician) and Advanced Practice Providers (APPs -  Physician Assistants and Nurse Practitioners) who all work together to provide you with the care you need, when you need it.  We recommend signing up for the patient portal called "MyChart".  Sign up information is provided on this After Visit Summary.  MyChart is used to connect with patients for Virtual Visits (Telemedicine).  Patients are able to view lab/test results, encounter notes, upcoming appointments, etc.  Non-urgent messages can be sent to your provider as well.   To learn more about what you can do with MyChart, go to [email or web address].    Your next appointment:   1 year(s)  The format for your next appointment:   In Person  Provider:   Freada Bergeron, MD {   Important Information About Sugar

## 2022-06-21 ENCOUNTER — Telehealth: Payer: Self-pay | Admitting: *Deleted

## 2022-06-21 DIAGNOSIS — E782 Mixed hyperlipidemia: Secondary | ICD-10-CM

## 2022-06-21 DIAGNOSIS — Z79899 Other long term (current) drug therapy: Secondary | ICD-10-CM

## 2022-06-21 DIAGNOSIS — I2510 Atherosclerotic heart disease of native coronary artery without angina pectoris: Secondary | ICD-10-CM

## 2022-06-21 MED ORDER — EZETIMIBE 10 MG PO TABS: 10.0000 mg | ORAL_TABLET | Freq: Every day | ORAL | 2 refills | Status: DC

## 2022-06-21 NOTE — Telephone Encounter (Signed)
-----   Message from Freada Bergeron, MD sent at 06/20/2022  9:24 PM EDT ----- Her LDL cholesterol is slightly above goal at 92. Has she been taking the crestor everyday? If so, can we add zetia '10mg'$  daily and repeat lipids in 8 weeks to ensure improving?

## 2022-06-21 NOTE — Telephone Encounter (Signed)
The patient has been notified of the result and verbalized understanding.  All questions (if any) were answered.  Pt confirmed that she is taking her statin everyday.  She is aware to start taking new med zetia 10 mg po daily and come in for repeat lipids in 8 weeks. Confirmed the pharmacy of choice with the pt.  Pt will come in for repeat lipids in 8 weeks on 08/16/22. She is aware to come fasting.  Pt verbalized understanding and agrees with this plan.

## 2022-06-24 DIAGNOSIS — M79602 Pain in left arm: Secondary | ICD-10-CM | POA: Diagnosis not present

## 2022-06-24 DIAGNOSIS — R29898 Other symptoms and signs involving the musculoskeletal system: Secondary | ICD-10-CM | POA: Diagnosis not present

## 2022-06-24 DIAGNOSIS — M542 Cervicalgia: Secondary | ICD-10-CM | POA: Diagnosis not present

## 2022-06-24 DIAGNOSIS — M25511 Pain in right shoulder: Secondary | ICD-10-CM | POA: Diagnosis not present

## 2022-06-24 DIAGNOSIS — M778 Other enthesopathies, not elsewhere classified: Secondary | ICD-10-CM | POA: Diagnosis not present

## 2022-06-24 DIAGNOSIS — M545 Low back pain, unspecified: Secondary | ICD-10-CM | POA: Diagnosis not present

## 2022-06-24 DIAGNOSIS — M1812 Unilateral primary osteoarthritis of first carpometacarpal joint, left hand: Secondary | ICD-10-CM | POA: Diagnosis not present

## 2022-06-27 DIAGNOSIS — R29898 Other symptoms and signs involving the musculoskeletal system: Secondary | ICD-10-CM | POA: Diagnosis not present

## 2022-06-27 DIAGNOSIS — M79602 Pain in left arm: Secondary | ICD-10-CM | POA: Diagnosis not present

## 2022-06-27 DIAGNOSIS — M1812 Unilateral primary osteoarthritis of first carpometacarpal joint, left hand: Secondary | ICD-10-CM | POA: Diagnosis not present

## 2022-06-27 DIAGNOSIS — M778 Other enthesopathies, not elsewhere classified: Secondary | ICD-10-CM | POA: Diagnosis not present

## 2022-06-30 DIAGNOSIS — M1812 Unilateral primary osteoarthritis of first carpometacarpal joint, left hand: Secondary | ICD-10-CM | POA: Diagnosis not present

## 2022-06-30 DIAGNOSIS — J301 Allergic rhinitis due to pollen: Secondary | ICD-10-CM | POA: Diagnosis not present

## 2022-06-30 DIAGNOSIS — M778 Other enthesopathies, not elsewhere classified: Secondary | ICD-10-CM | POA: Diagnosis not present

## 2022-06-30 DIAGNOSIS — R29898 Other symptoms and signs involving the musculoskeletal system: Secondary | ICD-10-CM | POA: Diagnosis not present

## 2022-06-30 DIAGNOSIS — M79602 Pain in left arm: Secondary | ICD-10-CM | POA: Diagnosis not present

## 2022-06-30 DIAGNOSIS — J3089 Other allergic rhinitis: Secondary | ICD-10-CM | POA: Diagnosis not present

## 2022-06-30 DIAGNOSIS — J3081 Allergic rhinitis due to animal (cat) (dog) hair and dander: Secondary | ICD-10-CM | POA: Diagnosis not present

## 2022-07-10 NOTE — Progress Notes (Deleted)
Cardiology Office Note:    Date:  07/10/2022   ID:  Madison Kramer, DOB 1946-02-06, MRN [id number]  PCP:  [name], MD   [place]  Cardiologist:  Freada Bergeron, MD  Advanced Practice Provider:  No care team member to display Electrophysiologist:  None   Referring MD: [name], MD     History of Present Illness:    Madison Kramer is a 76 y.o. 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 Meda Kramer now returning to clinic for follow-up.  Last saw Dr. Meda Kramer in 02/03/20 where she was doing well. Her LDL was elevated at 112 and her pravastatin was increased to 35m daily later changed to crestor 157mdaily.   During the last visit in 02/18/2021, the patient was doing well. Remained very active.  Today, the patient feels well. Remains very active and exercises daily with no exertional symptoms. Blood pressure is on the softer side in low 100s. No dizziness, lightheadedness, syncope. Tolerating medications without issues.   The patient denies chest pain, nocturnal dyspnea, orthopnea or peripheral edema. There have been no palpitations, lightheadedness or syncope. Complains of shortness of breath.   Past Medical History:  Diagnosis Date   Allergic rhinitis    Anxiety    Arthritis    Asthma    Atypical chest pain    Possibly from DES   Back pain    Cataract    Hx; of   Diverticulosis    Left   Elevated cholesterol    GERD (gastroesophageal reflux disease)    Hemoptysis 2007   From reflux pharyngitis   History of nuclear stress test    Myoview 5/17: EF 70%, no scar or ischemia, excellent exercise capacity, low risk   Hypertension    Insomnia    Lactose intolerance    Lichen sclerosus et atrophicus of the vulva 2008   Biopsy-proven   Nocturia    Osteoporosis 10/2015   T score -2.5 left femoral neck   Pneumonia    Raynaud's syndrome     "possible"    Past Surgical History:  Procedure Laterality Date   BREAST SURGERY  2001   REDUCTION MAMMAPLASTY   COLONOSCOPY     FOOT SURGERY  2009   HYSTEROSCOPY  2003   HYSTEROSCOPIC RESECTION OF MYOMA   INGUINAL HERNIA REPAIR  1971   BILATERAL   LUMBAR LAMINECTOMY/DECOMPRESSION MICRODISCECTOMY Right 08/30/2013   Procedure: Right Lumbar four-five Extraforaminal Diskectomy;  Surgeon: RoHosie SpangleMD;  [facility];  Service: Neurosurgery;  Laterality: Right;   MANDIBLE SURGERY     MYOMECTOMY     TONSILLECTOMY     TUBAL LIGATION  1987    Current Medications: No outpatient medications have been marked as taking for the 07/15/22 encounter (Appointment) with PeFreada BergeronMD.     Allergies:   Atorvastatin, Codeine, Erythromycin, Macrobid [nitrofurantoin], Sulfa antibiotics, Sulfamethoxazole, Sulfonamide derivatives, Tetracyclines & related, and Tramadol   Social History   Socioeconomic History   Marital status: Divorced    Spouse name: Not on file   Number of children: 2   Years of education: Not on file   Highest education level: Not on file  Occupational History   Occupation: REaltor    Employer: [employer]  Tobacco Use   Smoking status: Former    Years: 20.00    Types: Cigarettes  Quit date: 12/12/1988    Years since quitting: 33.5   Smokeless tobacco: Never  Vaping Use   Vaping Use: Never used  Substance and Sexual Activity   Alcohol use: Yes    Alcohol/week: 7.0 standard drinks of alcohol    Types: 7 Standard drinks or equivalent per week    Comment: 7 glasses   Drug use: No   Sexual activity: Not Currently    Birth control/protection: Post-menopausal    Comment: 1st intercourse 76 yo-Fewer than 5 partners  Other Topics Concern   Not on file  Social History Narrative   Not on file   Social Determinants of Health   Financial Resource Strain: Not on file  Food Insecurity: Not on file  Transportation Needs: Not on file  Physical  Activity: Not on file  Stress: Not on file  Social Connections: Not on file     Family History: The patient's family history includes Breast cancer in her maternal aunt; Diabetes in her mother; Heart disease in her maternal uncle, mother, and paternal grandfather; Hypertension in her mother; Multiple myeloma in her father.  ROS:   Please see the history of present illness. (+) Shortness of Breath All other systems are reviewed and negative.     EKGs/Labs/Other Studies Reviewed:    The following studies were reviewed today: Coronary CT 11/2018 Coronary Arteries:  Normal coronary origin.  Right dominance.   RCA is a large dominant artery that gives rise to PDA and PLVB. There is mild calcified plaque in the mid RCA with associated stenosis 25-50%.   Left main is a large artery that gives rise to LAD and LCX arteries.   LAD is a large vessel that gives rise to one diagonal artery. There is a moderate focal calcified plaque in the proximal LAD with stenosis 25-50%, but possibly 50-69%. Mid LAD has a mild calcified plaque with stenosis 25-50%.   D1 has no significant plaque.   LCX is a non-dominant artery that gives rise to one OM1 branch. There is no plaque.   Other findings:   Normal pulmonary vein drainage into the left atrium.   Normal let atrial appendage without a thrombus.   Normal size of the pulmonary artery.   IMPRESSION: 1. Coronary calcium score of 255. This was 7 percentile for age and sex matched control.   2. Normal coronary origin with right dominance.   3. Mild CAD in the proximal and mid LAD and mid RCA. Additional analysis with CT FFR are recommended.   1. Left Main:  No significant stenosis.   2. LAD: No significant stenosis. 3. LCX: No significant stenosis. 4. RCA: No significant stenosis.   IMPRESSION:   CT FFR analysis didn't show any significant stenosis.     Myoview Study Highlights 04/2016   Nuclear stress EF: 70%. The study is  normal. This is a low risk study. The left ventricular ejection fraction is hyperdynamic (>65%). There was no ST segment deviation noted during stress.   Normal exercise nuclear stress test with no evidence of prior infarct or ischemia. Normal BP response to stress. Excellent exercise capacity.      Echo Study Conclusions 2014  - Left ventricle: The cavity size was normal. Wall thickness    was normal. Systolic function was normal. The estimated    ejection fraction was in the range of 55% to 60%. Wall    motion was normal; there were no regional wall motion    abnormalities. Doppler parameters are consistent with  abnormal left ventricular relaxation (grade 1 diastolic    dysfunction).  - Mitral valve: Valve area by pressure half-time: 1.73cm^2.  - Atrial septum: No defect or patent foramen ovale was    identified.  Impressions:   - No significant abnormalities for age.     EKG:  EKG is personally reviewed.  06/20/2022 EKG: NSR with HR 62  02/18/2021 EKG: The ekg ordered today demonstrates sinus bradycardia HR 55  Recent Labs: No results found for requested labs within last 365 days.  Recent Lipid Panel    Component Value Date/Time   CHOL 177 06/20/2022 1124   CHOL 200 (H) 09/28/2016 0900   CHOL 158 08/18/2015 0946   TRIG 95 06/20/2022 1124   TRIG 149 09/28/2016 0900   TRIG 110 08/18/2015 0946   HDL 68 06/20/2022 1124   HDL 78 09/28/2016 0900   HDL 74 08/18/2015 0946   CHOLHDL 2.6 06/20/2022 1124   CHOLHDL 2.6 09/28/2016 0900   CHOLHDL 3 06/18/2014 0852   VLDL 20.4 06/18/2014 0852   LDLCALC 92 06/20/2022 1124   LDLCALC 97 09/28/2016 0900   LDLCALC 62 08/18/2015 0946    Physical Exam:    VS:  There were no vitals taken for this visit.    Wt Readings from Last 3 Encounters:  06/20/22 107 lb 12.8 oz (48.9 kg)  04/08/22 110 lb (49.9 kg)  10/12/21 110 lb 8 oz (50.1 kg)     GEN:  Well nourished, well developed in no acute distress HEENT: Normal NECK: No  JVD; No carotid bruits CARDIAC: RRR, no murmurs RESPIRATORY:  Clear to auscultation without rales, wheezing or rhonchi  ABDOMEN: Soft, non-tender, non-distended MUSCULOSKELETAL:  No edema; No deformity  SKIN: Warm and dry NEUROLOGIC:  Alert and oriented x 3 PSYCHIATRIC:  Normal affect   ASSESSMENT:    No diagnosis found.  PLAN:    In order of problems listed above:  #Non-obstructive CAD: Coronary CTA 2019 with mild non obstructive CAD in the proximal and mid LAD and mid RCA. FFR was normal. Patient is very active and exercises without issues.  -Continue crestor 83m daily -Continue lifestyle modifications  #HTN: On the softer side in low 100s today. Will decrease losartan to 12.530mdaily and monitor. -Decrease losartan to 12.81m17maily  #HLD: -Continue crestor 6m96mily -Repeat lipids today -Goal LDL<70 -Continue lifestyle modificaitons  Exercise recommendations: Goal of exercising for at least 30 minutes a day, at least 5 times per week.  Please exercise to a moderate exertion.  This means that while exercising it is difficult to speak in full sentences, however you are not so short of breath that you feel you must stop, and not so comfortable that you can carry on a full conversation.  Exertion level should be approximately a 5/10, if 10 is the most exertion you can perform.  Diet recommendations: Recommend a heart healthy diet such as the Mediterranean diet.  This diet consists of plant based foods, healthy fats, lean meats, olive oil.  It suggests limiting the intake of simple carbohydrates such as white breads, pastries, and pastas.  It also limits the amount of red meat, wine, and dairy products such as cheese that one should consume on a daily basis.   Follow up in 1 year  Medication Adjustments/Labs and Tests Ordered: Current medicines are reviewed at length with the patient today.  Concerns regarding medicines are outlined above.  No orders of the defined types were  placed in this encounter.  No orders  of the defined types were placed in this encounter.   There are no Patient Instructions on file for this visit.

## 2022-07-15 ENCOUNTER — Ambulatory Visit: Payer: Medicare Other | Admitting: Cardiology

## 2022-07-18 ENCOUNTER — Other Ambulatory Visit: Payer: Self-pay | Admitting: Allergy and Immunology

## 2022-07-18 ENCOUNTER — Ambulatory Visit
Admission: RE | Admit: 2022-07-18 | Discharge: 2022-07-18 | Disposition: A | Discharge status: Home / Self Care | Payer: Medicare Other | Source: Ambulatory Visit | Attending: Allergy and Immunology | Admitting: Allergy and Immunology

## 2022-07-18 DIAGNOSIS — R059 Cough, unspecified: Secondary | ICD-10-CM | POA: Diagnosis not present

## 2022-07-18 DIAGNOSIS — J4530 Mild persistent asthma, uncomplicated: Secondary | ICD-10-CM | POA: Diagnosis not present

## 2022-07-18 DIAGNOSIS — J209 Acute bronchitis, unspecified: Secondary | ICD-10-CM

## 2022-07-18 DIAGNOSIS — J3089 Other allergic rhinitis: Secondary | ICD-10-CM | POA: Diagnosis not present

## 2022-07-18 DIAGNOSIS — K219 Gastro-esophageal reflux disease without esophagitis: Secondary | ICD-10-CM | POA: Diagnosis not present

## 2022-07-18 DIAGNOSIS — H1045 Other chronic allergic conjunctivitis: Secondary | ICD-10-CM | POA: Diagnosis not present

## 2022-07-21 ENCOUNTER — Telehealth: Payer: Self-pay | Admitting: Internal Medicine

## 2022-07-21 NOTE — Telephone Encounter (Signed)
Inbound call from patient stating she was watching the news and a message came across the screen regarding Omeprazole medication she is taking.Please give patient a call back to further advise.  Thank you

## 2022-07-21 NOTE — Telephone Encounter (Signed)
Spoke to patient and told her that Dr. Henrene Pastor said there are no studies that he has read that indicate there is a connection with dementia.  Patient understood and thanked me.

## 2022-07-26 DIAGNOSIS — M542 Cervicalgia: Secondary | ICD-10-CM | POA: Diagnosis not present

## 2022-07-26 DIAGNOSIS — M545 Low back pain, unspecified: Secondary | ICD-10-CM | POA: Diagnosis not present

## 2022-07-26 DIAGNOSIS — M25511 Pain in right shoulder: Secondary | ICD-10-CM | POA: Diagnosis not present

## 2022-08-03 DIAGNOSIS — J301 Allergic rhinitis due to pollen: Secondary | ICD-10-CM | POA: Diagnosis not present

## 2022-08-03 DIAGNOSIS — J3089 Other allergic rhinitis: Secondary | ICD-10-CM | POA: Diagnosis not present

## 2022-08-03 DIAGNOSIS — J3081 Allergic rhinitis due to animal (cat) (dog) hair and dander: Secondary | ICD-10-CM | POA: Diagnosis not present

## 2022-08-08 ENCOUNTER — Ambulatory Visit
Admission: RE | Admit: 2022-08-08 | Discharge: 2022-08-08 | Disposition: A | Discharge status: Home / Self Care | Payer: Medicare Other | Source: Ambulatory Visit | Attending: Obstetrics & Gynecology | Admitting: Obstetrics & Gynecology

## 2022-08-08 DIAGNOSIS — Z780 Asymptomatic menopausal state: Secondary | ICD-10-CM | POA: Diagnosis not present

## 2022-08-08 DIAGNOSIS — M810 Age-related osteoporosis without current pathological fracture: Secondary | ICD-10-CM

## 2022-08-10 DIAGNOSIS — M542 Cervicalgia: Secondary | ICD-10-CM | POA: Diagnosis not present

## 2022-08-10 DIAGNOSIS — M545 Low back pain, unspecified: Secondary | ICD-10-CM | POA: Diagnosis not present

## 2022-08-10 DIAGNOSIS — M25511 Pain in right shoulder: Secondary | ICD-10-CM | POA: Diagnosis not present

## 2022-08-16 ENCOUNTER — Ambulatory Visit: Payer: Medicare Other | Attending: Cardiology

## 2022-08-16 DIAGNOSIS — Z79899 Other long term (current) drug therapy: Secondary | ICD-10-CM | POA: Insufficient documentation

## 2022-08-16 DIAGNOSIS — E782 Mixed hyperlipidemia: Secondary | ICD-10-CM | POA: Insufficient documentation

## 2022-08-16 DIAGNOSIS — I2510 Atherosclerotic heart disease of native coronary artery without angina pectoris: Secondary | ICD-10-CM | POA: Insufficient documentation

## 2022-08-16 LAB — LIPID PANEL
Chol/HDL Ratio: 2 ratio (ref 0.0–4.4)
Cholesterol, Total: 164 mg/dL (ref 100–199)
HDL: 83 mg/dL (ref >39)
LDL Chol Calc (NIH): 57 mg/dL (ref 0–99)
Triglycerides: 147 mg/dL (ref 0–149)
VLDL Cholesterol Cal: 24 mg/dL (ref 5–40)

## 2022-08-17 ENCOUNTER — Telehealth (HOSPITAL_BASED_OUTPATIENT_CLINIC_OR_DEPARTMENT_OTHER): Payer: Self-pay | Admitting: Obstetrics & Gynecology

## 2022-08-17 NOTE — Telephone Encounter (Signed)
Patient called and left a message for someone to please call her to go over her results.

## 2022-08-18 NOTE — Telephone Encounter (Signed)
Pt called regarding results of bone density testing. Advised that testing showed worsening osteoporosis. Pt desires appt with provider to discuss treatment options. Appt provided

## 2022-09-01 DIAGNOSIS — Z23 Encounter for immunization: Secondary | ICD-10-CM | POA: Diagnosis not present

## 2022-09-06 DIAGNOSIS — M542 Cervicalgia: Secondary | ICD-10-CM | POA: Diagnosis not present

## 2022-09-06 DIAGNOSIS — M25511 Pain in right shoulder: Secondary | ICD-10-CM | POA: Diagnosis not present

## 2022-09-06 DIAGNOSIS — M545 Low back pain, unspecified: Secondary | ICD-10-CM | POA: Diagnosis not present

## 2022-09-09 DIAGNOSIS — J3089 Other allergic rhinitis: Secondary | ICD-10-CM | POA: Diagnosis not present

## 2022-09-12 DIAGNOSIS — J3089 Other allergic rhinitis: Secondary | ICD-10-CM | POA: Diagnosis not present

## 2022-09-13 DIAGNOSIS — I10 Essential (primary) hypertension: Secondary | ICD-10-CM | POA: Diagnosis not present

## 2022-09-13 DIAGNOSIS — M810 Age-related osteoporosis without current pathological fracture: Secondary | ICD-10-CM | POA: Diagnosis not present

## 2022-09-13 DIAGNOSIS — J309 Allergic rhinitis, unspecified: Secondary | ICD-10-CM | POA: Diagnosis not present

## 2022-09-13 DIAGNOSIS — F418 Other specified anxiety disorders: Secondary | ICD-10-CM | POA: Diagnosis not present

## 2022-09-13 DIAGNOSIS — N3281 Overactive bladder: Secondary | ICD-10-CM | POA: Diagnosis not present

## 2022-09-13 DIAGNOSIS — E7800 Pure hypercholesterolemia, unspecified: Secondary | ICD-10-CM | POA: Diagnosis not present

## 2022-09-13 DIAGNOSIS — E559 Vitamin D deficiency, unspecified: Secondary | ICD-10-CM | POA: Diagnosis not present

## 2022-09-13 DIAGNOSIS — K219 Gastro-esophageal reflux disease without esophagitis: Secondary | ICD-10-CM | POA: Diagnosis not present

## 2022-09-13 DIAGNOSIS — Z23 Encounter for immunization: Secondary | ICD-10-CM | POA: Diagnosis not present

## 2022-09-19 ENCOUNTER — Encounter (HOSPITAL_BASED_OUTPATIENT_CLINIC_OR_DEPARTMENT_OTHER): Payer: Self-pay | Admitting: Obstetrics & Gynecology

## 2022-09-19 ENCOUNTER — Ambulatory Visit (INDEPENDENT_AMBULATORY_CARE_PROVIDER_SITE_OTHER): Payer: Medicare Other | Admitting: Obstetrics & Gynecology

## 2022-09-19 VITALS — BP 130/68 | HR 62 | Ht 63.5 in | Wt 109.6 lb

## 2022-09-19 DIAGNOSIS — I2510 Atherosclerotic heart disease of native coronary artery without angina pectoris: Secondary | ICD-10-CM | POA: Diagnosis not present

## 2022-09-19 DIAGNOSIS — M810 Age-related osteoporosis without current pathological fracture: Secondary | ICD-10-CM

## 2022-09-19 DIAGNOSIS — M816 Localized osteoporosis [Lequesne]: Secondary | ICD-10-CM | POA: Diagnosis not present

## 2022-09-19 DIAGNOSIS — M85852 Other specified disorders of bone density and structure, left thigh: Secondary | ICD-10-CM

## 2022-09-19 DIAGNOSIS — M85851 Other specified disorders of bone density and structure, right thigh: Secondary | ICD-10-CM | POA: Diagnosis not present

## 2022-09-19 DIAGNOSIS — J3089 Other allergic rhinitis: Secondary | ICD-10-CM | POA: Diagnosis not present

## 2022-09-19 NOTE — Progress Notes (Unsigned)
GYNECOLOGY  VISIT  CC:   discuss BMD  HPI: 76 y.o. G52P2002 Divorced White or Caucasian female here for discussion of BMD obtained 08/08/2022 showing osteoporosis with t score of -3.1.  this has decrease since 08/06/2020 when BMD showed t score of -2.5.  Reviewed findings with pt.  She admits she does not want to be on medication so would like suggestions for non-medical treatment.  Testing for secondary causes recommended.  Will obtain these labs today.  Pt and I discussed calcium and Vit D intake.  Taking about '1000mg'$  calcium but she doesn't get a lot of dairy in diet.  Will do a 1 week assessment and may need to increase to 1200-'1500mg'$ .    Weight bearing exercise, physical training, possibly Osteo-strong treatment as well discussed.    Lastly, we discussed repeating BMD in 2 years.  Pt may want this done in one year.  Aware will probably be out of pocket cost but she may be willing to cover this.   Past Medical History:  Diagnosis Date   Allergic rhinitis    Anxiety    Arthritis    Asthma    Atypical chest pain    Possibly from DES   Back pain    Cataract    Hx; of   Diverticulosis    Left   Elevated cholesterol    GERD (gastroesophageal reflux disease)    Hemoptysis 2007   From reflux pharyngitis   History of nuclear stress test    Myoview 5/17: EF 70%, no scar or ischemia, excellent exercise capacity, low risk   Hypertension    Insomnia    Lactose intolerance    Lichen sclerosus et atrophicus of the vulva 2008   Biopsy-proven   Nocturia    Osteoporosis 10/2015   T score -2.5 left femoral neck   Pneumonia    Raynaud's syndrome    "possible"    MEDS:   Current Outpatient Medications on File Prior to Visit  Medication Sig Dispense Refill   Ascorbic Acid (VITAMIN C) 1000 MG tablet Take 3,000 mg by mouth daily.     Calcium Carbonate-Vitamin D (CALCIUM + D PO) Take 1,200 mg by mouth.     Cholecalciferol (VITAMIN D PO) Take 2,000 Units by mouth daily.      EPINEPHrine  0.3 mg/0.3 mL IJ SOAJ injection USE AS DIRECTED AS NEEDED SYSTEMIC REACTION     ezetimibe (ZETIA) 10 MG tablet Take 1 tablet (10 mg total) by mouth daily. 90 tablet 2   fluticasone (FLONASE) 50 MCG/ACT nasal spray Place 1 spray into both nostrils as needed.      Glucosamine-Chondroitin-MSM 500-200-150 MG TABS Take 1 tablet by mouth 2 (two) times daily.     losartan (COZAAR) 25 MG tablet Take 0.5 tablets (12.5 mg total) by mouth daily. Please keep upcominig appt with Dr. Johney Frame in July 2023 before anymore refills. Thank you Final attempt 45 tablet 3   MELATONIN PO Take 1 mg by mouth every other day. gummy     meloxicam (MOBIC) 7.5 MG tablet Take 7.5 mg by mouth as needed.      mirabegron ER (MYRBETRIQ) 50 MG TB24 tablet Take 50 mg by mouth daily.     omeprazole (PRILOSEC) 40 MG capsule Take 1 capsule (40 mg total) by mouth daily. 90 capsule 3   Probiotic Product (PROBIOTIC DAILY PO) Take 1 tablet by mouth 2 (two) times daily.      rosuvastatin (CRESTOR) 20 MG tablet Take 1 tablet (20  mg total) by mouth daily. 90 tablet 0   sertraline (ZOLOFT) 50 MG tablet Take 50 mg by mouth daily.     TURMERIC PO Take 1 tablet by mouth 2 (two) times daily.      No current facility-administered medications on file prior to visit.    ALLERGIES: Atorvastatin, Codeine, Erythromycin, Macrobid [nitrofurantoin], Sulfa antibiotics, Sulfamethoxazole, Sulfonamide derivatives, Tetracyclines & related, and Tramadol  SH:  divorced, non smoker  Review of Systems  Constitutional: Negative.     PHYSICAL EXAMINATION:    BP 130/68 (BP Location: Right Arm, Patient Position: Sitting, Cuff Size: Normal)   Pulse 62   Ht 5' 3.5" (1.613 m) Comment: Reported  Wt 109 lb 9.6 oz (49.7 kg)   BMI 19.11 kg/m     General appearance: alert, cooperative and appears stated age No other exam performed   Assessment/Plan: 1. Osteopenia of necks of both femurs - will test for secondary causes - Comprehensive metabolic panel -  TSH - VITAMIN D 25 Hydroxy (Vit-D Deficiency, Fractures) - PTH, intact (no Ca) - referral to [facility] as well

## 2022-09-20 LAB — COMPREHENSIVE METABOLIC PANEL
ALT: 24 IU/L (ref 0–32)
AST: 25 IU/L (ref 0–40)
Albumin/Globulin Ratio: 2 (ref 1.2–2.2)
Albumin: 4.7 g/dL (ref 3.8–4.8)
Alkaline Phosphatase: 58 IU/L (ref 44–121)
BUN/Creatinine Ratio: 29 — ABNORMAL HIGH (ref 12–28)
BUN: 21 mg/dL (ref 8–27)
Bilirubin Total: 0.3 mg/dL (ref 0.0–1.2)
CO2: 22 mmol/L (ref 20–29)
Calcium: 9.4 mg/dL (ref 8.7–10.3)
Chloride: 103 mmol/L (ref 96–106)
Creatinine, Ser: 0.73 mg/dL (ref 0.57–1.00)
Globulin, Total: 2.3 g/dL (ref 1.5–4.5)
Glucose: 109 mg/dL — ABNORMAL HIGH (ref 70–99)
Potassium: 4.7 mmol/L (ref 3.5–5.2)
Sodium: 141 mmol/L (ref 134–144)
Total Protein: 7 g/dL (ref 6.0–8.5)
eGFR: 86 mL/min/{1.73_m2} (ref >59)

## 2022-09-20 LAB — TSH: TSH: 1.76 u[IU]/mL (ref 0.450–4.500)

## 2022-09-20 LAB — VITAMIN D 25 HYDROXY (VIT D DEFICIENCY, FRACTURES): Vit D, 25-Hydroxy: 42.5 ng/mL (ref 30.0–100.0)

## 2022-09-20 LAB — PARATHYROID HORMONE, INTACT (NO CA): PTH: 25 pg/mL (ref 15–65)

## 2022-09-21 DIAGNOSIS — M85852 Other specified disorders of bone density and structure, left thigh: Secondary | ICD-10-CM | POA: Insufficient documentation

## 2022-09-21 DIAGNOSIS — M85851 Other specified disorders of bone density and structure, right thigh: Secondary | ICD-10-CM | POA: Insufficient documentation

## 2022-09-23 DIAGNOSIS — Z23 Encounter for immunization: Secondary | ICD-10-CM | POA: Diagnosis not present

## 2022-09-24 ENCOUNTER — Encounter (HOSPITAL_BASED_OUTPATIENT_CLINIC_OR_DEPARTMENT_OTHER): Payer: Self-pay | Admitting: Obstetrics & Gynecology

## 2022-09-26 DIAGNOSIS — J3089 Other allergic rhinitis: Secondary | ICD-10-CM | POA: Diagnosis not present

## 2022-09-26 DIAGNOSIS — J3081 Allergic rhinitis due to animal (cat) (dog) hair and dander: Secondary | ICD-10-CM | POA: Diagnosis not present

## 2022-09-26 DIAGNOSIS — J301 Allergic rhinitis due to pollen: Secondary | ICD-10-CM | POA: Diagnosis not present

## 2022-09-27 DIAGNOSIS — M545 Low back pain, unspecified: Secondary | ICD-10-CM | POA: Diagnosis not present

## 2022-09-27 DIAGNOSIS — M542 Cervicalgia: Secondary | ICD-10-CM | POA: Diagnosis not present

## 2022-09-27 DIAGNOSIS — M25511 Pain in right shoulder: Secondary | ICD-10-CM | POA: Diagnosis not present

## 2022-10-03 ENCOUNTER — Encounter: Payer: Self-pay | Admitting: *Deleted

## 2022-10-03 DIAGNOSIS — J3089 Other allergic rhinitis: Secondary | ICD-10-CM | POA: Diagnosis not present

## 2022-10-03 DIAGNOSIS — Z1231 Encounter for screening mammogram for malignant neoplasm of breast: Secondary | ICD-10-CM | POA: Diagnosis not present

## 2022-10-03 DIAGNOSIS — J3081 Allergic rhinitis due to animal (cat) (dog) hair and dander: Secondary | ICD-10-CM | POA: Diagnosis not present

## 2022-10-03 DIAGNOSIS — J301 Allergic rhinitis due to pollen: Secondary | ICD-10-CM | POA: Diagnosis not present

## 2022-10-10 DIAGNOSIS — J3081 Allergic rhinitis due to animal (cat) (dog) hair and dander: Secondary | ICD-10-CM | POA: Diagnosis not present

## 2022-10-10 DIAGNOSIS — J3089 Other allergic rhinitis: Secondary | ICD-10-CM | POA: Diagnosis not present

## 2022-10-10 DIAGNOSIS — H524 Presbyopia: Secondary | ICD-10-CM | POA: Diagnosis not present

## 2022-10-10 DIAGNOSIS — Z961 Presence of intraocular lens: Secondary | ICD-10-CM | POA: Diagnosis not present

## 2022-10-10 DIAGNOSIS — J301 Allergic rhinitis due to pollen: Secondary | ICD-10-CM | POA: Diagnosis not present

## 2022-10-13 ENCOUNTER — Encounter (HOSPITAL_BASED_OUTPATIENT_CLINIC_OR_DEPARTMENT_OTHER): Payer: Self-pay | Admitting: *Deleted

## 2022-10-19 DIAGNOSIS — M542 Cervicalgia: Secondary | ICD-10-CM | POA: Diagnosis not present

## 2022-10-19 DIAGNOSIS — M545 Low back pain, unspecified: Secondary | ICD-10-CM | POA: Diagnosis not present

## 2022-10-19 DIAGNOSIS — M25511 Pain in right shoulder: Secondary | ICD-10-CM | POA: Diagnosis not present

## 2022-11-09 DIAGNOSIS — J3089 Other allergic rhinitis: Secondary | ICD-10-CM | POA: Diagnosis not present

## 2022-11-09 DIAGNOSIS — K219 Gastro-esophageal reflux disease without esophagitis: Secondary | ICD-10-CM | POA: Diagnosis not present

## 2022-11-09 DIAGNOSIS — J4530 Mild persistent asthma, uncomplicated: Secondary | ICD-10-CM | POA: Diagnosis not present

## 2022-11-09 DIAGNOSIS — H1045 Other chronic allergic conjunctivitis: Secondary | ICD-10-CM | POA: Diagnosis not present

## 2022-11-14 DIAGNOSIS — J301 Allergic rhinitis due to pollen: Secondary | ICD-10-CM | POA: Diagnosis not present

## 2022-11-14 DIAGNOSIS — J3081 Allergic rhinitis due to animal (cat) (dog) hair and dander: Secondary | ICD-10-CM | POA: Diagnosis not present

## 2022-11-14 DIAGNOSIS — J3089 Other allergic rhinitis: Secondary | ICD-10-CM | POA: Diagnosis not present

## 2022-11-16 DIAGNOSIS — M25511 Pain in right shoulder: Secondary | ICD-10-CM | POA: Diagnosis not present

## 2022-11-16 DIAGNOSIS — M545 Low back pain, unspecified: Secondary | ICD-10-CM | POA: Diagnosis not present

## 2022-11-16 DIAGNOSIS — M542 Cervicalgia: Secondary | ICD-10-CM | POA: Diagnosis not present

## 2022-12-08 DIAGNOSIS — J3089 Other allergic rhinitis: Secondary | ICD-10-CM | POA: Diagnosis not present

## 2022-12-15 DIAGNOSIS — M545 Low back pain, unspecified: Secondary | ICD-10-CM | POA: Diagnosis not present

## 2022-12-15 DIAGNOSIS — M25511 Pain in right shoulder: Secondary | ICD-10-CM | POA: Diagnosis not present

## 2022-12-15 DIAGNOSIS — M542 Cervicalgia: Secondary | ICD-10-CM | POA: Diagnosis not present

## 2023-01-02 DIAGNOSIS — J3081 Allergic rhinitis due to animal (cat) (dog) hair and dander: Secondary | ICD-10-CM | POA: Diagnosis not present

## 2023-01-02 DIAGNOSIS — J301 Allergic rhinitis due to pollen: Secondary | ICD-10-CM | POA: Diagnosis not present

## 2023-01-02 DIAGNOSIS — J3089 Other allergic rhinitis: Secondary | ICD-10-CM | POA: Diagnosis not present

## 2023-01-04 DIAGNOSIS — Z20822 Contact with and (suspected) exposure to covid-19: Secondary | ICD-10-CM | POA: Diagnosis not present

## 2023-01-04 DIAGNOSIS — J40 Bronchitis, not specified as acute or chronic: Secondary | ICD-10-CM | POA: Diagnosis not present

## 2023-01-04 DIAGNOSIS — R059 Cough, unspecified: Secondary | ICD-10-CM | POA: Diagnosis not present

## 2023-01-20 DIAGNOSIS — H1045 Other chronic allergic conjunctivitis: Secondary | ICD-10-CM | POA: Diagnosis not present

## 2023-01-20 DIAGNOSIS — K219 Gastro-esophageal reflux disease without esophagitis: Secondary | ICD-10-CM | POA: Diagnosis not present

## 2023-01-20 DIAGNOSIS — J4530 Mild persistent asthma, uncomplicated: Secondary | ICD-10-CM | POA: Diagnosis not present

## 2023-01-20 DIAGNOSIS — J3089 Other allergic rhinitis: Secondary | ICD-10-CM | POA: Diagnosis not present

## 2023-02-07 DIAGNOSIS — J301 Allergic rhinitis due to pollen: Secondary | ICD-10-CM | POA: Diagnosis not present

## 2023-02-07 DIAGNOSIS — J3081 Allergic rhinitis due to animal (cat) (dog) hair and dander: Secondary | ICD-10-CM | POA: Diagnosis not present

## 2023-02-07 DIAGNOSIS — J3089 Other allergic rhinitis: Secondary | ICD-10-CM | POA: Diagnosis not present

## 2023-02-22 DIAGNOSIS — M25511 Pain in right shoulder: Secondary | ICD-10-CM | POA: Diagnosis not present

## 2023-02-22 DIAGNOSIS — M542 Cervicalgia: Secondary | ICD-10-CM | POA: Diagnosis not present

## 2023-02-22 DIAGNOSIS — M545 Low back pain, unspecified: Secondary | ICD-10-CM | POA: Diagnosis not present

## 2023-03-01 DIAGNOSIS — L603 Nail dystrophy: Secondary | ICD-10-CM | POA: Diagnosis not present

## 2023-03-07 DIAGNOSIS — J3089 Other allergic rhinitis: Secondary | ICD-10-CM | POA: Diagnosis not present

## 2023-03-16 DIAGNOSIS — M545 Low back pain, unspecified: Secondary | ICD-10-CM | POA: Diagnosis not present

## 2023-03-16 DIAGNOSIS — M25511 Pain in right shoulder: Secondary | ICD-10-CM | POA: Diagnosis not present

## 2023-03-16 DIAGNOSIS — M542 Cervicalgia: Secondary | ICD-10-CM | POA: Diagnosis not present

## 2023-03-24 DIAGNOSIS — H02883 Meibomian gland dysfunction of right eye, unspecified eyelid: Secondary | ICD-10-CM | POA: Diagnosis not present

## 2023-03-24 DIAGNOSIS — H04123 Dry eye syndrome of bilateral lacrimal glands: Secondary | ICD-10-CM | POA: Diagnosis not present

## 2023-03-24 DIAGNOSIS — H02886 Meibomian gland dysfunction of left eye, unspecified eyelid: Secondary | ICD-10-CM | POA: Diagnosis not present

## 2023-04-13 DIAGNOSIS — J301 Allergic rhinitis due to pollen: Secondary | ICD-10-CM | POA: Diagnosis not present

## 2023-04-13 DIAGNOSIS — J3089 Other allergic rhinitis: Secondary | ICD-10-CM | POA: Diagnosis not present

## 2023-04-13 DIAGNOSIS — J3081 Allergic rhinitis due to animal (cat) (dog) hair and dander: Secondary | ICD-10-CM | POA: Diagnosis not present

## 2023-04-14 DIAGNOSIS — E559 Vitamin D deficiency, unspecified: Secondary | ICD-10-CM | POA: Diagnosis not present

## 2023-04-14 DIAGNOSIS — E7800 Pure hypercholesterolemia, unspecified: Secondary | ICD-10-CM | POA: Diagnosis not present

## 2023-04-14 DIAGNOSIS — K219 Gastro-esophageal reflux disease without esophagitis: Secondary | ICD-10-CM | POA: Diagnosis not present

## 2023-04-14 DIAGNOSIS — I10 Essential (primary) hypertension: Secondary | ICD-10-CM | POA: Diagnosis not present

## 2023-04-21 DIAGNOSIS — I10 Essential (primary) hypertension: Secondary | ICD-10-CM | POA: Diagnosis not present

## 2023-04-21 DIAGNOSIS — E7800 Pure hypercholesterolemia, unspecified: Secondary | ICD-10-CM | POA: Diagnosis not present

## 2023-04-21 DIAGNOSIS — R059 Cough, unspecified: Secondary | ICD-10-CM | POA: Diagnosis not present

## 2023-04-21 DIAGNOSIS — K219 Gastro-esophageal reflux disease without esophagitis: Secondary | ICD-10-CM | POA: Diagnosis not present

## 2023-04-21 DIAGNOSIS — M810 Age-related osteoporosis without current pathological fracture: Secondary | ICD-10-CM | POA: Diagnosis not present

## 2023-04-21 DIAGNOSIS — J309 Allergic rhinitis, unspecified: Secondary | ICD-10-CM | POA: Diagnosis not present

## 2023-04-21 DIAGNOSIS — Z1331 Encounter for screening for depression: Secondary | ICD-10-CM | POA: Diagnosis not present

## 2023-04-21 DIAGNOSIS — F418 Other specified anxiety disorders: Secondary | ICD-10-CM | POA: Diagnosis not present

## 2023-04-21 DIAGNOSIS — R82998 Other abnormal findings in urine: Secondary | ICD-10-CM | POA: Diagnosis not present

## 2023-04-21 DIAGNOSIS — Z0000 Encounter for general adult medical examination without abnormal findings: Secondary | ICD-10-CM | POA: Diagnosis not present

## 2023-04-21 DIAGNOSIS — Z1339 Encounter for screening examination for other mental health and behavioral disorders: Secondary | ICD-10-CM | POA: Diagnosis not present

## 2023-04-21 DIAGNOSIS — N3281 Overactive bladder: Secondary | ICD-10-CM | POA: Diagnosis not present

## 2023-04-21 DIAGNOSIS — E559 Vitamin D deficiency, unspecified: Secondary | ICD-10-CM | POA: Diagnosis not present

## 2023-04-25 DIAGNOSIS — D225 Melanocytic nevi of trunk: Secondary | ICD-10-CM | POA: Diagnosis not present

## 2023-04-25 DIAGNOSIS — L718 Other rosacea: Secondary | ICD-10-CM | POA: Diagnosis not present

## 2023-04-25 DIAGNOSIS — D2261 Melanocytic nevi of right upper limb, including shoulder: Secondary | ICD-10-CM | POA: Diagnosis not present

## 2023-04-25 DIAGNOSIS — L821 Other seborrheic keratosis: Secondary | ICD-10-CM | POA: Diagnosis not present

## 2023-04-28 DIAGNOSIS — M545 Low back pain, unspecified: Secondary | ICD-10-CM | POA: Diagnosis not present

## 2023-04-28 DIAGNOSIS — M542 Cervicalgia: Secondary | ICD-10-CM | POA: Diagnosis not present

## 2023-04-28 DIAGNOSIS — M25511 Pain in right shoulder: Secondary | ICD-10-CM | POA: Diagnosis not present

## 2023-05-05 DIAGNOSIS — J3089 Other allergic rhinitis: Secondary | ICD-10-CM | POA: Diagnosis not present

## 2023-05-05 DIAGNOSIS — J3081 Allergic rhinitis due to animal (cat) (dog) hair and dander: Secondary | ICD-10-CM | POA: Diagnosis not present

## 2023-05-05 DIAGNOSIS — J301 Allergic rhinitis due to pollen: Secondary | ICD-10-CM | POA: Diagnosis not present

## 2023-05-12 DIAGNOSIS — M545 Low back pain, unspecified: Secondary | ICD-10-CM | POA: Diagnosis not present

## 2023-05-12 DIAGNOSIS — M25511 Pain in right shoulder: Secondary | ICD-10-CM | POA: Diagnosis not present

## 2023-05-12 DIAGNOSIS — M542 Cervicalgia: Secondary | ICD-10-CM | POA: Diagnosis not present

## 2023-05-15 ENCOUNTER — Other Ambulatory Visit: Payer: Self-pay | Admitting: Internal Medicine

## 2023-05-22 ENCOUNTER — Telehealth: Payer: Self-pay | Admitting: Internal Medicine

## 2023-05-22 NOTE — Telephone Encounter (Signed)
Patient called states she has been having a lot of stomach issues for a few weeks. She is not available till after 1:00 pm.

## 2023-05-23 NOTE — Telephone Encounter (Signed)
Pt states she does not want to see and APP. Reports she is having a flare of her reflux. She is taking omeprazole 40mg  in the am. Scheduled pt to see Dr. Marina Goodell 06/12/23 at 4pm. Pt aware of appt. Discussed with pt that she can try taking omeprazole 20mg  otc in the evenings prior to her appt to see if that helps. Pt verbalized understanding.

## 2023-05-30 DIAGNOSIS — R2241 Localized swelling, mass and lump, right lower limb: Secondary | ICD-10-CM | POA: Diagnosis not present

## 2023-05-31 ENCOUNTER — Encounter (HOSPITAL_COMMUNITY): Payer: Self-pay

## 2023-05-31 ENCOUNTER — Other Ambulatory Visit (HOSPITAL_COMMUNITY): Payer: Self-pay | Admitting: Physician Assistant

## 2023-05-31 ENCOUNTER — Ambulatory Visit (HOSPITAL_COMMUNITY): Payer: Medicare Other

## 2023-05-31 DIAGNOSIS — R2241 Localized swelling, mass and lump, right lower limb: Secondary | ICD-10-CM

## 2023-06-01 ENCOUNTER — Ambulatory Visit (HOSPITAL_BASED_OUTPATIENT_CLINIC_OR_DEPARTMENT_OTHER)
Admission: RE | Admit: 2023-06-01 | Discharge: 2023-06-01 | Disposition: A | Discharge status: Home / Self Care | Payer: Medicare Other | Source: Ambulatory Visit | Attending: Physician Assistant | Admitting: Physician Assistant

## 2023-06-01 DIAGNOSIS — R2241 Localized swelling, mass and lump, right lower limb: Secondary | ICD-10-CM | POA: Insufficient documentation

## 2023-06-07 DIAGNOSIS — R58 Hemorrhage, not elsewhere classified: Secondary | ICD-10-CM | POA: Diagnosis not present

## 2023-06-07 DIAGNOSIS — M810 Age-related osteoporosis without current pathological fracture: Secondary | ICD-10-CM | POA: Diagnosis not present

## 2023-06-07 DIAGNOSIS — M7130 Other bursal cyst, unspecified site: Secondary | ICD-10-CM | POA: Diagnosis not present

## 2023-06-07 DIAGNOSIS — I10 Essential (primary) hypertension: Secondary | ICD-10-CM | POA: Diagnosis not present

## 2023-06-09 DIAGNOSIS — J301 Allergic rhinitis due to pollen: Secondary | ICD-10-CM | POA: Diagnosis not present

## 2023-06-09 DIAGNOSIS — J3081 Allergic rhinitis due to animal (cat) (dog) hair and dander: Secondary | ICD-10-CM | POA: Diagnosis not present

## 2023-06-09 DIAGNOSIS — J3089 Other allergic rhinitis: Secondary | ICD-10-CM | POA: Diagnosis not present

## 2023-06-12 ENCOUNTER — Ambulatory Visit (INDEPENDENT_AMBULATORY_CARE_PROVIDER_SITE_OTHER): Payer: Medicare Other | Admitting: Internal Medicine

## 2023-06-12 ENCOUNTER — Encounter: Payer: Self-pay | Admitting: Internal Medicine

## 2023-06-12 VITALS — BP 102/60 | HR 75 | Ht 63.5 in | Wt 110.0 lb

## 2023-06-12 DIAGNOSIS — M542 Cervicalgia: Secondary | ICD-10-CM | POA: Diagnosis not present

## 2023-06-12 DIAGNOSIS — R140 Abdominal distension (gaseous): Secondary | ICD-10-CM | POA: Diagnosis not present

## 2023-06-12 DIAGNOSIS — M25511 Pain in right shoulder: Secondary | ICD-10-CM | POA: Diagnosis not present

## 2023-06-12 DIAGNOSIS — M25551 Pain in right hip: Secondary | ICD-10-CM | POA: Diagnosis not present

## 2023-06-12 DIAGNOSIS — M545 Low back pain, unspecified: Secondary | ICD-10-CM | POA: Diagnosis not present

## 2023-06-12 NOTE — Patient Instructions (Addendum)
_______________________________________________________  If your blood pressure at your visit was 140/90 or greater, please contact your primary care physician to follow up on this.  _______________________________________________________  If you are age 76 or older, your body mass index should be between 23-30. Your Body mass index is 19.18 kg/m. If this is out of the aforementioned range listed, please consider follow up with your Primary Care Provider.  If you are age 31 or younger, your body mass index should be between 19-25. Your Body mass index is 19.18 kg/m. If this is out of the aformentioned range listed, please consider follow up with your Primary Care Provider.   ________________________________________________________  The [facility] providers would like to encourage you to use [facility] to communicate with providers for non-urgent requests or questions.  Due to long hold times on the telephone, sending your provider a message by [facility] may be a faster and more efficient way to get a response.  Please allow 48 business hours for a response.  Please remember that this is for non-urgent requests.  _______________________________________________________   We have given you samples of the following medication to take: Ibgard-  take one before each meal  Take lactaid with dairy products as needed.  It was a pleasure to see you today!  Thank you for trusting me with your gastrointestinal care!

## 2023-06-12 NOTE — Progress Notes (Signed)
HISTORY OF PRESENT ILLNESS:  Madison Kramer is a 77 y.o. female with a history of chronic throat clearing behavior (query GERD), chronic abdominal bloating, intermittent issues with constipation, diverticulosis, negative for neoplasia colonoscopy 2018, and health-related anxiety.  She presents today with chief complaint of abdominal bloating.  Patient tells me that she recently developed issues with postprandial bloating.  Particular better with dairy products.  She has stopped dairy.  She takes [place] twice daily.  She does have daily bowel movements.  Often several.  No bleeding.  Comprehensive metabolic panel from October 2023 was unremarkable  REVIEW OF SYSTEMS:  All non-GI ROS negative unless otherwise stated in the HPI except for back pain, arthritis, sinus and allergy, bruising  Past Medical History:  Diagnosis Date   Allergic rhinitis    Anxiety    Arthritis    Asthma    Atypical chest pain    Possibly from DES   Back pain    Cataract    Hx; of   Diverticulosis    Left   Elevated cholesterol    GERD (gastroesophageal reflux disease)    Hemoptysis 2007   From reflux pharyngitis   History of nuclear stress test    Myoview 5/17: EF 70%, no scar or ischemia, excellent exercise capacity, low risk   Hypertension    Insomnia    Lactose intolerance    Lichen sclerosus et atrophicus of the vulva 2008   Biopsy-proven   Nocturia    Osteoporosis 10/2015   T score -2.5 left femoral neck   Pneumonia    Raynaud's syndrome    "possible"    Past Surgical History:  Procedure Laterality Date   BREAST SURGERY  2001   REDUCTION MAMMAPLASTY   COLONOSCOPY     FOOT SURGERY  2009   HYSTEROSCOPY  2003   HYSTEROSCOPIC RESECTION OF MYOMA   INGUINAL HERNIA REPAIR  1971   BILATERAL   LUMBAR LAMINECTOMY/DECOMPRESSION MICRODISCECTOMY Right 08/30/2013   Procedure: Right Lumbar four-five Extraforaminal Diskectomy;  Surgeon: Hewitt Shorts, MD;  Location: [facility];  Service:  Neurosurgery;  Laterality: Right;   MANDIBLE SURGERY     MYOMECTOMY     TONSILLECTOMY     TUBAL LIGATION  1987    Social History MARYKATHLEEN MOZEE  reports that she quit smoking about 34 years ago. Her smoking use included cigarettes. She has never used smokeless tobacco. She reports current alcohol use of about 7.0 standard drinks of alcohol per week. She reports that she does not use drugs.  family history includes Breast cancer in her maternal aunt; Diabetes in her mother; Heart disease in her maternal uncle, mother, and paternal grandfather; Hypertension in her mother; Multiple myeloma in her father.  Allergies  Allergen Reactions   Atorvastatin Other (See Comments)    Pt reports causes lower extremity muscle cramps   Codeine Nausea Only   Erythromycin Other (See Comments)    Stomach cramping   Macrobid [Nitrofurantoin] Nausea Only    headache   Sulfa Antibiotics     As a child   Sulfamethoxazole Other (See Comments)    Unsdure of reaction. Had as a child.   Sulfonamide Derivatives    Tetracyclines & Related     unknown   Tramadol Nausea Only and Other (See Comments)    Dizziness and mind racing       PHYSICAL EXAMINATION: Vital signs: BP 102/60   Pulse 75   Ht 5' 3.5" (1.613 m)   Wt 110  lb (49.9 kg)   BMI 19.18 kg/m   Constitutional: generally well-appearing, no acute distress Psychiatric: alert and oriented x3, cooperative Eyes: extraocular movements intact, anicteric, conjunctiva pink Mouth: oral pharynx moist, no lesions Neck: supple no lymphadenopathy Cardiovascular: heart regular rate and rhythm, no murmur Lungs: clear to auscultation bilaterally Abdomen: soft, nontender, nondistended, no obvious ascites, no peritoneal signs, normal bowel sounds, no organomegaly Rectal: Omitted Extremities: no clubbing, cyanosis, or lower extremity edema bilaterally Skin: no lesions on visible extremities Neuro: No focal deficits.  Cranial nerves intact  ASSESSMENT:  1.   Abdominal bloating.  No worrisome features.  Seemingly worse with dairy. 2.  History of issues with constipation.  Not problematic at present 3.  GERD.  On PPI 4.  Negative for neoplasia colonoscopy 2018.  Aged out of surveillance 5.  Health-related anxiety   PLAN:  1.  Offered trial of metronidazole for possible bacterial overgrowth.  She wants to hold off at this time that she would prefer not giving up alcoholic beverage during the treatment. 2.  Recommended IBgard 1 before meals.  Can pick up over-the-counter 3.  Recommended Lactaid with dairy products.  Can pick up over-the-counter 4.  Patient will call us in about a month with an update on her condition. Total time 30 minutes was spent preparing to see the patient, obtaining comprehensive interval history, performing medically appropriate physical exam, counseling educating the patient regarding the above listed issues, ordering medical therapies, discussing follow-up plan, and documenting clinical information in the health record

## 2023-06-14 DIAGNOSIS — J3089 Other allergic rhinitis: Secondary | ICD-10-CM | POA: Diagnosis not present

## 2023-06-14 DIAGNOSIS — J3081 Allergic rhinitis due to animal (cat) (dog) hair and dander: Secondary | ICD-10-CM | POA: Diagnosis not present

## 2023-06-14 DIAGNOSIS — J301 Allergic rhinitis due to pollen: Secondary | ICD-10-CM | POA: Diagnosis not present

## 2023-06-21 DIAGNOSIS — J3089 Other allergic rhinitis: Secondary | ICD-10-CM | POA: Diagnosis not present

## 2023-06-27 DIAGNOSIS — J3089 Other allergic rhinitis: Secondary | ICD-10-CM | POA: Diagnosis not present

## 2023-07-13 DIAGNOSIS — M542 Cervicalgia: Secondary | ICD-10-CM | POA: Diagnosis not present

## 2023-07-13 DIAGNOSIS — M25511 Pain in right shoulder: Secondary | ICD-10-CM | POA: Diagnosis not present

## 2023-07-13 DIAGNOSIS — M25551 Pain in right hip: Secondary | ICD-10-CM | POA: Diagnosis not present

## 2023-07-13 DIAGNOSIS — M545 Low back pain, unspecified: Secondary | ICD-10-CM | POA: Diagnosis not present

## 2023-07-28 ENCOUNTER — Other Ambulatory Visit: Payer: Self-pay

## 2023-07-28 DIAGNOSIS — E782 Mixed hyperlipidemia: Secondary | ICD-10-CM

## 2023-07-28 DIAGNOSIS — Z79899 Other long term (current) drug therapy: Secondary | ICD-10-CM

## 2023-07-28 DIAGNOSIS — I2510 Atherosclerotic heart disease of native coronary artery without angina pectoris: Secondary | ICD-10-CM

## 2023-07-28 MED ORDER — EZETIMIBE 10 MG PO TABS: 10.0000 mg | ORAL_TABLET | Freq: Every day | ORAL | 0 refills | Status: DC

## 2023-08-01 DIAGNOSIS — J3089 Other allergic rhinitis: Secondary | ICD-10-CM | POA: Diagnosis not present

## 2023-08-01 DIAGNOSIS — J3081 Allergic rhinitis due to animal (cat) (dog) hair and dander: Secondary | ICD-10-CM | POA: Diagnosis not present

## 2023-08-01 DIAGNOSIS — J301 Allergic rhinitis due to pollen: Secondary | ICD-10-CM | POA: Diagnosis not present

## 2023-08-16 DIAGNOSIS — M25551 Pain in right hip: Secondary | ICD-10-CM | POA: Diagnosis not present

## 2023-08-16 DIAGNOSIS — M542 Cervicalgia: Secondary | ICD-10-CM | POA: Diagnosis not present

## 2023-08-16 DIAGNOSIS — M25511 Pain in right shoulder: Secondary | ICD-10-CM | POA: Diagnosis not present

## 2023-08-16 DIAGNOSIS — M545 Low back pain, unspecified: Secondary | ICD-10-CM | POA: Diagnosis not present

## 2023-08-21 DIAGNOSIS — K219 Gastro-esophageal reflux disease without esophagitis: Secondary | ICD-10-CM | POA: Diagnosis not present

## 2023-08-21 DIAGNOSIS — J4530 Mild persistent asthma, uncomplicated: Secondary | ICD-10-CM | POA: Diagnosis not present

## 2023-08-21 DIAGNOSIS — J3089 Other allergic rhinitis: Secondary | ICD-10-CM | POA: Diagnosis not present

## 2023-08-21 DIAGNOSIS — H1045 Other chronic allergic conjunctivitis: Secondary | ICD-10-CM | POA: Diagnosis not present

## 2023-08-23 ENCOUNTER — Other Ambulatory Visit: Payer: Self-pay | Admitting: Internal Medicine

## 2023-08-23 ENCOUNTER — Ambulatory Visit: Payer: Self-pay | Admitting: Cardiology

## 2023-08-23 NOTE — Progress Notes (Signed)
No Show     Primary Physician/Referring:  Chilton Greathouse, MD  Patient ID: Madison Kramer, female    DOB: [birth year], 77 y.o.   MRN: [id number]  No chief complaint on file.  HPI:    Madison Kramer  is a 77 y.o. 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    Past Medical History:  Diagnosis Date   Allergic rhinitis    Anxiety    Arthritis    Asthma    Atypical chest pain    Possibly from DES   Back pain    Cataract    Hx; of   Diverticulosis    Left   Elevated cholesterol    GERD (gastroesophageal reflux disease)    Hemoptysis 2007   From reflux pharyngitis   History of nuclear stress test    Myoview 5/17: EF 70%, no scar or ischemia, excellent exercise capacity, low risk   Hypertension    Insomnia    Lactose intolerance    Lichen sclerosus et atrophicus of the vulva 2008   Biopsy-proven   Nocturia    Osteoporosis 10/2015   T score -2.5 left femoral neck   Pneumonia    Raynaud's syndrome    "possible"   Past Surgical History:  Procedure Laterality Date   BREAST SURGERY  2001   REDUCTION MAMMAPLASTY   COLONOSCOPY     FOOT SURGERY  2009   HYSTEROSCOPY  2003   HYSTEROSCOPIC RESECTION OF MYOMA   INGUINAL HERNIA REPAIR  1971   BILATERAL   LUMBAR LAMINECTOMY/DECOMPRESSION MICRODISCECTOMY Right 08/30/2013   Procedure: Right Lumbar four-five Extraforaminal Diskectomy;  Surgeon: Hewitt Shorts, MD;  Location: [facility];  Service: Neurosurgery;  Laterality: Right;   MANDIBLE SURGERY     MYOMECTOMY     TONSILLECTOMY     TUBAL LIGATION  1987   Family History  Problem Relation Age of Onset   Heart disease Mother        died at 3; ? MI   Hypertension Mother    Diabetes Mother    Multiple myeloma Father    Heart disease Paternal Grandfather        age 86 - died of MI   Breast cancer  Maternal Aunt        Age 83's   Heart disease Maternal Uncle     Social History   Tobacco Use   Smoking status: Former    Current packs/day: 0.00    Types: Cigarettes    Start date: 12/12/1968    Quit date: 12/12/1988    Years since quitting: 34.7   Smokeless tobacco: Never  Substance Use Topics   Alcohol use: Yes    Alcohol/week: 7.0 standard drinks of alcohol    Types: 7 Standard drinks or equivalent per week    Comment: 7 glasses   Marital Status: Divorced  ROS  ROS Objective      06/12/2023    3:53 PM 09/19/2022    2:28 PM 06/20/2022   10:52 AM  Vitals with BMI  Height 5' 3.5" 5' 3.5" 5\' 3"   Weight 110 lbs 109 lbs 10 oz 107 lbs 13 oz  BMI 19.18 19.11 19.1  Systolic 102 130 253  Diastolic 60 68 72  Pulse 75 62 60   There were no vitals taken for this visit.   Physical Exam Laboratory examination:  Recent Labs    09/19/22 1522  NA 141  K 4.7  CL 103  CO2 22  GLUCOSE 109*  BUN 21  CREATININE 0.73  CALCIUM 9.4    Lab Results  Component Value Date   GLUCOSE 109 (H) 09/19/2022   NA 141 09/19/2022   K 4.7 09/19/2022   CL 103 09/19/2022   CO2 22 09/19/2022   BUN 21 09/19/2022   CREATININE 0.73 09/19/2022   EGFR 86 09/19/2022   CALCIUM 9.4 09/19/2022   PROT 7.0 09/19/2022   ALBUMIN 4.7 09/19/2022   LABGLOB 2.3 09/19/2022   AGRATIO 2.0 09/19/2022   BILITOT 0.3 09/19/2022   ALKPHOS 58 09/19/2022   AST 25 09/19/2022   ALT 24 09/19/2022      Lab Results  Component Value Date   ALT 24 09/19/2022   AST 25 09/19/2022   ALKPHOS 58 09/19/2022   BILITOT 0.3 09/19/2022       Latest Ref Rng & Units 02/03/2020   11:36 AM 07/03/2017    7:58 AM 08/28/2013   10:01 AM  CBC  WBC 3.4 - 10.8 x10E3/uL 4.5  4.4  10.7   Hemoglobin 11.1 - 15.9 g/dL 13.0  86.5  78.4   Hematocrit 34.0 - 46.6 % 39.7  40.2  44.9   Platelets 150 - 450 x10E3/uL 292  269  338        Latest Ref Rng & Units 09/19/2022    3:22 PM 06/09/2021    8:51 AM 05/25/2020    8:37 AM  Hepatic  Function  Total Protein 6.0 - 8.5 g/dL 7.0  7.2  7.0   Albumin 3.8 - 4.8 g/dL 4.7  4.5  4.6   AST 0 - 40 IU/L 25  19  29    ALT 0 - 32 IU/L 24  15  32   Alk Phosphatase 44 - 121 IU/L 58  50  53   Total Bilirubin 0.0 - 1.2 mg/dL 0.3  0.6  0.5   Bilirubin, Direct 0.00 - 0.40 mg/dL  6.96  2.95    Lab Results  Component Value Date   CHOL 164 08/16/2022   HDL 83 08/16/2022   LDLCALC 57 08/16/2022   TRIG 147 08/16/2022   CHOLHDL 2.0 08/16/2022    TSH Recent Labs    09/19/22 1522  TSH 1.760   Radiology:    Cardiac Studies:   Echocardiogram 04/09/2013:  - Left ventricle: The cavity size was normal. Wall thickness  was normal. Systolic function was normal. The estimated  ejection fraction was in the range of 55% to 60%. Wall    motion was normal; there were no regional wall motion abnormalities. Doppler parameters are consistent with abnormal left ventricular relaxation (grade 1 diastolic dysfunction).  - Mitral valve: Valve area by pressure half-time: 1.73cm^2.  - Atrial septum: No defect or patent foramen ovale was identified.   Coronary CTA 12/03/2018: Coronary calcium score of 255. This was 24 percentile for age and sex matched control. 1. Left Main:  No significant stenosis. 2. LAD: No significant stenosis.  Large vessel, large D1, moderate focal calcific plaque in proximal LAD between 50 to 69%. 3. LCX: No significant stenosis. 4. RCA: No significant stenosis.  Calcified plaque in the mid RCA, 25 to 50%.   IMPRESSION: 1.  CT FFR analysis didn't show any significant stenosis.  EKG:      Medications and allergies   Allergies  Allergen Reactions   Atorvastatin Other (See Comments)    Pt  reports causes lower extremity muscle cramps   Codeine Nausea Only   Erythromycin Other (See Comments)    Stomach cramping   Macrobid [Nitrofurantoin] Nausea Only    headache   Sulfa Antibiotics     As a child   Sulfamethoxazole Other (See Comments)    Unsdure of reaction. Had as  a child.   Sulfonamide Derivatives    Tetracyclines & Related     unknown   Tramadol Nausea Only and Other (See Comments)    Dizziness and mind racing    Medication list   Current Outpatient Medications:    Ascorbic Acid (VITAMIN C) 1000 MG tablet, Take 3,000 mg by mouth daily., Disp: , Rfl:    Calcium Carbonate-Vitamin D (CALCIUM + D PO), Take 1,200 mg by mouth., Disp: , Rfl:    Cholecalciferol (VITAMIN D PO), Take 2,000 Units by mouth daily. , Disp: , Rfl:    EPINEPHrine 0.3 mg/0.3 mL IJ SOAJ injection, USE AS DIRECTED AS NEEDED SYSTEMIC REACTION, Disp: , Rfl:    ezetimibe (ZETIA) 10 MG tablet, Take 1 tablet (10 mg total) by mouth daily., Disp: 30 tablet, Rfl: 0   fluticasone (FLONASE) 50 MCG/ACT nasal spray, Place 1 spray into both nostrils as needed. , Disp: , Rfl:    Glucosamine-Chondroitin-MSM 500-200-150 MG TABS, Take 1 tablet by mouth 2 (two) times daily., Disp: , Rfl:    losartan (COZAAR) 25 MG tablet, Take 0.5 tablets (12.5 mg total) by mouth daily. Please keep upcominig appt with Dr. Shari Prows in July 2023 before anymore refills. Thank you Final attempt, Disp: 45 tablet, Rfl: 3   MELATONIN PO, Take 1 mg by mouth every other day. gummy (Patient not taking: Reported on 06/12/2023), Disp: , Rfl:    meloxicam (MOBIC) 7.5 MG tablet, Take 7.5 mg by mouth as needed.  (Patient not taking: Reported on 06/12/2023), Disp: , Rfl:    mirabegron ER (MYRBETRIQ) 50 MG TB24 tablet, Take 50 mg by mouth daily., Disp: , Rfl:    omeprazole (PRILOSEC) 40 MG capsule, Take 1 capsule (40 mg total) by mouth daily. Office visit for further refills, Disp: 90 capsule, Rfl: 0   Probiotic Product (PROBIOTIC DAILY PO), Take 1 tablet by mouth 2 (two) times daily. , Disp: , Rfl:    rosuvastatin (CRESTOR) 20 MG tablet, Take 1 tablet (20 mg total) by mouth daily., Disp: 90 tablet, Rfl: 0   sertraline (ZOLOFT) 50 MG tablet, Take 50 mg by mouth daily., Disp: , Rfl:    TURMERIC PO, Take 1 tablet by mouth 2 (two) times  daily. , Disp: , Rfl:   Assessment  No diagnosis found.   No orders of the defined types were placed in this encounter.   No orders of the defined types were placed in this encounter.   There are no discontinued medications.   Recommendations:   Madison Kramer is a 77 y.o.  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  Patient not seen    Yates Decamp, MD, [facility] 08/23/2023, 1:22 PM Office: [phone]

## 2023-09-02 DIAGNOSIS — M25511 Pain in right shoulder: Secondary | ICD-10-CM | POA: Diagnosis not present

## 2023-09-02 DIAGNOSIS — M25551 Pain in right hip: Secondary | ICD-10-CM | POA: Diagnosis not present

## 2023-09-02 DIAGNOSIS — M542 Cervicalgia: Secondary | ICD-10-CM | POA: Diagnosis not present

## 2023-09-02 DIAGNOSIS — M545 Low back pain, unspecified: Secondary | ICD-10-CM | POA: Diagnosis not present

## 2023-09-04 DIAGNOSIS — J301 Allergic rhinitis due to pollen: Secondary | ICD-10-CM | POA: Diagnosis not present

## 2023-09-04 DIAGNOSIS — J3089 Other allergic rhinitis: Secondary | ICD-10-CM | POA: Diagnosis not present

## 2023-09-04 DIAGNOSIS — J3081 Allergic rhinitis due to animal (cat) (dog) hair and dander: Secondary | ICD-10-CM | POA: Diagnosis not present

## 2023-09-11 DIAGNOSIS — Z23 Encounter for immunization: Secondary | ICD-10-CM | POA: Diagnosis not present

## 2023-09-12 DIAGNOSIS — M25551 Pain in right hip: Secondary | ICD-10-CM | POA: Diagnosis not present

## 2023-09-12 DIAGNOSIS — M542 Cervicalgia: Secondary | ICD-10-CM | POA: Diagnosis not present

## 2023-09-12 DIAGNOSIS — M545 Low back pain, unspecified: Secondary | ICD-10-CM | POA: Diagnosis not present

## 2023-09-12 DIAGNOSIS — M25511 Pain in right shoulder: Secondary | ICD-10-CM | POA: Diagnosis not present

## 2023-09-14 ENCOUNTER — Telehealth (HOSPITAL_BASED_OUTPATIENT_CLINIC_OR_DEPARTMENT_OTHER): Payer: Self-pay | Admitting: *Deleted

## 2023-09-14 NOTE — Telephone Encounter (Signed)
Called pt to let her know that her breast density was a grade A with her most recent mammogram and breasts can feel dense to the touch but not necessarily look that way on mammogram. Advised that breast density A is the best for seeing with mammogram as there is very little dense connective tissue present. Advised that MRI and ultrasound are not indicated or recommended with this finding. Pt verbalized understanding.

## 2023-09-15 DIAGNOSIS — Z23 Encounter for immunization: Secondary | ICD-10-CM | POA: Diagnosis not present

## 2023-09-18 ENCOUNTER — Telehealth: Payer: Self-pay | Admitting: Physician Assistant

## 2023-09-18 DIAGNOSIS — I2510 Atherosclerotic heart disease of native coronary artery without angina pectoris: Secondary | ICD-10-CM

## 2023-09-18 DIAGNOSIS — I25119 Atherosclerotic heart disease of native coronary artery with unspecified angina pectoris: Secondary | ICD-10-CM

## 2023-09-18 DIAGNOSIS — E782 Mixed hyperlipidemia: Secondary | ICD-10-CM

## 2023-09-18 DIAGNOSIS — Z79899 Other long term (current) drug therapy: Secondary | ICD-10-CM

## 2023-09-18 MED ORDER — ROSUVASTATIN CALCIUM 20 MG PO TABS
20.0000 mg | ORAL_TABLET | Freq: Every day | ORAL | 0 refills | Status: DC
Start: 2023-09-18 — End: 2023-10-10

## 2023-09-18 NOTE — Telephone Encounter (Signed)
Called patient and told her that her last dose of Crestor was at 20 mg . Patient stated she has been taking 10 mg. Informed patient that dose can be changed by the provider, but she would have to see a provider. Patient has an appointment at the end of the month, but she is almost out of crestor. Sent in Crestor 20 mg, and patient stated she could cut them in half.

## 2023-09-18 NOTE — Telephone Encounter (Signed)
  Pt c/o medication issue:  1. Name of Medication:   rosuvastatin (CRESTOR) 20 MG tablet    2. How are you currently taking this medication (dosage and times per day)?   Take 1 tablet (20 mg total) by mouth daily.    3. Are you having a reaction (difficulty breathing--STAT)? No   4. What is your medication issue? Diannia Ruder at [facility] pharmacy called requesting refill for this medication. However, pt is taking 10 mg of her crestor

## 2023-09-28 DIAGNOSIS — M545 Low back pain, unspecified: Secondary | ICD-10-CM | POA: Diagnosis not present

## 2023-09-28 DIAGNOSIS — M25551 Pain in right hip: Secondary | ICD-10-CM | POA: Diagnosis not present

## 2023-09-28 DIAGNOSIS — M542 Cervicalgia: Secondary | ICD-10-CM | POA: Diagnosis not present

## 2023-09-28 DIAGNOSIS — M25511 Pain in right shoulder: Secondary | ICD-10-CM | POA: Diagnosis not present

## 2023-10-04 DIAGNOSIS — J301 Allergic rhinitis due to pollen: Secondary | ICD-10-CM | POA: Diagnosis not present

## 2023-10-04 DIAGNOSIS — J3089 Other allergic rhinitis: Secondary | ICD-10-CM | POA: Diagnosis not present

## 2023-10-04 DIAGNOSIS — J3081 Allergic rhinitis due to animal (cat) (dog) hair and dander: Secondary | ICD-10-CM | POA: Diagnosis not present

## 2023-10-06 DIAGNOSIS — M25562 Pain in left knee: Secondary | ICD-10-CM | POA: Diagnosis not present

## 2023-10-06 DIAGNOSIS — M25511 Pain in right shoulder: Secondary | ICD-10-CM | POA: Diagnosis not present

## 2023-10-09 DIAGNOSIS — Z1231 Encounter for screening mammogram for malignant neoplasm of breast: Secondary | ICD-10-CM | POA: Diagnosis not present

## 2023-10-09 NOTE — Progress Notes (Unsigned)
Cardiology Office Note:    Date:  10/10/2023  ID:  Karl Pock, DOB 1946/05/30, MRN [id number] PCP: Chilton Greathouse, MD  [facility] Providers Cardiologist:  Yates Decamp, MD       Patient Profile:      Coronary artery disease  Myoview 04/25/2016: EF 70, no scar or ischemia, low risk CCTA 11/30/2018: CAC score 255 (87th percentile); RCA mid 25-50; LAD proximal 25-50 (?  50-69), mid 25-50>> FFR normal Ectopic atrial rhythm Palpitations Hypertension  Hyperlipidemia   Vascular screen 01/10/2020: No ICA stenosis bilaterally; ABIs normal bilaterally; no AAA        History of Present Illness:  Discussed the use of AI scribe software for clinical note transcription with the patient, who gave verbal consent to proceed.  Madison Kramer is a 77 y.o. female who returns for follow-up of CAD.  She was last seen by Dr. Shari Prows in July 2023.  She was to establish with Dr. Jacinto Halim in Sept but because of his move here, that appt was changed until Feb. She is here alone.  She reports no symptoms of chest pain, shortness of breath, or passing out. She reports no issues with shortness of breath while laying flat or swelling in her legs.  She has been taking half of her prescribed dose of rosuvastatin, unsure if she should be taking 20mg  or 10mg . The patient has a family history of heart disease, with her mother passing away at 7 due to heart-related issues.  She does not smoke and does not take aspirin due to previous experiences with significant bruising.      Review of Systems  Cardiovascular:  Negative for claudication.  See HPI     Studies Reviewed:   EKG Interpretation Date/Time:  Tuesday October 10 2023 11:07:39 EDT Ventricular Rate:  67 PR Interval:  152 QRS Duration:  80 QT Interval:  390 QTC Calculation: 412 R Axis:   76  Text Interpretation: Ectopic atrial rhythm Low voltage QRS Similar to old ECGs Confirmed by Tereso Newcomer [phone]) on 10/10/2023 11:16:13 AM    Results    LABS-chart review Potassium: 4.7 (09/19/2022) TSH: 1.76 (09/19/2022) Creatinine: 0.73 (09/19/2022) ALT: 24 (09/19/2022) LDL: 57 (08/16/2022)      Risk Assessment/Calculations:             Physical Exam:   VS:  BP 100/60   Pulse 67   Ht 5' 3.5" (1.613 m)   Wt 110 lb 6.4 oz (50.1 kg)   SpO2 96%   BMI 19.25 kg/m    Wt Readings from Last 3 Encounters:  10/10/23 110 lb 6.4 oz (50.1 kg)  06/12/23 110 lb (49.9 kg)  09/19/22 109 lb 9.6 oz (49.7 kg)    Constitutional:      Appearance: Healthy appearance. Not in distress.  Neck:     Vascular: No carotid bruit or JVR. JVD normal.  Pulmonary:     Breath sounds: Normal breath sounds. No wheezing. No rales.  Cardiovascular:     Normal rate. Regular rhythm.     Murmurs: There is no murmur.  Edema:    Peripheral edema absent.  Abdominal:     Palpations: Abdomen is soft.        Assessment and Plan:   Assessment & Plan Coronary artery disease involving native coronary artery of native heart without angina pectoris CCTA in December 2019 with high calcium score 255 (87th percentile).  She had mild to moderate nonobstructive disease by FFR.  She is not  having symptoms of chest pain to suggest angina.  Currently on Zetia 10mg  daily and Rosuvastatin 10mg  daily. Discussed primary prevention with aspirin given high calcium score.  She does not like to take aspirin daily given bruising issues. -Start Aspirin 81mg  three days a week. -Continue Zetia 10mg  daily and Rosuvastatin 10mg  daily. -Obtain fasting CMET and lipids in the next week. If LDL above goal, increase Rosuvastatin back to 20mg  daily. Primary hypertension Blood pressure controlled on low dose Losartan. -Continue Losartan 12.5mg  daily. Pure hypercholesterolemia Currently on Zetia 10mg  daily and Rosuvastatin 10mg  daily. -Continue Zetia 10mg  daily and Rosuvastatin 10mg  daily. -Obtain fasting CMET and lipids in the next week. If LDL above goal, increase Rosuvastatin back to 20mg   daily.       Dispo:  Return in 4 months (on 02/07/2024) for Scheduled Follow Up, w/ Dr. Jacinto Halim.  Signed, Tereso Newcomer, PA-C

## 2023-10-10 ENCOUNTER — Ambulatory Visit: Payer: Medicare Other | Attending: Cardiology | Admitting: Physician Assistant

## 2023-10-10 ENCOUNTER — Other Ambulatory Visit: Payer: Self-pay | Admitting: *Deleted

## 2023-10-10 ENCOUNTER — Encounter: Payer: Self-pay | Admitting: Physician Assistant

## 2023-10-10 VITALS — BP 100/60 | HR 67 | Ht 63.5 in | Wt 110.4 lb

## 2023-10-10 DIAGNOSIS — E7800 Pure hypercholesterolemia, unspecified: Secondary | ICD-10-CM

## 2023-10-10 DIAGNOSIS — I2510 Atherosclerotic heart disease of native coronary artery without angina pectoris: Secondary | ICD-10-CM

## 2023-10-10 DIAGNOSIS — Z79899 Other long term (current) drug therapy: Secondary | ICD-10-CM

## 2023-10-10 DIAGNOSIS — I10 Essential (primary) hypertension: Secondary | ICD-10-CM

## 2023-10-10 DIAGNOSIS — E782 Mixed hyperlipidemia: Secondary | ICD-10-CM

## 2023-10-10 MED ORDER — ASPIRIN 81 MG PO TBEC: 81.0000 mg | DELAYED_RELEASE_TABLET | ORAL | Status: DC

## 2023-10-10 MED ORDER — EZETIMIBE 10 MG PO TABS: 10.0000 mg | ORAL_TABLET | Freq: Every day | ORAL | 3 refills | Status: DC

## 2023-10-10 MED ORDER — ROSUVASTATIN CALCIUM 10 MG PO TABS: 10.0000 mg | ORAL_TABLET | Freq: Every day | ORAL | 3 refills | Status: DC

## 2023-10-10 NOTE — Assessment & Plan Note (Addendum)
CCTA in December 2019 with high calcium score 255 (87th percentile).  She had mild to moderate nonobstructive disease by FFR.  She is not having symptoms of chest pain to suggest angina.  Currently on Zetia 10mg  daily and Rosuvastatin 10mg  daily. Discussed primary prevention with aspirin given high calcium score.  She does not like to take aspirin daily given bruising issues. -Start Aspirin 81mg  three days a week. -Continue Zetia 10mg  daily and Rosuvastatin 10mg  daily. -Obtain fasting CMET and lipids in the next week. If LDL above goal, increase Rosuvastatin back to 20mg  daily.

## 2023-10-10 NOTE — Assessment & Plan Note (Signed)
Blood pressure controlled on low dose Losartan. -Continue Losartan 12.5mg  daily.

## 2023-10-10 NOTE — Assessment & Plan Note (Signed)
Currently on Zetia 10mg  daily and Rosuvastatin 10mg  daily. -Continue Zetia 10mg  daily and Rosuvastatin 10mg  daily. -Obtain fasting CMET and lipids in the next week. If LDL above goal, increase Rosuvastatin back to 20mg  daily.

## 2023-10-10 NOTE — Patient Instructions (Signed)
Medication Instructions:  Your physician has recommended you make the following change in your medication:   START Aspirin 81 mg taking on Monday, Wednesday, & Friday's.  *If you need a refill on your cardiac medications before your next appointment, please call your pharmacy*   Lab Work: WITHIN THE NEXT WEEK OR SO, COME TO THE OFFICE, ANYTIME AFTER 7:30, FASTING, FOR:  LIPID & CMET  If you have labs (blood work) drawn today and your tests are completely normal, you will receive your results only by: MyChart Message (if you have MyChart) OR A paper copy in the mail If you have any lab test that is abnormal or we need to change your treatment, we will call you to review the results.   Testing/Procedures: None ordered   Follow-Up: At [facility], you and your health needs are our priority.  As part of our continuing mission to provide you with exceptional heart care, we have created designated Provider Care Teams.  These Care Teams include your primary Cardiologist (physician) and Advanced Practice Providers (APPs -  Physician Assistants and Nurse Practitioners) who all work together to provide you with the care you need, when you need it.  We recommend signing up for the patient portal called "MyChart".  Sign up information is provided on this After Visit Summary.  MyChart is used to connect with patients for Virtual Visits (Telemedicine).  Patients are able to view lab/test results, encounter notes, upcoming appointments, etc.  Non-urgent messages can be sent to your provider as well.   To learn more about what you can do with MyChart, go to [email or web address].    Your next appointment:   12 month(s)  Provider:   As scheduled     Other Instructions

## 2023-10-13 ENCOUNTER — Telehealth: Payer: Self-pay

## 2023-10-13 ENCOUNTER — Encounter (HOSPITAL_BASED_OUTPATIENT_CLINIC_OR_DEPARTMENT_OTHER): Payer: Self-pay | Admitting: *Deleted

## 2023-10-13 DIAGNOSIS — E7800 Pure hypercholesterolemia, unspecified: Secondary | ICD-10-CM

## 2023-10-13 NOTE — Telephone Encounter (Signed)
[facility] was unable to see the lipid panel and CMP orders for this pt. Reordered the labs and released them for [facility] to be able to associate the order with the blood work.

## 2023-10-14 LAB — LIPID PANEL
Chol/HDL Ratio: 2.2 ratio (ref 0.0–4.4)
Cholesterol, Total: 152 mg/dL (ref 100–199)
HDL: 68 mg/dL (ref >39)
LDL Chol Calc (NIH): 64 mg/dL (ref 0–99)
Triglycerides: 114 mg/dL (ref 0–149)
VLDL Cholesterol Cal: 20 mg/dL (ref 5–40)

## 2023-10-14 LAB — COMPREHENSIVE METABOLIC PANEL
ALT: 22 [IU]/L (ref 0–32)
AST: 29 [IU]/L (ref 0–40)
Albumin: 4.4 g/dL (ref 3.8–4.8)
Alkaline Phosphatase: 52 [IU]/L (ref 44–121)
BUN/Creatinine Ratio: 23 (ref 12–28)
BUN: 18 mg/dL (ref 8–27)
Bilirubin Total: 0.4 mg/dL (ref 0.0–1.2)
CO2: 25 mmol/L (ref 20–29)
Calcium: 9.1 mg/dL (ref 8.7–10.3)
Chloride: 102 mmol/L (ref 96–106)
Creatinine, Ser: 0.77 mg/dL (ref 0.57–1.00)
Globulin, Total: 2.5 g/dL (ref 1.5–4.5)
Glucose: 101 mg/dL — ABNORMAL HIGH (ref 70–99)
Potassium: 4.5 mmol/L (ref 3.5–5.2)
Sodium: 139 mmol/L (ref 134–144)
Total Protein: 6.9 g/dL (ref 6.0–8.5)
eGFR: 80 mL/min/{1.73_m2} (ref >59)

## 2023-10-16 DIAGNOSIS — L570 Actinic keratosis: Secondary | ICD-10-CM | POA: Diagnosis not present

## 2023-10-17 DIAGNOSIS — R3912 Poor urinary stream: Secondary | ICD-10-CM | POA: Diagnosis not present

## 2023-10-17 DIAGNOSIS — Z961 Presence of intraocular lens: Secondary | ICD-10-CM | POA: Diagnosis not present

## 2023-10-17 DIAGNOSIS — N390 Urinary tract infection, site not specified: Secondary | ICD-10-CM | POA: Diagnosis not present

## 2023-10-17 DIAGNOSIS — R339 Retention of urine, unspecified: Secondary | ICD-10-CM | POA: Diagnosis not present

## 2023-10-17 DIAGNOSIS — H5203 Hypermetropia, bilateral: Secondary | ICD-10-CM | POA: Diagnosis not present

## 2023-10-17 DIAGNOSIS — H26492 Other secondary cataract, left eye: Secondary | ICD-10-CM | POA: Diagnosis not present

## 2023-10-17 DIAGNOSIS — N3281 Overactive bladder: Secondary | ICD-10-CM | POA: Diagnosis not present

## 2023-10-17 DIAGNOSIS — I10 Essential (primary) hypertension: Secondary | ICD-10-CM | POA: Diagnosis not present

## 2023-10-18 NOTE — Progress Notes (Signed)
Pt has been made aware of normal result and verbalized understanding.  jw

## 2023-11-02 DIAGNOSIS — J3089 Other allergic rhinitis: Secondary | ICD-10-CM | POA: Diagnosis not present

## 2023-11-13 ENCOUNTER — Ambulatory Visit: Payer: Medicare Other | Admitting: Cardiology

## 2023-11-14 DIAGNOSIS — J019 Acute sinusitis, unspecified: Secondary | ICD-10-CM | POA: Diagnosis not present

## 2023-11-14 DIAGNOSIS — H1045 Other chronic allergic conjunctivitis: Secondary | ICD-10-CM | POA: Diagnosis not present

## 2023-11-14 DIAGNOSIS — J3089 Other allergic rhinitis: Secondary | ICD-10-CM | POA: Diagnosis not present

## 2023-11-14 DIAGNOSIS — J4530 Mild persistent asthma, uncomplicated: Secondary | ICD-10-CM | POA: Diagnosis not present

## 2023-11-15 ENCOUNTER — Telehealth: Payer: Self-pay | Admitting: Cardiology

## 2023-11-15 ENCOUNTER — Other Ambulatory Visit: Payer: Self-pay

## 2023-11-15 DIAGNOSIS — E782 Mixed hyperlipidemia: Secondary | ICD-10-CM

## 2023-11-15 DIAGNOSIS — I10 Essential (primary) hypertension: Secondary | ICD-10-CM

## 2023-11-15 DIAGNOSIS — I2510 Atherosclerotic heart disease of native coronary artery without angina pectoris: Secondary | ICD-10-CM

## 2023-11-15 MED ORDER — LOSARTAN POTASSIUM 25 MG PO TABS: 12.5000 mg | ORAL_TABLET | Freq: Every day | ORAL | 2 refills | Status: DC

## 2023-11-15 NOTE — Telephone Encounter (Signed)
*  STAT* If patient is at the pharmacy, call can be transferred to refill team.   1. Which medications need to be refilled? (please list name of each medication and dose if known)   losartan (COZAAR) 25 MG tablet    Take 0.5 tablets (12.5 mg total) by mouth daily. Please keep upcominig appt with Dr. Shari Prows in July 2023 before anymore refills. Thank you Final attempt    rosuvastatin (CRESTOR) 10 MG tablet   Take 1 tablet (10 mg total) by mouth daily.   2. Would you like to learn more about the convenience, safety, & potential cost savings by using the [facility]? No  3. Are you open to using the [facility] No  4. Which pharmacy/location (including street and city if local pharmacy) is medication to be sent to? [facility] [id number] - [place], [place] - [address]    5. Do they need a 30 day or 90 day supply? 90 Day Supply.

## 2023-11-15 NOTE — Telephone Encounter (Signed)
Losartan sent to requested Pharmacy. Rosuvastatin was sent in as year supply in October 2024

## 2023-11-24 DIAGNOSIS — R351 Nocturia: Secondary | ICD-10-CM | POA: Diagnosis not present

## 2023-11-24 DIAGNOSIS — R350 Frequency of micturition: Secondary | ICD-10-CM | POA: Diagnosis not present

## 2023-11-24 DIAGNOSIS — R3914 Feeling of incomplete bladder emptying: Secondary | ICD-10-CM | POA: Diagnosis not present

## 2023-11-30 DIAGNOSIS — M545 Low back pain, unspecified: Secondary | ICD-10-CM | POA: Diagnosis not present

## 2023-11-30 DIAGNOSIS — M542 Cervicalgia: Secondary | ICD-10-CM | POA: Diagnosis not present

## 2023-11-30 DIAGNOSIS — M25511 Pain in right shoulder: Secondary | ICD-10-CM | POA: Diagnosis not present

## 2023-11-30 DIAGNOSIS — M25551 Pain in right hip: Secondary | ICD-10-CM | POA: Diagnosis not present

## 2023-12-11 ENCOUNTER — Ambulatory Visit (HOSPITAL_BASED_OUTPATIENT_CLINIC_OR_DEPARTMENT_OTHER): Payer: Medicare Other | Admitting: Obstetrics & Gynecology

## 2023-12-11 ENCOUNTER — Encounter (HOSPITAL_BASED_OUTPATIENT_CLINIC_OR_DEPARTMENT_OTHER): Payer: Self-pay | Admitting: Obstetrics & Gynecology

## 2023-12-11 VITALS — BP 135/75 | HR 60 | Ht 63.5 in | Wt 110.6 lb

## 2023-12-11 DIAGNOSIS — Z01419 Encounter for gynecological examination (general) (routine) without abnormal findings: Secondary | ICD-10-CM | POA: Diagnosis not present

## 2023-12-11 DIAGNOSIS — N904 Leukoplakia of vulva: Secondary | ICD-10-CM | POA: Diagnosis not present

## 2023-12-11 DIAGNOSIS — M810 Age-related osteoporosis without current pathological fracture: Secondary | ICD-10-CM

## 2023-12-11 DIAGNOSIS — N952 Postmenopausal atrophic vaginitis: Secondary | ICD-10-CM

## 2023-12-11 MED ORDER — ESTRADIOL 0.1 MG/GM VA CREA: TOPICAL_CREAM | VAGINAL | 1 refills | Status: DC

## 2023-12-11 NOTE — Patient Instructions (Signed)
Call [phone] to scheduled at [facility], [address], [address].  [place], [place] [address]   

## 2023-12-11 NOTE — Progress Notes (Signed)
77 y.o. G32P2002 Divorced White or Caucasian female here for breast and pelvic exam.  I am also following her for osteoporosis.  She does not want to be on medication for this.  Exercising regularly.  Wants to repeat this year.  Order placed.  Denies vaginal bleeding.  Is feeling a lot of vaginal dryness.  Having some issues with urinary urgency.  Urodynamics have been recommended.  Saw Dr. Arita Miss.    No LMP recorded. Patient is postmenopausal.          Sexually active: No.  H/O STD:  no  Health Maintenance: PCP:  Dr. Felipa Eth.  Last wellness appt was this past year.  Did blood work at that appt:  yes Vaccines are up to date:  yes Colonoscopy:  2018.  Dr. Marina Goodell did not recommend follow up screening.  MMG:  10/09/2023 Negative BMD:  08/08/2022 Last pap smear:  09/08/2020 Negative.   H/o abnormal pap smear:  not obtained today due to guidelines    reports that she quit smoking about 35 years ago. Her smoking use included cigarettes. She started smoking about 55 years ago. She has never used smokeless tobacco. She reports current alcohol use of about 7.0 standard drinks of alcohol per week. She reports that she does not use drugs.  Past Medical History:  Diagnosis Date   Allergic rhinitis    Anxiety    Arthritis    Asthma    Atypical chest pain    Possibly from DES   Back pain    Cataract    Hx; of   Diverticulosis    Left   Elevated cholesterol    GERD (gastroesophageal reflux disease)    Hemoptysis 2007   From reflux pharyngitis   History of nuclear stress test    Myoview 5/17: EF 70%, no scar or ischemia, excellent exercise capacity, low risk   Hypertension    Insomnia    Lactose intolerance    Lichen sclerosus et atrophicus of the vulva 2008   Biopsy-proven   Nocturia    Osteoporosis 10/2015   T score -2.5 left femoral neck   Pneumonia    Raynaud's syndrome    "possible"    Past Surgical History:  Procedure Laterality Date   BREAST SURGERY  2001   REDUCTION  MAMMAPLASTY   COLONOSCOPY     FOOT SURGERY  2009   HYSTEROSCOPY  2003   HYSTEROSCOPIC RESECTION OF MYOMA   INGUINAL HERNIA REPAIR  1971   BILATERAL   LUMBAR LAMINECTOMY/DECOMPRESSION MICRODISCECTOMY Right 08/30/2013   Procedure: Right Lumbar four-five Extraforaminal Diskectomy;  Surgeon: Hewitt Shorts, MD;  Location: [facility];  Service: Neurosurgery;  Laterality: Right;   MANDIBLE SURGERY     MYOMECTOMY     TONSILLECTOMY     TUBAL LIGATION  1987    Current Outpatient Medications  Medication Sig Dispense Refill   Ascorbic Acid (VITAMIN C) 1000 MG tablet Take 3,000 mg by mouth daily.     aspirin EC 81 MG tablet Take 1 tablet (81 mg total) by mouth every Monday, Wednesday, and Friday. Swallow whole.     Calcium Carbonate-Vitamin D (CALCIUM + D PO) Take 1,200 mg by mouth.     Cholecalciferol (VITAMIN D PO) Take 2,000 Units by mouth daily.      EPINEPHrine 0.3 mg/0.3 mL IJ SOAJ injection USE AS DIRECTED AS NEEDED SYSTEMIC REACTION     ezetimibe (ZETIA) 10 MG tablet Take 1 tablet (10 mg total) by mouth daily. 90 tablet  3   fluticasone (FLONASE) 50 MCG/ACT nasal spray Place 1 spray into both nostrils as needed.      Glucosamine-Chondroitin-MSM 500-200-150 MG TABS Take 1 tablet by mouth 2 (two) times daily.     losartan (COZAAR) 25 MG tablet Take 0.5 tablets (12.5 mg total) by mouth daily. Please keep upcominig appt with Dr. Shari Prows in July 2023 before anymore refills. Thank you Final attempt 45 tablet 2   MAGNESIUM PO Take 1 capsule by mouth 3 (three) times a week. OTC supplement     MELATONIN PO Take 1 mg by mouth every other day. gummy     meloxicam (MOBIC) 7.5 MG tablet Take 7.5 mg by mouth as needed for pain.     omeprazole (PRILOSEC) 40 MG capsule TAKE 1 CAPSULE BY MOUTH DAILY **MUST CALL MD FOR APPOINTMENT 90 capsule 3   Probiotic Product (PROBIOTIC DAILY PO) Take 1 tablet by mouth 2 (two) times daily.      rosuvastatin (CRESTOR) 10 MG tablet Take 1 tablet (10 mg total) by  mouth daily. 90 tablet 3   sertraline (ZOLOFT) 25 MG tablet Take 25 mg by mouth daily.     TURMERIC PO Take 1 tablet by mouth 2 (two) times daily.      No current facility-administered medications for this visit.    Family History  Problem Relation Age of Onset   Heart disease Mother        died at 69; ? MI   Hypertension Mother    Diabetes Mother    Multiple myeloma Father    Heart disease Paternal Grandfather        age 61 - died of MI   Breast cancer Maternal Aunt        Age 103's   Heart disease Maternal Uncle     Review of Systems  Constitutional: Negative.   Genitourinary: Negative.     Exam:   BP 135/75 (BP Location: Right Arm, Patient Position: Sitting, Cuff Size: Normal)   Pulse 60   Ht 5' 3.5" (1.613 m)   Wt 110 lb 9.6 oz (50.2 kg)   BMI 19.28 kg/m   Height: 5' 3.5" (161.3 cm)  General appearance: alert, cooperative and appears stated age Breasts: normal appearance, no masses or tenderness Abdomen: soft, non-tender; bowel sounds normal; no masses,  no organomegaly Lymph nodes: Cervical, supraclavicular, and axillary nodes normal.  No abnormal inguinal nodes palpated Neurologic: Grossly normal  Pelvic: External genitalia:  no lesions              Urethra:  normal appearing urethra with no masses, tenderness or lesions              Bartholins and Skenes: normal                 Vagina: normal appearing vagina with atrophic changes and no discharge, no lesions              Cervix: no lesions              Pap taken: No. Bimanual Exam:  Uterus:  normal size, contour, position, consistency, mobility, non-tender              Adnexa: no mass, fullness, tenderness               Rectovaginal: Confirms               Anus:  normal sphincter tone, no lesions  Chaperone, Ina Homes, CMA, was present for  exam.  Assessment/Plan: 1. Encntr for gyn exam (general) (routine) w/o abn findings (Primary) - Pap smear 2021.  Not indicated today. - Mammogram 09/2023 -  Colonoscopy 06/2017 - Bone mineral density ordered - lab work done with PCP, Dr. Felipa Eth - vaccines reviewed/updated  2. Age-related osteoporosis without current pathological fracture - DG BONE DENSITY (DXA); Future - she is taking supplemental calcium 600mg  with Vit D  3. Vaginal atrophy - will start vaginal estrogen cream to see if will help with urinary symptoms.  Advised to call back if expensive as will have compounded.  Also advised to call if has breast tenderness. - estradiol (ESTRACE) 0.1 MG/GM vaginal cream; 1 gram vaginally twice weekly  Dispense: 42.5 g; Refill: 1  4. Lichen sclerosus et atrophicus of the vulva - normal skin exam today

## 2023-12-19 DIAGNOSIS — J3081 Allergic rhinitis due to animal (cat) (dog) hair and dander: Secondary | ICD-10-CM | POA: Diagnosis not present

## 2023-12-19 DIAGNOSIS — J3089 Other allergic rhinitis: Secondary | ICD-10-CM | POA: Diagnosis not present

## 2023-12-20 ENCOUNTER — Ambulatory Visit (HOSPITAL_BASED_OUTPATIENT_CLINIC_OR_DEPARTMENT_OTHER): Payer: Medicare Other | Admitting: Obstetrics & Gynecology

## 2023-12-26 DIAGNOSIS — J3081 Allergic rhinitis due to animal (cat) (dog) hair and dander: Secondary | ICD-10-CM | POA: Diagnosis not present

## 2023-12-26 DIAGNOSIS — J3089 Other allergic rhinitis: Secondary | ICD-10-CM | POA: Diagnosis not present

## 2023-12-26 DIAGNOSIS — J301 Allergic rhinitis due to pollen: Secondary | ICD-10-CM | POA: Diagnosis not present

## 2024-01-01 DIAGNOSIS — J3089 Other allergic rhinitis: Secondary | ICD-10-CM | POA: Diagnosis not present

## 2024-01-01 DIAGNOSIS — J3081 Allergic rhinitis due to animal (cat) (dog) hair and dander: Secondary | ICD-10-CM | POA: Diagnosis not present

## 2024-01-01 DIAGNOSIS — J301 Allergic rhinitis due to pollen: Secondary | ICD-10-CM | POA: Diagnosis not present

## 2024-01-08 DIAGNOSIS — J301 Allergic rhinitis due to pollen: Secondary | ICD-10-CM | POA: Diagnosis not present

## 2024-01-08 DIAGNOSIS — J3089 Other allergic rhinitis: Secondary | ICD-10-CM | POA: Diagnosis not present

## 2024-01-08 DIAGNOSIS — J3081 Allergic rhinitis due to animal (cat) (dog) hair and dander: Secondary | ICD-10-CM | POA: Diagnosis not present

## 2024-01-13 DIAGNOSIS — M25511 Pain in right shoulder: Secondary | ICD-10-CM | POA: Diagnosis not present

## 2024-01-13 DIAGNOSIS — M25551 Pain in right hip: Secondary | ICD-10-CM | POA: Diagnosis not present

## 2024-01-13 DIAGNOSIS — M542 Cervicalgia: Secondary | ICD-10-CM | POA: Diagnosis not present

## 2024-01-13 DIAGNOSIS — M545 Low back pain, unspecified: Secondary | ICD-10-CM | POA: Diagnosis not present

## 2024-01-15 DIAGNOSIS — J3081 Allergic rhinitis due to animal (cat) (dog) hair and dander: Secondary | ICD-10-CM | POA: Diagnosis not present

## 2024-01-15 DIAGNOSIS — J3089 Other allergic rhinitis: Secondary | ICD-10-CM | POA: Diagnosis not present

## 2024-01-22 DIAGNOSIS — J3081 Allergic rhinitis due to animal (cat) (dog) hair and dander: Secondary | ICD-10-CM | POA: Diagnosis not present

## 2024-01-22 DIAGNOSIS — J301 Allergic rhinitis due to pollen: Secondary | ICD-10-CM | POA: Diagnosis not present

## 2024-01-22 DIAGNOSIS — J3089 Other allergic rhinitis: Secondary | ICD-10-CM | POA: Diagnosis not present

## 2024-02-02 DIAGNOSIS — J3089 Other allergic rhinitis: Secondary | ICD-10-CM | POA: Diagnosis not present

## 2024-02-05 DIAGNOSIS — M545 Low back pain, unspecified: Secondary | ICD-10-CM | POA: Diagnosis not present

## 2024-02-05 DIAGNOSIS — M542 Cervicalgia: Secondary | ICD-10-CM | POA: Diagnosis not present

## 2024-02-05 DIAGNOSIS — M25511 Pain in right shoulder: Secondary | ICD-10-CM | POA: Diagnosis not present

## 2024-02-05 DIAGNOSIS — M25551 Pain in right hip: Secondary | ICD-10-CM | POA: Diagnosis not present

## 2024-02-07 ENCOUNTER — Encounter: Payer: Self-pay | Admitting: Cardiology

## 2024-02-07 ENCOUNTER — Ambulatory Visit: Payer: Medicare Other | Attending: Cardiology | Admitting: Cardiology

## 2024-02-07 VITALS — BP 122/70 | HR 58 | Ht 63.0 in | Wt 111.6 lb

## 2024-02-07 DIAGNOSIS — R931 Abnormal findings on diagnostic imaging of heart and coronary circulation: Secondary | ICD-10-CM | POA: Diagnosis not present

## 2024-02-07 DIAGNOSIS — E782 Mixed hyperlipidemia: Secondary | ICD-10-CM | POA: Diagnosis not present

## 2024-02-07 DIAGNOSIS — I2510 Atherosclerotic heart disease of native coronary artery without angina pectoris: Secondary | ICD-10-CM | POA: Insufficient documentation

## 2024-02-07 DIAGNOSIS — J3089 Other allergic rhinitis: Secondary | ICD-10-CM | POA: Diagnosis not present

## 2024-02-07 DIAGNOSIS — I10 Essential (primary) hypertension: Secondary | ICD-10-CM | POA: Diagnosis not present

## 2024-02-07 DIAGNOSIS — J3081 Allergic rhinitis due to animal (cat) (dog) hair and dander: Secondary | ICD-10-CM | POA: Diagnosis not present

## 2024-02-07 DIAGNOSIS — J301 Allergic rhinitis due to pollen: Secondary | ICD-10-CM | POA: Diagnosis not present

## 2024-02-07 NOTE — Progress Notes (Signed)
 Cardiology Office Note:  .   Date:  02/11/2024  ID:  Madison Kramer, DOB 11-13-1946, MRN [id number] PCP: Chilton Greathouse, MD  [facility] Providers Cardiologist:  Yates Decamp, MD   History of Present Illness: .   Madison Kramer is a 78 y.o. Patient with elevated coronary calcium score in the 87 percentile in 2019, hypercholesterolemia, coronary CTA revealing mild to moderate disease FFR negative, hypertension, presents to establish care with me.  Discussed the use of AI scribe software for clinical note transcription with the patient, who gave verbal consent to proceed.  History of Present Illness   The patient, with a history of hypercholesterolemia and hypertension, presents to establish care with a new cardiologist. She has a family history of heart disease, with her mother passing away at the age of 3. Despite leading an active lifestyle and maintaining a healthy weight, the patient is concerned about her heart health due to her genetic predisposition. She reports working out five to six days a week and consuming a drink every night.  In 2019, the patient underwent a coronary calcium score test, which placed her in the 87th percentile. A coronary CT angiogram revealed mild to moderate disease, but the FFR was negative. The patient was subsequently placed on cholesterol medication and losartan for blood pressure control. She is interested in knowing if her current medication regimen is effective and if she needs to undergo further testing.       Labs   Lab Results  Component Value Date   CHOL 152 10/13/2023   HDL 68 10/13/2023   LDLCALC 64 10/13/2023   TRIG 114 10/13/2023   CHOLHDL 2.2 10/13/2023   Lab Results  Component Value Date   NA 139 10/13/2023   K 4.5 10/13/2023   CO2 25 10/13/2023   GLUCOSE 101 (H) 10/13/2023   BUN 18 10/13/2023   CREATININE 0.77 10/13/2023   CALCIUM 9.1 10/13/2023   EGFR 80 10/13/2023   GFRNONAA 78 02/03/2020      Latest Ref Rng & Units  10/13/2023    9:21 AM 09/19/2022    3:22 PM 02/03/2020   11:36 AM  BMP  Glucose 70 - 99 mg/dL 132  440  90   BUN 8 - 27 mg/dL 18  21  28    Creatinine 0.57 - 1.00 mg/dL 1.02  7.25  3.66   BUN/Creat Ratio 12 - 28 23  29   37   Sodium 134 - 144 mmol/L 139  141  138   Potassium 3.5 - 5.2 mmol/L 4.5  4.7  4.6   Chloride 96 - 106 mmol/L 102  103  102   CO2 20 - 29 mmol/L 25  22  23    Calcium 8.7 - 10.3 mg/dL 9.1  9.4  9.3       Latest Ref Rng & Units 02/03/2020   11:36 AM 07/03/2017    7:58 AM 08/28/2013   10:01 AM  CBC  WBC 3.4 - 10.8 x10E3/uL 4.5  4.4  10.7   Hemoglobin 11.1 - 15.9 g/dL 44.0  34.7  42.5   Hematocrit 34.0 - 46.6 % 39.7  40.2  44.9   Platelets 150 - 450 x10E3/uL 292  269  338    No results found for: "HGBA1C"  Lab Results  Component Value Date   TSH 1.760 09/19/2022    Lipoprotein (a) 02/07/2024 70.7   ROS Physical Exam:   VS:  BP 122/70 (BP Location: Right Arm, Patient Position: Sitting, Cuff  Size: Normal)   Pulse (!) 58   Ht 5\' 3"  (1.6 m)   Wt 111 lb 9.6 oz (50.6 kg)   SpO2 96%   BMI 19.77 kg/m    Wt Readings from Last 3 Encounters:  02/07/24 111 lb 9.6 oz (50.6 kg)  12/11/23 110 lb 9.6 oz (50.2 kg)  10/10/23 110 lb 6.4 oz (50.1 kg)    Physical Exam Studies Reviewed: Marland Kitchen    Coronary CTA as dictated below, myocardial perfusion scan from 2017 which was normal. EKG:    EKG 10/10/2023: Ectopic atrial rhythm at the rate of 67 bpm otherwise normal EKG.  Compared to 06/20/2022, sinus rhythm has been replaced.  Medications and allergies    Allergies  Allergen Reactions   Atorvastatin Other (See Comments)    Pt reports causes lower extremity muscle cramps   Codeine Nausea Only    Other Reaction(s): GI Intolerance   Erythromycin Other (See Comments) and Nausea And Vomiting    Stomach cramping  Other Reaction(s): Other (See Comments)   Macrobid [Nitrofurantoin] Nausea Only    headache   Sulfa Antibiotics     As a child   Sulfamethoxazole Other (See  Comments)    Unsdure of reaction. Had as a child.   Sulfonamide Derivatives    Tetracyclines & Related     unknown   Tramadol Nausea Only and Other (See Comments)    Dizziness and mind racing     Current Outpatient Medications:    Ascorbic Acid (VITAMIN C) 1000 MG tablet, Take 3,000 mg by mouth daily., Disp: , Rfl:    Calcium Carbonate-Vitamin D (CALCIUM + D PO), Take 1,200 mg by mouth., Disp: , Rfl:    Cholecalciferol (VITAMIN D PO), Take 2,000 Units by mouth daily. , Disp: , Rfl:    EPINEPHrine 0.3 mg/0.3 mL IJ SOAJ injection, USE AS DIRECTED AS NEEDED SYSTEMIC REACTION, Disp: , Rfl:    estradiol (ESTRACE) 0.1 MG/GM vaginal cream, 1 gram vaginally twice weekly, Disp: 42.5 g, Rfl: 1   ezetimibe (ZETIA) 10 MG tablet, Take 1 tablet (10 mg total) by mouth daily., Disp: 90 tablet, Rfl: 3   fluticasone (FLONASE) 50 MCG/ACT nasal spray, Place 1 spray into both nostrils as needed. , Disp: , Rfl:    Glucosamine-Chondroitin-MSM 500-200-150 MG TABS, Take 1 tablet by mouth 2 (two) times daily., Disp: , Rfl:    losartan (COZAAR) 25 MG tablet, Take 0.5 tablets (12.5 mg total) by mouth daily. Please keep upcominig appt with Dr. Shari Prows in July 2023 before anymore refills. Thank you Final attempt, Disp: 45 tablet, Rfl: 2   MAGNESIUM PO, Take 1 capsule by mouth 3 (three) times a week. OTC supplement, Disp: , Rfl:    MELATONIN PO, Take 1 mg by mouth every other day. gummy, Disp: , Rfl:    meloxicam (MOBIC) 7.5 MG tablet, Take 7.5 mg by mouth as needed for pain., Disp: , Rfl:    omeprazole (PRILOSEC) 40 MG capsule, TAKE 1 CAPSULE BY MOUTH DAILY **MUST CALL MD FOR APPOINTMENT, Disp: 90 capsule, Rfl: 3   Probiotic Product (PROBIOTIC DAILY PO), Take 1 tablet by mouth 2 (two) times daily. , Disp: , Rfl:    rosuvastatin (CRESTOR) 10 MG tablet, Take 1 tablet (10 mg total) by mouth daily., Disp: 90 tablet, Rfl: 3   sertraline (ZOLOFT) 25 MG tablet, Take 25 mg by mouth daily., Disp: , Rfl:    TURMERIC PO, Take 1  tablet by mouth 2 (two) times daily. , Disp: ,  Rfl:    aspirin EC 81 MG tablet, Take 1 tablet (81 mg total) by mouth every Monday, Wednesday, and Friday. Swallow whole. (Patient not taking: Reported on 02/07/2024), Disp: , Rfl:    ASSESSMENT AND PLAN: .      ICD-10-CM   1. Elevated coronary artery calcium score CCTA 11/30/2018: Coronary calcium score of 255. This was 16 percentile for age and sex matched control.  R93.1 Lipoprotein A (LPA)    2. Coronary artery disease involving native coronary artery of native heart without angina pectoris  I25.10 Lipoprotein A (LPA)    3. Mixed hyperlipidemia  E78.2 Lipoprotein A (LPA)    4. Essential hypertension  I10 Lipoprotein A (LPA)      Assessment and Plan    Coronary Artery Disease (CAD) The coronary calcium score is 255 in the 87th percentile as of 2019, with a coronary CT angiogram showing 30-40% blockage, which is not physiologically significant. Cholesterol medication and losartan are currently prescribed. A very active lifestyle is maintained with exercise 5-6 days a week. Further coronary calcium scoring would not alter management. The risk of heart attack and stroke is very low due to effective risk factor management, with an event rate of 1-2% over five years for similar patients. If a heart attack occurs, it would likely be minor due to current treatment. Continue current cholesterol medication and losartan.  LPA blood test reviewed within normal limits.  Patient clinically has no coronary artery disease as there is no stenosis >50% in the coronary arteries.  Hypercholesterolemia LDL cholesterol is 64 mg/dL, below the target of 70 mg/dL. HDL cholesterol is 68 mg/dL, lower than previous levels but within a healthy range. Statin therapy likely contributes to lower HDL levels. Overall cholesterol management is effective. Continue current statin therapy and monitor cholesterol levels annually.  Lp(a) is also normal, checked  today  Hypertension Blood pressure is well-controlled on losartan, with no signs of complications such as heart murmurs or vascular issues. Kidney and liver functions are normal, indicating good tolerance to the medication. Continue losartan and monitor blood pressure and kidney/liver function annually.  General Health Maintenance At 78 years old, a very active lifestyle is maintained, including a healthy diet and regular physical activity. Consuming a glass of wine or vodka daily is deemed acceptable. There is a concern about staying on top of health due to a family history of heart disease, but health risks are managed exceptionally well. Schedule an annual check-up.  She has no indication for follow-up however patient prefers to have annual follow-up for reassurance.  She is on very good primary preventative therapy, she would like to be on top of her health, she is presently 78 years of age, continues to exercise regularly, has excellent primary care, on appropriate medical therapy.  Her EKG does show ectopic atrial rhythm about 3 months ago when she was seen in our office however she is completely asymptomatic.  Hence I did not repeat EKG, no evaluation is indicated in the absence of any symptoms.  She has no palpitations, no dizziness or syncope, we will continue to monitor this clinically.  I see that she is on calcium supplement, unless this is being prescribed by PCP, for osteoporosis/osteopenia, would recommend discontinuing this, will forward my note to her PCP.  Follow-up Schedule a follow-up visit in one year and review LPA test results on MyChart.          Signed,  Yates Decamp, MD, [facility] 02/11/2024, 5:46 AM [facility]  [address] [place], [place] [address] Phone: [phone]. Fax:  [phone]

## 2024-02-07 NOTE — Patient Instructions (Signed)
 Medication Instructions:   *If you need a refill on your cardiac medications before your next appointment, please call your pharmacy*   Lab Work: lipo a   If you have labs (blood work) drawn today and your tests are completely normal, you will receive your results only by: MyChart Message (if you have MyChart) OR A paper copy in the mail If you have any lab test that is abnormal or we need to change your treatment, we will call you to review the results.   Testing/Procedures:    Follow-Up: At [facility], you and your health needs are our priority.  As part of our continuing mission to provide you with exceptional heart care, we have created designated Provider Care Teams.  These Care Teams include your primary Cardiologist (physician) and Advanced Practice Providers (APPs -  Physician Assistants and Nurse Practitioners) who all work together to provide you with the care you need, when you need it.  We recommend signing up for the patient portal called "MyChart".  Sign up information is provided on this After Visit Summary.  MyChart is used to connect with patients for Virtual Visits (Telemedicine).  Patients are able to view lab/test results, encounter notes, upcoming appointments, etc.  Non-urgent messages can be sent to your provider as well.   To learn more about what you can do with MyChart, go to [email or web address].    Your next appointment:   1 year(s)  Provider:   Yates Decamp, MD     Other Instructions

## 2024-02-08 LAB — LIPOPROTEIN A (LPA): Lipoprotein (a): 70.7 nmol/L (ref <75.0)

## 2024-02-10 ENCOUNTER — Encounter: Payer: Self-pay | Admitting: Cardiology

## 2024-02-16 DIAGNOSIS — R110 Nausea: Secondary | ICD-10-CM | POA: Diagnosis not present

## 2024-02-16 DIAGNOSIS — N3001 Acute cystitis with hematuria: Secondary | ICD-10-CM | POA: Diagnosis not present

## 2024-02-16 DIAGNOSIS — I2510 Atherosclerotic heart disease of native coronary artery without angina pectoris: Secondary | ICD-10-CM | POA: Diagnosis not present

## 2024-02-16 DIAGNOSIS — R42 Dizziness and giddiness: Secondary | ICD-10-CM | POA: Diagnosis not present

## 2024-02-16 DIAGNOSIS — N390 Urinary tract infection, site not specified: Secondary | ICD-10-CM | POA: Diagnosis not present

## 2024-02-16 DIAGNOSIS — I10 Essential (primary) hypertension: Secondary | ICD-10-CM | POA: Diagnosis not present

## 2024-03-04 DIAGNOSIS — R3914 Feeling of incomplete bladder emptying: Secondary | ICD-10-CM | POA: Diagnosis not present

## 2024-03-04 DIAGNOSIS — R350 Frequency of micturition: Secondary | ICD-10-CM | POA: Diagnosis not present

## 2024-03-04 DIAGNOSIS — R351 Nocturia: Secondary | ICD-10-CM | POA: Diagnosis not present

## 2024-03-06 DIAGNOSIS — J3089 Other allergic rhinitis: Secondary | ICD-10-CM | POA: Diagnosis not present

## 2024-03-13 DIAGNOSIS — R350 Frequency of micturition: Secondary | ICD-10-CM | POA: Diagnosis not present

## 2024-03-13 DIAGNOSIS — R3914 Feeling of incomplete bladder emptying: Secondary | ICD-10-CM | POA: Diagnosis not present

## 2024-03-27 DIAGNOSIS — R351 Nocturia: Secondary | ICD-10-CM | POA: Diagnosis not present

## 2024-03-27 DIAGNOSIS — R350 Frequency of micturition: Secondary | ICD-10-CM | POA: Diagnosis not present

## 2024-03-27 DIAGNOSIS — R3914 Feeling of incomplete bladder emptying: Secondary | ICD-10-CM | POA: Diagnosis not present

## 2024-04-03 DIAGNOSIS — J3089 Other allergic rhinitis: Secondary | ICD-10-CM | POA: Diagnosis not present

## 2024-04-03 DIAGNOSIS — J3081 Allergic rhinitis due to animal (cat) (dog) hair and dander: Secondary | ICD-10-CM | POA: Diagnosis not present

## 2024-04-03 DIAGNOSIS — J301 Allergic rhinitis due to pollen: Secondary | ICD-10-CM | POA: Diagnosis not present

## 2024-04-05 DIAGNOSIS — M25562 Pain in left knee: Secondary | ICD-10-CM | POA: Diagnosis not present

## 2024-04-24 DIAGNOSIS — E559 Vitamin D deficiency, unspecified: Secondary | ICD-10-CM | POA: Diagnosis not present

## 2024-04-24 DIAGNOSIS — E7800 Pure hypercholesterolemia, unspecified: Secondary | ICD-10-CM | POA: Diagnosis not present

## 2024-04-24 DIAGNOSIS — M810 Age-related osteoporosis without current pathological fracture: Secondary | ICD-10-CM | POA: Diagnosis not present

## 2024-04-24 DIAGNOSIS — I10 Essential (primary) hypertension: Secondary | ICD-10-CM | POA: Diagnosis not present

## 2024-05-01 DIAGNOSIS — Z1339 Encounter for screening examination for other mental health and behavioral disorders: Secondary | ICD-10-CM | POA: Diagnosis not present

## 2024-05-01 DIAGNOSIS — K219 Gastro-esophageal reflux disease without esophagitis: Secondary | ICD-10-CM | POA: Diagnosis not present

## 2024-05-01 DIAGNOSIS — E559 Vitamin D deficiency, unspecified: Secondary | ICD-10-CM | POA: Diagnosis not present

## 2024-05-01 DIAGNOSIS — M810 Age-related osteoporosis without current pathological fracture: Secondary | ICD-10-CM | POA: Diagnosis not present

## 2024-05-01 DIAGNOSIS — N3281 Overactive bladder: Secondary | ICD-10-CM | POA: Diagnosis not present

## 2024-05-01 DIAGNOSIS — I10 Essential (primary) hypertension: Secondary | ICD-10-CM | POA: Diagnosis not present

## 2024-05-01 DIAGNOSIS — J309 Allergic rhinitis, unspecified: Secondary | ICD-10-CM | POA: Diagnosis not present

## 2024-05-01 DIAGNOSIS — J3089 Other allergic rhinitis: Secondary | ICD-10-CM | POA: Diagnosis not present

## 2024-05-01 DIAGNOSIS — M542 Cervicalgia: Secondary | ICD-10-CM | POA: Diagnosis not present

## 2024-05-01 DIAGNOSIS — E7800 Pure hypercholesterolemia, unspecified: Secondary | ICD-10-CM | POA: Diagnosis not present

## 2024-05-01 DIAGNOSIS — Z1331 Encounter for screening for depression: Secondary | ICD-10-CM | POA: Diagnosis not present

## 2024-05-01 DIAGNOSIS — I2510 Atherosclerotic heart disease of native coronary artery without angina pectoris: Secondary | ICD-10-CM | POA: Diagnosis not present

## 2024-05-01 DIAGNOSIS — Z0000 Encounter for general adult medical examination without abnormal findings: Secondary | ICD-10-CM | POA: Diagnosis not present

## 2024-05-01 DIAGNOSIS — R82998 Other abnormal findings in urine: Secondary | ICD-10-CM | POA: Diagnosis not present

## 2024-05-02 DIAGNOSIS — M542 Cervicalgia: Secondary | ICD-10-CM | POA: Diagnosis not present

## 2024-05-02 DIAGNOSIS — M545 Low back pain, unspecified: Secondary | ICD-10-CM | POA: Diagnosis not present

## 2024-05-02 DIAGNOSIS — M25562 Pain in left knee: Secondary | ICD-10-CM | POA: Diagnosis not present

## 2024-05-02 DIAGNOSIS — M25561 Pain in right knee: Secondary | ICD-10-CM | POA: Diagnosis not present

## 2024-05-02 DIAGNOSIS — M25511 Pain in right shoulder: Secondary | ICD-10-CM | POA: Diagnosis not present

## 2024-05-09 DIAGNOSIS — M23242 Derangement of anterior horn of lateral meniscus due to old tear or injury, left knee: Secondary | ICD-10-CM | POA: Diagnosis not present

## 2024-05-13 DIAGNOSIS — J019 Acute sinusitis, unspecified: Secondary | ICD-10-CM | POA: Diagnosis not present

## 2024-05-13 DIAGNOSIS — J4530 Mild persistent asthma, uncomplicated: Secondary | ICD-10-CM | POA: Diagnosis not present

## 2024-05-13 DIAGNOSIS — J3089 Other allergic rhinitis: Secondary | ICD-10-CM | POA: Diagnosis not present

## 2024-05-13 DIAGNOSIS — H1045 Other chronic allergic conjunctivitis: Secondary | ICD-10-CM | POA: Diagnosis not present

## 2024-05-20 DIAGNOSIS — M542 Cervicalgia: Secondary | ICD-10-CM | POA: Diagnosis not present

## 2024-05-20 DIAGNOSIS — M25561 Pain in right knee: Secondary | ICD-10-CM | POA: Diagnosis not present

## 2024-05-20 DIAGNOSIS — M25511 Pain in right shoulder: Secondary | ICD-10-CM | POA: Diagnosis not present

## 2024-05-20 DIAGNOSIS — M545 Low back pain, unspecified: Secondary | ICD-10-CM | POA: Diagnosis not present

## 2024-05-31 ENCOUNTER — Other Ambulatory Visit: Payer: Self-pay

## 2024-05-31 DIAGNOSIS — E782 Mixed hyperlipidemia: Secondary | ICD-10-CM

## 2024-05-31 DIAGNOSIS — I2510 Atherosclerotic heart disease of native coronary artery without angina pectoris: Secondary | ICD-10-CM

## 2024-05-31 DIAGNOSIS — I10 Essential (primary) hypertension: Secondary | ICD-10-CM

## 2024-05-31 MED ORDER — LOSARTAN POTASSIUM 25 MG PO TABS: 12.5000 mg | ORAL_TABLET | Freq: Every day | ORAL | 2 refills | Status: AC

## 2024-06-04 DIAGNOSIS — J301 Allergic rhinitis due to pollen: Secondary | ICD-10-CM | POA: Diagnosis not present

## 2024-06-04 DIAGNOSIS — J3081 Allergic rhinitis due to animal (cat) (dog) hair and dander: Secondary | ICD-10-CM | POA: Diagnosis not present

## 2024-06-04 DIAGNOSIS — J3089 Other allergic rhinitis: Secondary | ICD-10-CM | POA: Diagnosis not present

## 2024-06-14 DIAGNOSIS — S61219A Laceration without foreign body of unspecified finger without damage to nail, initial encounter: Secondary | ICD-10-CM | POA: Diagnosis not present

## 2024-06-27 ENCOUNTER — Other Ambulatory Visit (HOSPITAL_COMMUNITY)
Admission: RE | Admit: 2024-06-27 | Discharge: 2024-06-27 | Disposition: A | Discharge status: Home / Self Care | Source: Ambulatory Visit | Attending: Obstetrics & Gynecology | Admitting: Obstetrics & Gynecology

## 2024-06-27 ENCOUNTER — Ambulatory Visit (INDEPENDENT_AMBULATORY_CARE_PROVIDER_SITE_OTHER): Admitting: Obstetrics & Gynecology

## 2024-06-27 VITALS — BP 117/74 | HR 60 | Wt 112.0 lb

## 2024-06-27 DIAGNOSIS — N898 Other specified noninflammatory disorders of vagina: Secondary | ICD-10-CM

## 2024-06-27 DIAGNOSIS — R140 Abdominal distension (gaseous): Secondary | ICD-10-CM | POA: Diagnosis not present

## 2024-06-27 NOTE — Progress Notes (Unsigned)
 CC: Vaginal itching  HPI: 78 y.o. G98P2002 Divorced White or Caucasian female here for complaint of vaginal itching that started after being at the beach last week.  She got some vagisil and she started using it last night.  She's not sure that is the correct thing to be using so wants some instructions.  Probably unrelated, she's been having what feels like an episode of IBS.  Doesn't have these a lot but was at the beach and did eat some things she doesn't normally.  Feeling very bloated.  Knows this can be a sign of ovarian cancer.  Would like evaluation for this.     Past Medical History:  Diagnosis Date   Allergic rhinitis    Anxiety    Arthritis    Asthma    Atypical chest pain    Possibly from DES   Back pain    Cataract    Hx; of   Diverticulosis    Left   Elevated cholesterol    GERD (gastroesophageal reflux disease)    Hemoptysis 2007   From reflux pharyngitis   History of nuclear stress test    Myoview  5/17: EF 70%, no scar or ischemia, excellent exercise capacity, low risk   Hypertension    Insomnia    Lactose intolerance    Lichen sclerosus et atrophicus of the vulva 2008   Biopsy-proven   Nocturia    Osteoporosis 10/2015   T score -2.5 left femoral neck   Pneumonia    Raynaud's syndrome    possible    MEDS:   Current Outpatient Medications on File Prior to Visit  Medication Sig Dispense Refill   Ascorbic Acid (VITAMIN C) 1000 MG tablet Take 3,000 mg by mouth daily.     aspirin  EC 81 MG tablet Take 1 tablet (81 mg total) by mouth every Monday, Wednesday, and Friday. Swallow whole.     Calcium  Carbonate-Vitamin D  (CALCIUM  + D PO) Take 1,200 mg by mouth.     Cholecalciferol (VITAMIN D  PO) Take 2,000 Units by mouth daily.      EPINEPHrine  0.3 mg/0.3 mL IJ SOAJ injection USE AS DIRECTED AS NEEDED SYSTEMIC REACTION     estradiol  (ESTRACE ) 0.1 MG/GM vaginal cream 1 gram vaginally twice weekly 42.5 g 1   ezetimibe  (ZETIA ) 10 MG tablet Take 1 tablet (10 mg  total) by mouth daily. 90 tablet 3   fluticasone  (FLONASE ) 50 MCG/ACT nasal spray Place 1 spray into both nostrils as needed.      Glucosamine-Chondroitin-MSM 500-200-150 MG TABS Take 1 tablet by mouth 2 (two) times daily.     losartan  (COZAAR ) 25 MG tablet Take 0.5 tablets (12.5 mg total) by mouth daily. 45 tablet 2   MAGNESIUM  PO Take 1 capsule by mouth 3 (three) times a week. OTC supplement     MELATONIN PO Take 1 mg by mouth every other day. gummy     meloxicam (MOBIC) 7.5 MG tablet Take 7.5 mg by mouth as needed for pain.     omeprazole  (PRILOSEC) 40 MG capsule TAKE 1 CAPSULE BY MOUTH DAILY **MUST CALL MD FOR APPOINTMENT 90 capsule 3   Probiotic Product (PROBIOTIC DAILY PO) Take 1 tablet by mouth 2 (two) times daily.      rosuvastatin  (CRESTOR ) 10 MG tablet Take 1 tablet (10 mg total) by mouth daily. 90 tablet 3   sertraline (ZOLOFT) 25 MG tablet Take 25 mg by mouth daily.     TURMERIC PO Take 1 tablet by  mouth 2 (two) times daily.      No current facility-administered medications on file prior to visit.    ALLERGIES: Atorvastatin , Codeine, Erythromycin, Macrobid [nitrofurantoin], Sulfa antibiotics, Sulfamethoxazole, Sulfonamide derivatives, Tetracyclines & related, and Tramadol  SH:  divorced, non smoker  Review of Systems  Constitutional: Negative.   Gastrointestinal:  Negative for nausea and vomiting.  Genitourinary:  Negative for dysuria, frequency and urgency.       No vaginal bleeding    PHYSICAL EXAMINATION:    BP 117/74 (BP Location: Right Arm, Patient Position: Sitting, Cuff Size: Normal)   Pulse 60   Wt 112 lb (50.8 kg)   SpO2 96%   BMI 19.84 kg/m     General appearance: alert, cooperative and appears stated age  Abdomen: soft, non-tender; distended, no masses or organomegaly, no hernias or masses Lymph:  no inguinal LAD noted  Pelvic: External genitalia:  no lesions              Urethra:  normal appearing urethra with no masses, tenderness or lesions               Bartholins and Skenes: normal                 Vagina: atrophic mucosa with whitish and watery discharge              Cervix: no lesions              Bimanual Exam:  Uterus:  normal size, contour, position, consistency, mobility, non-tender              Adnexa: no mass, fullness, tenderness  Chaperone was present for exam.  Assessment/Plan: 1. Vaginal discharge (Primary) - advised to stop using Vagisil and get OTC monistat product 5 - 7 days and use externally as well. - Cervicovaginal ancillary only( [place])  2. Vaginal itching  3. Abdominal bloating - exam is normal but will have pt return for ultrasound to evaluate ovaries - US  PELVIS TRANSVAGINAL NON-OB (TV ONLY); Future

## 2024-06-28 ENCOUNTER — Ambulatory Visit (HOSPITAL_BASED_OUTPATIENT_CLINIC_OR_DEPARTMENT_OTHER): Payer: Self-pay | Admitting: Obstetrics & Gynecology

## 2024-06-28 ENCOUNTER — Encounter (HOSPITAL_BASED_OUTPATIENT_CLINIC_OR_DEPARTMENT_OTHER): Payer: Self-pay | Admitting: Obstetrics & Gynecology

## 2024-06-28 LAB — CERVICOVAGINAL ANCILLARY ONLY
Bacterial Vaginitis (gardnerella): NEGATIVE
Candida Glabrata: NEGATIVE
Candida Vaginitis: POSITIVE — AB
Comment: NEGATIVE
Comment: NEGATIVE
Comment: NEGATIVE
Comment: NEGATIVE
Trichomonas: NEGATIVE

## 2024-07-03 ENCOUNTER — Other Ambulatory Visit (HOSPITAL_BASED_OUTPATIENT_CLINIC_OR_DEPARTMENT_OTHER)

## 2024-07-03 ENCOUNTER — Other Ambulatory Visit (HOSPITAL_BASED_OUTPATIENT_CLINIC_OR_DEPARTMENT_OTHER): Payer: Self-pay | Admitting: Obstetrics & Gynecology

## 2024-07-03 ENCOUNTER — Ambulatory Visit (HOSPITAL_BASED_OUTPATIENT_CLINIC_OR_DEPARTMENT_OTHER): Admitting: Obstetrics & Gynecology

## 2024-07-03 DIAGNOSIS — R140 Abdominal distension (gaseous): Secondary | ICD-10-CM

## 2024-07-03 DIAGNOSIS — B3731 Acute candidiasis of vulva and vagina: Secondary | ICD-10-CM

## 2024-07-03 MED ORDER — FLUCONAZOLE 150 MG PO TABS: 150.0000 mg | ORAL_TABLET | Freq: Once | ORAL | 0 refills | Status: AC

## 2024-07-04 DIAGNOSIS — L820 Inflamed seborrheic keratosis: Secondary | ICD-10-CM | POA: Diagnosis not present

## 2024-07-04 DIAGNOSIS — L821 Other seborrheic keratosis: Secondary | ICD-10-CM | POA: Diagnosis not present

## 2024-07-04 DIAGNOSIS — D225 Melanocytic nevi of trunk: Secondary | ICD-10-CM | POA: Diagnosis not present

## 2024-07-06 ENCOUNTER — Ambulatory Visit (HOSPITAL_BASED_OUTPATIENT_CLINIC_OR_DEPARTMENT_OTHER)

## 2024-07-07 ENCOUNTER — Ambulatory Visit (HOSPITAL_BASED_OUTPATIENT_CLINIC_OR_DEPARTMENT_OTHER)
Admission: RE | Admit: 2024-07-07 | Discharge: 2024-07-07 | Disposition: A | Discharge status: Home / Self Care | Source: Ambulatory Visit | Attending: Obstetrics & Gynecology | Admitting: Obstetrics & Gynecology

## 2024-07-07 DIAGNOSIS — R140 Abdominal distension (gaseous): Secondary | ICD-10-CM | POA: Insufficient documentation

## 2024-07-07 DIAGNOSIS — C569 Malignant neoplasm of unspecified ovary: Secondary | ICD-10-CM | POA: Diagnosis not present

## 2024-07-08 DIAGNOSIS — J3081 Allergic rhinitis due to animal (cat) (dog) hair and dander: Secondary | ICD-10-CM | POA: Diagnosis not present

## 2024-07-08 DIAGNOSIS — J3089 Other allergic rhinitis: Secondary | ICD-10-CM | POA: Diagnosis not present

## 2024-07-08 DIAGNOSIS — J301 Allergic rhinitis due to pollen: Secondary | ICD-10-CM | POA: Diagnosis not present

## 2024-07-11 ENCOUNTER — Telehealth (HOSPITAL_BASED_OUTPATIENT_CLINIC_OR_DEPARTMENT_OTHER): Payer: Self-pay | Admitting: Obstetrics & Gynecology

## 2024-07-11 NOTE — Telephone Encounter (Signed)
 Patient was contacted. tbw

## 2024-07-11 NOTE — Telephone Encounter (Signed)
 New message    The patient calling ultrasound test results.   Asking for a call back today

## 2024-07-19 ENCOUNTER — Ambulatory Visit (HOSPITAL_BASED_OUTPATIENT_CLINIC_OR_DEPARTMENT_OTHER): Payer: Self-pay | Admitting: Obstetrics & Gynecology

## 2024-08-01 DIAGNOSIS — J3089 Other allergic rhinitis: Secondary | ICD-10-CM | POA: Diagnosis not present

## 2024-08-01 DIAGNOSIS — J3081 Allergic rhinitis due to animal (cat) (dog) hair and dander: Secondary | ICD-10-CM | POA: Diagnosis not present

## 2024-08-01 DIAGNOSIS — J301 Allergic rhinitis due to pollen: Secondary | ICD-10-CM | POA: Diagnosis not present

## 2024-08-05 ENCOUNTER — Ambulatory Visit (HOSPITAL_BASED_OUTPATIENT_CLINIC_OR_DEPARTMENT_OTHER): Admitting: Obstetrics & Gynecology

## 2024-08-13 ENCOUNTER — Other Ambulatory Visit: Payer: Medicare Other

## 2024-08-16 DIAGNOSIS — H11122 Conjunctival concretions, left eye: Secondary | ICD-10-CM | POA: Diagnosis not present

## 2024-08-17 DIAGNOSIS — Z23 Encounter for immunization: Secondary | ICD-10-CM | POA: Diagnosis not present

## 2024-08-30 ENCOUNTER — Ambulatory Visit (HOSPITAL_BASED_OUTPATIENT_CLINIC_OR_DEPARTMENT_OTHER): Admitting: Obstetrics & Gynecology

## 2024-09-04 DIAGNOSIS — J3081 Allergic rhinitis due to animal (cat) (dog) hair and dander: Secondary | ICD-10-CM | POA: Diagnosis not present

## 2024-09-04 DIAGNOSIS — J301 Allergic rhinitis due to pollen: Secondary | ICD-10-CM | POA: Diagnosis not present

## 2024-09-04 DIAGNOSIS — J3089 Other allergic rhinitis: Secondary | ICD-10-CM | POA: Diagnosis not present

## 2024-09-09 ENCOUNTER — Ambulatory Visit (INDEPENDENT_AMBULATORY_CARE_PROVIDER_SITE_OTHER): Admitting: Obstetrics & Gynecology

## 2024-09-09 ENCOUNTER — Encounter (HOSPITAL_BASED_OUTPATIENT_CLINIC_OR_DEPARTMENT_OTHER): Payer: Self-pay | Admitting: Obstetrics & Gynecology

## 2024-09-09 VITALS — BP 133/89 | HR 56 | Ht 63.5 in | Wt 113.8 lb

## 2024-09-09 DIAGNOSIS — M810 Age-related osteoporosis without current pathological fracture: Secondary | ICD-10-CM | POA: Diagnosis not present

## 2024-09-09 DIAGNOSIS — N952 Postmenopausal atrophic vaginitis: Secondary | ICD-10-CM

## 2024-09-09 DIAGNOSIS — R140 Abdominal distension (gaseous): Secondary | ICD-10-CM

## 2024-09-09 DIAGNOSIS — Z1211 Encounter for screening for malignant neoplasm of colon: Secondary | ICD-10-CM

## 2024-09-09 MED ORDER — ESTRADIOL 0.1 MG/GM VA CREA: TOPICAL_CREAM | VAGINAL | 3 refills | Status: AC

## 2024-09-09 NOTE — Progress Notes (Signed)
 GYNECOLOGY  VISIT  CC:   follow up after ultrasound  HPI: 78 y.o. G64P2002 Divorced White or Caucasian female here for follow up from ultrasound. She reports no concerns or issues today.  She does need to use gas-x at times.  We discussed the findings that ovaries were not well visualized.  No free fluid was seen.  Discussed with pt cystic or solid areas on ovaries were not seem.  Also, the significant bloating she was having has improved.  Denies vaginal bleeding.    She would like to have a cologuard done this year.  Had colonoscopy in 2018.  Dr. Aneita did not recommend a follow up but she has a friend who had colon cancer at age 63.  Desires to continue some screening.    Needs estradiol  cream RF.  Needs this sent to [facility].    Has mammogram and bone density scheduled for this fall.  Taking Vit D and calcium  and doing exercise 5-6 days a weeks.  Swims two days weekly.  Body pump 2 days a week.  Weight training and walking one day a week.  Past Medical History:  Diagnosis Date   Allergic rhinitis    Anxiety    Arthritis    Asthma    Atypical chest pain    Possibly from DES   Back pain    Cataract    Hx; of   Diverticulosis    Left   Elevated cholesterol    GERD (gastroesophageal reflux disease)    Hemoptysis 2007   From reflux pharyngitis   History of nuclear stress test    Myoview  5/17: EF 70%, no scar or ischemia, excellent exercise capacity, low risk   Hypertension    Insomnia    Lactose intolerance    Lichen sclerosus et atrophicus of the vulva 2008   Biopsy-proven   Nocturia    Osteoporosis 10/2015   T score -2.5 left femoral neck   Pneumonia    Raynaud's syndrome    possible    MEDS:   Current Outpatient Medications on File Prior to Visit  Medication Sig Dispense Refill   Ascorbic Acid (VITAMIN C) 1000 MG tablet Take 3,000 mg by mouth daily.     Calcium  Carbonate-Vitamin D  (CALCIUM  + D PO) Take 1,200 mg by mouth.     Cholecalciferol (VITAMIN D  PO)  Take 2,000 Units by mouth daily.      EPINEPHrine  0.3 mg/0.3 mL IJ SOAJ injection USE AS DIRECTED AS NEEDED SYSTEMIC REACTION     ezetimibe  (ZETIA ) 10 MG tablet Take 1 tablet (10 mg total) by mouth daily. 90 tablet 3   fluticasone  (FLONASE ) 50 MCG/ACT nasal spray Place 1 spray into both nostrils as needed.      Glucosamine-Chondroitin-MSM 500-200-150 MG TABS Take 1 tablet by mouth 2 (two) times daily.     losartan  (COZAAR ) 25 MG tablet Take 0.5 tablets (12.5 mg total) by mouth daily. 45 tablet 2   MELATONIN PO Take 1 mg by mouth every other day. gummy     meloxicam (MOBIC) 7.5 MG tablet Take 7.5 mg by mouth as needed for pain.     omeprazole  (PRILOSEC) 40 MG capsule TAKE 1 CAPSULE BY MOUTH DAILY **MUST CALL MD FOR APPOINTMENT 90 capsule 3   Probiotic Product (PROBIOTIC DAILY PO) Take 1 tablet by mouth 2 (two) times daily.      rosuvastatin  (CRESTOR ) 10 MG tablet Take 1 tablet (10 mg total) by mouth daily. 90 tablet 3   sertraline (  ZOLOFT) 25 MG tablet Take 25 mg by mouth daily.     TURMERIC PO Take 1 tablet by mouth 2 (two) times daily.      aspirin  EC 81 MG tablet Take 1 tablet (81 mg total) by mouth every Monday, Wednesday, and Friday. Swallow whole.     MAGNESIUM  PO Take 1 capsule by mouth 3 (three) times a week. OTC supplement     No current facility-administered medications on file prior to visit.    ALLERGIES: Atorvastatin , Codeine, Erythromycin, Macrobid [nitrofurantoin], Sulfa antibiotics, Sulfamethoxazole, Sulfonamide derivatives, Tetracyclines & related, and Tramadol  SH:  divorced, non smoker  Review of Systems  Constitutional: Negative.   Genitourinary: Negative.     PHYSICAL EXAMINATION:    BP 133/89 (BP Location: Left Arm, Patient Position: Sitting, Cuff Size: Normal)   Pulse (!) 56   Ht 5' 3.5 (1.613 m)   Wt 113 lb 12.8 oz (51.6 kg)   SpO2 100%   BMI 19.84 kg/m     Physical Exam Constitutional:      Appearance: Normal appearance.  Neurological:     General: No  focal deficit present.     Mental Status: She is alert.  Psychiatric:        Mood and Affect: Mood normal.        Behavior: Behavior normal.     Assessment/Plan: 1. Abdominal bloating - ultrasound reviewed with pt personally  2. Colon cancer screening (Primary) - Cologuard  3. Vaginal atrophy - estradiol  (ESTRACE ) 0.1 MG/GM vaginal cream; 1 gram vaginally twice weekly  Dispense: 42.5 g; Refill: 3  4. Age-related osteoporosis without current pathological fracture - taking Vit D and calcium  - has appt scheduled - exercising 5-6 days a week  Total time with pt:  20 mintues

## 2024-09-11 ENCOUNTER — Encounter (HOSPITAL_BASED_OUTPATIENT_CLINIC_OR_DEPARTMENT_OTHER): Payer: Self-pay | Admitting: Obstetrics & Gynecology

## 2024-09-16 DIAGNOSIS — Z23 Encounter for immunization: Secondary | ICD-10-CM | POA: Diagnosis not present

## 2024-09-18 ENCOUNTER — Encounter (HOSPITAL_BASED_OUTPATIENT_CLINIC_OR_DEPARTMENT_OTHER): Payer: Self-pay | Admitting: Obstetrics & Gynecology

## 2024-09-18 DIAGNOSIS — Z1211 Encounter for screening for malignant neoplasm of colon: Secondary | ICD-10-CM | POA: Diagnosis not present

## 2024-09-23 ENCOUNTER — Ambulatory Visit (HOSPITAL_BASED_OUTPATIENT_CLINIC_OR_DEPARTMENT_OTHER)
Admission: RE | Admit: 2024-09-23 | Discharge: 2024-09-23 | Disposition: A | Discharge status: Home / Self Care | Source: Ambulatory Visit | Attending: Obstetrics & Gynecology | Admitting: Obstetrics & Gynecology

## 2024-09-23 DIAGNOSIS — M810 Age-related osteoporosis without current pathological fracture: Secondary | ICD-10-CM | POA: Diagnosis not present

## 2024-09-23 DIAGNOSIS — Z780 Asymptomatic menopausal state: Secondary | ICD-10-CM | POA: Diagnosis not present

## 2024-09-25 ENCOUNTER — Ambulatory Visit (HOSPITAL_BASED_OUTPATIENT_CLINIC_OR_DEPARTMENT_OTHER): Payer: Self-pay | Admitting: Obstetrics & Gynecology

## 2024-09-25 LAB — COLOGUARD: COLOGUARD: POSITIVE — AB

## 2024-09-27 ENCOUNTER — Ambulatory Visit (HOSPITAL_BASED_OUTPATIENT_CLINIC_OR_DEPARTMENT_OTHER): Payer: Self-pay | Admitting: Obstetrics & Gynecology

## 2024-10-01 DIAGNOSIS — K219 Gastro-esophageal reflux disease without esophagitis: Secondary | ICD-10-CM | POA: Diagnosis not present

## 2024-10-01 DIAGNOSIS — J3089 Other allergic rhinitis: Secondary | ICD-10-CM | POA: Diagnosis not present

## 2024-10-01 DIAGNOSIS — J4530 Mild persistent asthma, uncomplicated: Secondary | ICD-10-CM | POA: Diagnosis not present

## 2024-10-01 DIAGNOSIS — H1045 Other chronic allergic conjunctivitis: Secondary | ICD-10-CM | POA: Diagnosis not present

## 2024-10-07 ENCOUNTER — Other Ambulatory Visit (HOSPITAL_BASED_OUTPATIENT_CLINIC_OR_DEPARTMENT_OTHER): Payer: Self-pay | Admitting: Obstetrics & Gynecology

## 2024-10-07 ENCOUNTER — Encounter (HOSPITAL_BASED_OUTPATIENT_CLINIC_OR_DEPARTMENT_OTHER): Payer: Self-pay | Admitting: Obstetrics & Gynecology

## 2024-10-07 ENCOUNTER — Telehealth (HOSPITAL_BASED_OUTPATIENT_CLINIC_OR_DEPARTMENT_OTHER): Admitting: Obstetrics & Gynecology

## 2024-10-07 DIAGNOSIS — M810 Age-related osteoporosis without current pathological fracture: Secondary | ICD-10-CM

## 2024-10-07 DIAGNOSIS — Z803 Family history of malignant neoplasm of breast: Secondary | ICD-10-CM

## 2024-10-07 NOTE — Progress Notes (Unsigned)
 SABRA

## 2024-10-08 ENCOUNTER — Telehealth: Payer: Self-pay | Admitting: Internal Medicine

## 2024-10-08 NOTE — Telephone Encounter (Signed)
 Inbound call from patient stating that she is scheduled for an office visit with [facility] on 12/2 at 8:20. She states she had a positive cologuard and since has had change in her bowel habits. She states she is very concerned and wants to speak with someone because she was told by Dr. Abran that she wouldn't need another colonoscopy. Patient is requesting a call back today between 3-5. Please advise.

## 2024-10-09 NOTE — Telephone Encounter (Signed)
 Inbound call from patient requesting a call back today due to her feeling very anxious about what was sent in the previous message below. I advised the patient the nurses do have 24-48 to respond to her message. Patient stated she is requesting for the to reach out today and that they have until 5 o'clock pm to. Please advise.

## 2024-10-09 NOTE — Telephone Encounter (Signed)
 Pt states at her last colon in 2018 Dr. Abran told her she likely would not need another one done. She has a friend that was told the same thing and she developed colon cancer. Pt got her gyn to order a cologuard and it was positive. Now pt is quite anxious and would like to be seen to discuss. Pt scheduled to see Dr. Abran 10/17/24 at 10:40am. She is aware of appt.

## 2024-10-10 ENCOUNTER — Other Ambulatory Visit: Payer: Self-pay | Admitting: Internal Medicine

## 2024-10-10 ENCOUNTER — Other Ambulatory Visit: Payer: Self-pay | Admitting: Cardiology

## 2024-10-10 DIAGNOSIS — E782 Mixed hyperlipidemia: Secondary | ICD-10-CM

## 2024-10-10 DIAGNOSIS — I2510 Atherosclerotic heart disease of native coronary artery without angina pectoris: Secondary | ICD-10-CM

## 2024-10-10 DIAGNOSIS — Z961 Presence of intraocular lens: Secondary | ICD-10-CM | POA: Diagnosis not present

## 2024-10-14 ENCOUNTER — Inpatient Hospital Stay

## 2024-10-14 ENCOUNTER — Other Ambulatory Visit: Payer: Self-pay

## 2024-10-14 DIAGNOSIS — Z803 Family history of malignant neoplasm of breast: Secondary | ICD-10-CM | POA: Insufficient documentation

## 2024-10-14 DIAGNOSIS — Z1379 Encounter for other screening for genetic and chromosomal anomalies: Secondary | ICD-10-CM | POA: Diagnosis not present

## 2024-10-14 DIAGNOSIS — Z807 Family history of other malignant neoplasms of lymphoid, hematopoietic and related tissues: Secondary | ICD-10-CM | POA: Insufficient documentation

## 2024-10-14 DIAGNOSIS — Z809 Family history of malignant neoplasm, unspecified: Secondary | ICD-10-CM | POA: Diagnosis not present

## 2024-10-14 LAB — GENETIC SCREENING ORDER

## 2024-10-14 NOTE — Progress Notes (Signed)
 REFERRING PROVIDER: Janey Santos, MD [address] [place],  [place] [address]  PRIMARY PROVIDER:  Janey Santos, MD  PRIMARY REASON FOR VISIT:  No diagnosis found.  HISTORY OF PRESENT ILLNESS:   Madison Kramer, a 78 y.o. female, was seen for a [facility] genetics consultation at the request of Avva, Ravisankar, MD due to a family history of breast cancer.  Madison Kramer presents to clinic today to discuss the possibility of a hereditary predisposition to cancer, genetic testing, and to further clarify her future cancer risks, as well as potential cancer risks for family members.   CANCER HISTORY:  Madison Kramer is a 78 y.o. female with no personal history of cancer.    RISK FACTORS:  Menarche was at age 50.  First live birth at age 40.  OCP use for approximately 1 years.  Ovaries intact: yes.  Hysterectomy: no.  Menopausal status: postmenopausal.  HRT use: 0 years. Colonoscopy: yes; 2025 Abnormal Cologuard. Mammogram within the last year: yes. Number of breast biopsies: 0. Any excessive radiation exposure or other environmental exposures the past: no Tobacco Use: Former Past Medical History:  Diagnosis Date   Allergic rhinitis    Anxiety    Arthritis    Asthma    Atypical chest pain    Possibly from DES   Back pain    Cataract    Hx; of   Diverticulosis    Left   Elevated cholesterol    GERD (gastroesophageal reflux disease)    Hemoptysis 2007   From reflux pharyngitis   History of nuclear stress test    Myoview  5/17: EF 70%, no scar or ischemia, excellent exercise capacity, low risk   Hypertension    Insomnia    Lactose intolerance    Lichen sclerosus et atrophicus of the vulva 2008   Biopsy-proven   Nocturia    Osteoporosis 10/2015   T score -2.5 left femoral neck   Pneumonia    Raynaud's syndrome    possible    Past Surgical History:  Procedure Laterality Date   BREAST SURGERY  2001   REDUCTION MAMMAPLASTY   COLONOSCOPY     FOOT  SURGERY  2009   HYSTEROSCOPY  2003   HYSTEROSCOPIC RESECTION OF MYOMA   INGUINAL HERNIA REPAIR  1971   BILATERAL   LUMBAR LAMINECTOMY/DECOMPRESSION MICRODISCECTOMY Right 08/30/2013   Procedure: Right Lumbar four-five Extraforaminal Diskectomy;  Surgeon: Lamar LELON Peaches, MD;  Location: [facility];  Service: Neurosurgery;  Laterality: Right;   MANDIBLE SURGERY     MYOMECTOMY     TONSILLECTOMY     TUBAL LIGATION  1987    Social History   Socioeconomic History   Marital status: Divorced    Spouse name: Not on file   Number of children: 2   Years of education: Not on file   Highest education level: Not on file  Occupational History   Occupation: REaltor    Employer: [employer]  Tobacco Use   Smoking status: Former    Current packs/day: 0.00    Types: Cigarettes    Start date: 12/12/1968    Quit date: 12/12/1988    Years since quitting: 35.8   Smokeless tobacco: Never  Vaping Use   Vaping status: Never Used  Substance and Sexual Activity   Alcohol  use: Yes    Alcohol /week: 7.0 standard drinks of alcohol     Types: 7 Standard drinks or equivalent per week    Comment: 7 glasses   Drug use: No  Sexual activity: Not Currently    Birth control/protection: Post-menopausal    Comment: 1st intercourse 78 yo-Fewer than 5 partners  Other Topics Concern   Not on file  Social History Narrative   Not on file   Social Drivers of Health   Financial Resource Strain: Not on file  Food Insecurity: Not on file  Transportation Needs: Not on file  Physical Activity: Not on file  Stress: Not on file  Social Connections: Not on file     FAMILY HISTORY:  We obtained a detailed, 4-generation family history pasted below.   Madison Kramer is unaware of relatives completing genetic testing for hereditary cancer risks.  There is reported Ashkenazi Jewish ancestry.   Significant diagnoses are listed below: Family History  Problem Relation Age of Onset   Heart disease Mother        died at  79; ? MI   Hypertension Mother    Diabetes Mother    Multiple myeloma Father    Cancer Paternal Grandmother        Unknwon Cancer   Heart disease Paternal Grandfather        age 65 - died of MI   Breast cancer Maternal Aunt        Age 20's   Cancer Paternal Aunt        Unknown Cancer Dx. 50+   Cancer Paternal Uncle        Unknown Cancer Dx. 50+   Breast cancer Paternal Cousin 25   Breast cancer Paternal Cousin    Cancer Paternal Cousin        d. Childhood cancer     GENETIC COUNSELING ASSESSMENT: Madison Kramer is a 78 y.o. female with a family history of breast cancer which is somewhat suggestive of a hereditary cancer predisposition syndrome. We, therefore, discussed and recommended the following at today's visit.   DISCUSSION: We discussed that, in general, most cancer is not inherited in families, but instead is sporadic or familial. Sporadic cancers occur by chance and typically happen at older ages (>50 years) as this type of cancer is caused by genetic changes acquired during an individual's lifetime. Some families have more cancers than would be expected by chance; however, the ages or types of cancer are not consistent with a known genetic mutation or known genetic mutations have been ruled out. This type of familial cancer is thought to be due to a combination of multiple genetic, environmental, hormonal, and lifestyle factors. While this combination of factors likely increases the risk of cancer, the exact source of this risk is not currently identifiable or testable.  We discussed that 5-10% of cancer is the result of germline (heritable) genetic variants, with most cases associated with BRCA1/BRCA2. There are other genes that can be associated with hereditary cancer syndromes. We discussed that testing is beneficial for several reasons including knowing how to follow individuals after completing their treatment, identifying whether potential treatment options such as PARP  inhibitors would be beneficial, and understanding if other family members could be at risk for cancer and allow them to undergo genetic testing.   We reviewed the characteristics, features and inheritance patterns of hereditary cancer syndromes. We also discussed genetic testing, including the appropriate family members to test, the process of testing, insurance coverage and turn-around-time for results. We discussed the implications of a negative, positive, carrier and/or variant of uncertain significant result. Madison Kramer  was offered a common hereditary cancer panel (40 genes) and an expanded pan-cancer panel (77 genes).  Madison Kramer was informed of the benefits and limitations of each panel, including that expanded pan-cancer panels contain genes that do not have clear management guidelines at this point in time.  We also discussed that as the number of genes included on a panel increases, the chances of variants of uncertain significance increases.  GENETIC TESTING NATIONAL CRITERIA: Based on Madison Kramer's family history of breast cancer below age 14 in her maternal aunt she meets medical criteria for genetic testing based on the [employer] (NCCN) guidelines. Though Madison Kramer is not personally affected, there are no affected family members that are willing/able/available to undergo hereditary cancer testing. Therefore, Madison Kramer the most informative family member available. Despite that she meets criteria, she may still have an out of pocket cost. We discussed that if her out of pocket cost for testing is over $100, the laboratory will call and confirm whether she wants to proceed with testing.  If the out of pocket cost of testing is less than $100 she will be billed by the genetic testing laboratory.   GENETIC TESTING CONSENT:  After considering the risks, benefits, and limitations, Madison Kramer provided informed consent to pursue genetic testing. A blood sample was  sent to Madison Kramer for analysis of the CancerNext+RNA Panel. Results should be available within approximately 2-3 weeks' time, at which point they will be disclosed by telephone to Madison Kramer , as will any additional recommendations warranted by these results. Madison Kramer will receive a summary of her genetic counseling visit and a copy of her results once available. This information will also be available in Epic.  Madison Kramer gene panel which includes sequencing, rearrangement analysis, and RNA analysis for the following 40 genes: APC, ATM, BAP1, BARD1, BMPR1A, BRCA1, BRCA2, BRIP1, CDH1, CDKN2A, CHEK2, FH, FLCN, MET, MLH1, MSH2, MSH6, MUTYH, NF1, NTHL1, PALB2, PMS2, PTEN, RAD51C, RAD51D, RPS20, SMAD4, STK11, TP53, TSC1, TSC2, and VHL (sequencing and deletion/duplication); AXIN2, HOXB13, MBD4, MSH3, POLD1 and POLE (sequencing only); EPCAM and GREM1 (deletion/duplication only).   GENETIC INFORMATION NONDISCRIMINATION ACT (GINA): We discussed that some people do not want to undergo genetic testing due to fear of genetic discrimination.  The Genetic Information Nondiscrimination Act (GINA) was signed into federal law in 2008. GINA prohibits health insurers and most employers from discriminating against individuals based on genetic information (including the results of genetic tests and family history information). According to GINA, health insurance companies cannot consider genetic information to be a preexisting condition, nor can they use it to make decisions regarding coverage or rates. GINA also makes it illegal for most employers to use genetic information in making decisions about hiring, firing, promotion, or terms of employment. It is important to note that GINA does not offer protections for life insurance, disability insurance, or long-term care insurance. GINA does not apply to those in the [employer], those who work for companies with less than 15 employees, and new life  insurance or long-term disability insurance policies.  Health status due to a cancer diagnosis is not protected under GINA. More information about GINA can be found by visiting eliteclients.be. Lastly, we encouraged Lolitha Tortora to remain in contact with cancer genetics annually so that we can continuously update the family history and inform her of any changes in cancer genetics and testing that may be of benefit for this family.   Kairie Melgoza's questions were answered to her satisfaction today. Our contact information was provided should additional questions or concerns arise. Thank you  for the referral and allowing us  to share in the care of your patient.   Resources:  Eren Puebla was provided with the following:  Madison Kramer [facility] [employer] Patient Assistance Information Sheet  PLAN:  Testing Ordered: Madison Kramer gene panel Clinic Note Faxed/Routed to [facility] PCP Avva, Ravisankar, MD   Santana Fryer, MS, [facility]  Certified Genetic Counselor  Email: [email or web address]  Phone: [phone]  I personally spent a total of 60 minutes in the care of the patient today including preparing to see the patient, placing orders, referring and communicating with other health care professionals, and documenting clinical information in the EHR.  The patient was seen alone. Drs. Lanny Stalls, and/or Gudena were available for questions, if needed. _______________________________________________________________________ For Office Staff:  Number of people involved in session: 1 Was an Intern/ student involved with case: no

## 2024-10-15 DIAGNOSIS — Z1231 Encounter for screening mammogram for malignant neoplasm of breast: Secondary | ICD-10-CM | POA: Diagnosis not present

## 2024-10-17 ENCOUNTER — Ambulatory Visit (INDEPENDENT_AMBULATORY_CARE_PROVIDER_SITE_OTHER): Admitting: Internal Medicine

## 2024-10-17 ENCOUNTER — Encounter: Payer: Self-pay | Admitting: Internal Medicine

## 2024-10-17 VITALS — BP 118/70 | HR 64 | Ht 63.5 in | Wt 113.0 lb

## 2024-10-17 DIAGNOSIS — R195 Other fecal abnormalities: Secondary | ICD-10-CM | POA: Diagnosis not present

## 2024-10-17 DIAGNOSIS — R0989 Other specified symptoms and signs involving the circulatory and respiratory systems: Secondary | ICD-10-CM | POA: Diagnosis not present

## 2024-10-17 DIAGNOSIS — R140 Abdominal distension (gaseous): Secondary | ICD-10-CM | POA: Diagnosis not present

## 2024-10-17 DIAGNOSIS — K5909 Other constipation: Secondary | ICD-10-CM | POA: Diagnosis not present

## 2024-10-17 MED ORDER — NA SULFATE-K SULFATE-MG SULF 17.5-3.13-1.6 GM/177ML PO SOLN: 1.0000 | Freq: Once | ORAL | 0 refills | Status: AC

## 2024-10-17 NOTE — Patient Instructions (Signed)
 You have been scheduled for a colonoscopy. Please follow written instructions given to you at your visit today.   If you use inhalers (even only as needed), please bring them with you on the day of your procedure.  DO NOT TAKE 7 DAYS PRIOR TO TEST- Trulicity (dulaglutide) Ozempic, Wegovy (semaglutide) Mounjaro, Zepbound (tirzepatide) Bydureon Bcise (exanatide extended release)  DO NOT TAKE 1 DAY PRIOR TO YOUR TEST Rybelsus (semaglutide) Adlyxin (lixisenatide) Victoza (liraglutide) Byetta (exanatide) ___________________________________________________________________________  _______________________________________________________  If your blood pressure at your visit was 140/90 or greater, please contact your primary care physician to follow up on this.  _______________________________________________________  If you are age 77 or older, your body mass index should be between 23-30. Your Body mass index is 19.7 kg/m. If this is out of the aforementioned range listed, please consider follow up with your Primary Care Provider.  If you are age 56 or younger, your body mass index should be between 19-25. Your Body mass index is 19.7 kg/m. If this is out of the aformentioned range listed, please consider follow up with your Primary Care Provider.   ________________________________________________________  The  GI providers would like to encourage you to use MYCHART to communicate with providers for non-urgent requests or questions.  Due to long hold times on the telephone, sending your provider a message by [facility] may be a faster and more efficient way to get a response.  Please allow 48 business hours for a response.  Please remember that this is for non-urgent requests.  _______________________________________________________  [facility] is using a team-based approach to care.  Your team is made up of your doctor and two to three APPS. Our APPS (Nurse  Practitioners and Physician Assistants) work with your physician to ensure care continuity for you. They are fully qualified to address your health concerns and develop a treatment plan. They communicate directly with your gastroenterologist to care for you. Seeing the Advanced Practice Practitioners on your physician's team can help you by facilitating care more promptly, often allowing for earlier appointments, access to diagnostic testing, procedures, and other specialty referrals.

## 2024-10-17 NOTE — Progress Notes (Signed)
 HISTORY OF PRESENT ILLNESS:  Madison Kramer is a 78 y.o. female who has been seen in this office for chronic throat clearing behavior (query GERD), chronic abdominal bloating, intermittent issues with constipation, diverticulosis, and screening colonoscopy.  She also has health related anxiety.  Last seen in this office June 12, 2023 regarding bloating.  See that dictation.  We recommended IBgard and Lactaid.  She was to contact the office in 1 month with clinical follow-up--she did not.  She presents today regarding: 1.  Positive Cologuard testing 2.  Chronic bloating 3.  Constipation management 4.  Throat clearing and the need for PPI  Patient tells me that friend of hers knew someone who was to have aged out of colonoscopy but was subsequently found to be anemic due to colon cancer.  This concerned the patient.  Thus, she contacted her gynecologist and ask if she could have Cologuard testing.  This returned positive.  She was not having any active GI issues at that time such as rectal bleeding, abdominal pain, or unexplained weight loss.  She is now worried.  She also has questions regarding her chronic bloating, management of constipation with Dulcolax, and if she should continue PPI regarding her throat clearing behavior.  Lots of discussion.  COLONOSCOPY.  July 2018 revealed diverticulosis and internal hemorrhoids.  Otherwise normal.  Also, 2008 (with Dr. Nolon).  Diverticulosis only.  She was felt to have aged out of routine screening. UPPER ENDOSCOPY.  December 2013.  Normal  REVIEW OF SYSTEMS:  All non-GI ROS negative except for allergies, arthritis, itching, excessive thirst, excessive urination, urinary frequency  Past Medical History:  Diagnosis Date   Allergic rhinitis    Anxiety    Arthritis    Asthma    Atypical chest pain    Possibly from DES   Back pain    Cataract    Hx; of   Diverticulosis    Left   Elevated cholesterol    Family history of multiple myeloma     Father at 66   GERD (gastroesophageal reflux disease)    Hemoptysis 12/12/2005   From reflux pharyngitis   History of nuclear stress test    Myoview  5/17: EF 70%, no scar or ischemia, excellent exercise capacity, low risk   Hypertension    Insomnia    Lactose intolerance    Lichen sclerosus et atrophicus of the vulva 12/12/2006   Biopsy-proven   Nocturia    Osteoporosis 10/13/2015   T score -2.5 left femoral neck   Pneumonia    Raynaud's syndrome    possible    Past Surgical History:  Procedure Laterality Date   BREAST SURGERY  2001   REDUCTION MAMMAPLASTY   COLONOSCOPY     FOOT SURGERY  2009   HYSTEROSCOPY  2003   HYSTEROSCOPIC RESECTION OF MYOMA   INGUINAL HERNIA REPAIR  1971   BILATERAL   LUMBAR LAMINECTOMY/DECOMPRESSION MICRODISCECTOMY Right 08/30/2013   Procedure: Right Lumbar four-five Extraforaminal Diskectomy;  Surgeon: Lamar LELON Peaches, MD;  Location: [facility];  Service: Neurosurgery;  Laterality: Right;   MANDIBLE SURGERY     MYOMECTOMY     TONSILLECTOMY     TUBAL LIGATION  1987    Social History Madison Kramer  reports that she quit smoking about 35 years ago. Her smoking use included cigarettes. She started smoking about 55 years ago. She has never used smokeless tobacco. She reports current alcohol  use of about 7.0 standard drinks of alcohol  per week. She  reports that she does not use drugs.  family history includes Breast cancer in her maternal aunt and paternal cousin; Breast cancer (age of onset: 54) in her paternal cousin; Cancer in her paternal aunt, paternal cousin, paternal grandmother, and paternal uncle; Diabetes in her mother; Heart disease in her mother and paternal grandfather; Hypertension in her mother; Multiple myeloma in her father.  Allergies  Allergen Reactions   Atorvastatin  Other (See Comments)    Pt reports causes lower extremity muscle cramps   Codeine Nausea Only    Other Reaction(s): GI Intolerance   Erythromycin Other (See  Comments) and Nausea And Vomiting    Stomach cramping  Other Reaction(s): Other (See Comments)   Macrobid [Nitrofurantoin] Nausea Only    headache   Sulfa Antibiotics     As a child   Sulfamethoxazole Other (See Comments)    Unsdure of reaction. Had as a child.   Sulfonamide Derivatives    Tetracyclines & Related     unknown   Tramadol Nausea Only and Other (See Comments)    Dizziness and mind racing       PHYSICAL EXAMINATION: Vital signs: BP 118/70   Pulse 64   Ht 5' 3.5 (1.613 m)   Wt 113 lb (51.3 kg)   BMI 19.70 kg/m   Constitutional: generally well-appearing, no acute distress Psychiatric: alert and oriented x3, cooperative Eyes: extraocular movements intact, anicteric, conjunctiva pink Mouth: oral pharynx moist, no lesions Neck: supple no lymphadenopathy Cardiovascular: heart regular rate and rhythm, no murmur Lungs: clear to auscultation bilaterally Abdomen: soft, nontender, nondistended, no obvious ascites, no peritoneal signs, normal bowel sounds, no organomegaly Rectal: Deferred until colonoscopy Extremities: no clubbing, cyanosis, or lower extremity edema bilaterally Skin: no lesions on visible extremities Neuro: No focal deficits.  Cranial nerves intact  ASSESSMENT:  1.  Positive Cologuard testing 2.  Negative for neoplasia colonoscopy 2008 and 2018 3.  Chronic intermittent constipation 4.  Chronic bloating 5.  Throat clearing.  Query GERD.  On PPI   PLAN:  1.  Colonoscopy to evaluate positive Cologuard testing.The nature of the procedure, as well as the risks, benefits, and alternatives were carefully and thoroughly reviewed with the patient. Ample time for discussion and questions allowed. The patient understood, was satisfied, and agreed to proceed. 2.  Laxative of choice.  Currently using Duca lax. 3.  Reassurance regarding bloating.  Long discussion. 4.  If PPI helps or clearing behavior, then continue.  If not, stop 5.  Ongoing general medical  care with PCP Total time of 45 minutes was spent preparing to see the patient, obtaining comprehensive medical history, performing medically appropriate physical exam, counseling and educating the patient regarding the above listed issues, directing medical therapies, ordering colonoscopy, and documenting clinical information in the health record

## 2024-10-21 ENCOUNTER — Encounter (HOSPITAL_BASED_OUTPATIENT_CLINIC_OR_DEPARTMENT_OTHER): Payer: Self-pay | Admitting: Obstetrics & Gynecology

## 2024-10-22 ENCOUNTER — Encounter: Payer: Self-pay | Admitting: Internal Medicine

## 2024-10-22 ENCOUNTER — Ambulatory Visit: Admitting: Internal Medicine

## 2024-10-22 VITALS — BP 136/88 | HR 58 | Temp 96.6°F | Resp 16 | Ht 63.5 in | Wt 113.0 lb

## 2024-10-22 DIAGNOSIS — D123 Benign neoplasm of transverse colon: Secondary | ICD-10-CM

## 2024-10-22 DIAGNOSIS — G4700 Insomnia, unspecified: Secondary | ICD-10-CM | POA: Insufficient documentation

## 2024-10-22 DIAGNOSIS — N3281 Overactive bladder: Secondary | ICD-10-CM | POA: Insufficient documentation

## 2024-10-22 DIAGNOSIS — M859 Disorder of bone density and structure, unspecified: Secondary | ICD-10-CM | POA: Insufficient documentation

## 2024-10-22 DIAGNOSIS — Z1211 Encounter for screening for malignant neoplasm of colon: Secondary | ICD-10-CM | POA: Diagnosis not present

## 2024-10-22 DIAGNOSIS — E559 Vitamin D deficiency, unspecified: Secondary | ICD-10-CM | POA: Insufficient documentation

## 2024-10-22 DIAGNOSIS — F419 Anxiety disorder, unspecified: Secondary | ICD-10-CM | POA: Diagnosis not present

## 2024-10-22 DIAGNOSIS — R195 Other fecal abnormalities: Secondary | ICD-10-CM | POA: Diagnosis not present

## 2024-10-22 DIAGNOSIS — K5730 Diverticulosis of large intestine without perforation or abscess without bleeding: Secondary | ICD-10-CM | POA: Diagnosis not present

## 2024-10-22 DIAGNOSIS — E7800 Pure hypercholesterolemia, unspecified: Secondary | ICD-10-CM | POA: Insufficient documentation

## 2024-10-22 DIAGNOSIS — K648 Other hemorrhoids: Secondary | ICD-10-CM | POA: Diagnosis not present

## 2024-10-22 DIAGNOSIS — J45909 Unspecified asthma, uncomplicated: Secondary | ICD-10-CM | POA: Diagnosis not present

## 2024-10-22 DIAGNOSIS — I10 Essential (primary) hypertension: Secondary | ICD-10-CM | POA: Diagnosis not present

## 2024-10-22 DIAGNOSIS — H1045 Other chronic allergic conjunctivitis: Secondary | ICD-10-CM | POA: Insufficient documentation

## 2024-10-22 DIAGNOSIS — K219 Gastro-esophageal reflux disease without esophagitis: Secondary | ICD-10-CM | POA: Insufficient documentation

## 2024-10-22 MED ORDER — SODIUM CHLORIDE 0.9 % IV SOLN: 500.0000 mL | Freq: Once | INTRAVENOUS | Status: DC

## 2024-10-22 NOTE — Patient Instructions (Signed)
  Thank you for letting us  take care of your healthcare needs today. Please see handouts given to you on Polyps, Diverticulosis and Hemorrhoids. Await pathology results.     YOU HAD AN ENDOSCOPIC PROCEDURE TODAY AT THE [facility]:   Refer to the procedure report that was given to you for any specific questions about what was found during the examination.  If the procedure report does not answer your questions, please call your gastroenterologist to clarify.  If you requested that your care partner not be given the details of your procedure findings, then the procedure report has been included in a sealed envelope for you to review at your convenience later.  YOU SHOULD EXPECT: Some feelings of bloating in the abdomen. Passage of more gas than usual.  Walking can help get rid of the air that was put into your GI tract during the procedure and reduce the bloating. If you had a lower endoscopy (such as a colonoscopy or flexible sigmoidoscopy) you may notice spotting of blood in your stool or on the toilet paper. If you underwent a bowel prep for your procedure, you may not have a normal bowel movement for a few days.  Please Note:  You might notice some irritation and congestion in your nose or some drainage.  This is from the oxygen used during your procedure.  There is no need for concern and it should clear up in a day or so.  SYMPTOMS TO REPORT IMMEDIATELY:  Following lower endoscopy (colonoscopy or flexible sigmoidoscopy):  Excessive amounts of blood in the stool  Significant tenderness or worsening of abdominal pains  Swelling of the abdomen that is new, acute  Fever of 100F or higher  For urgent or emergent issues, a gastroenterologist can be reached at any hour by calling [phone]. Do not use MyChart messaging for urgent concerns.    DIET:  We do recommend a small meal at first, but then you may proceed to your regular diet.  Drink plenty of fluids but you should  avoid alcoholic beverages for 24 hours.  ACTIVITY:  You should plan to take it easy for the rest of today and you should NOT DRIVE or use heavy machinery until tomorrow (because of the sedation medicines used during the test).    FOLLOW UP: Our staff will call the number listed on your records the next business day following your procedure.  We will call around 7:15- 8:00 am to check on you and address any questions or concerns that you may have regarding the information given to you following your procedure. If we do not reach you, we will leave a message.     If any biopsies were taken you will be contacted by phone or by letter within the next 1-3 weeks.  Please call us  at [phone] if you have not heard about the biopsies in 3 weeks.    SIGNATURES/CONFIDENTIALITY: You and/or your care partner have signed paperwork which will be entered into your electronic medical record.  These signatures attest to the fact that that the information above on your After Visit Summary has been reviewed and is understood.  Full responsibility of the confidentiality of this discharge information lies with you and/or your care-partner.

## 2024-10-22 NOTE — Progress Notes (Signed)
 Sedate, gd SR, tolerated procedure well, VSS, report to RN

## 2024-10-22 NOTE — Progress Notes (Signed)
 Called to room to assist during endoscopic procedure.  Patient ID and intended procedure confirmed with present staff. Received instructions for my participation in the procedure from the performing physician.

## 2024-10-22 NOTE — Progress Notes (Signed)
 Expand All Collapse All HISTORY OF PRESENT ILLNESS:   Madison Kramer is a 78 y.o. female who has been seen in this office for chronic throat clearing behavior (query GERD), chronic abdominal bloating, intermittent issues with constipation, diverticulosis, and screening colonoscopy.  She also has health related anxiety.  Last seen in this office June 12, 2023 regarding bloating.  See that dictation.  We recommended IBgard and Lactaid.  She was to contact the office in 1 month with clinical follow-up--she did not.   She presents today regarding: 1.  Positive Cologuard testing 2.  Chronic bloating 3.  Constipation management 4.  Throat clearing and the need for PPI   Patient tells me that friend of hers knew someone who was to have aged out of colonoscopy but was subsequently found to be anemic due to colon cancer.  This concerned the patient.  Thus, she contacted her gynecologist and ask if she could have Cologuard testing.  This returned positive.  She was not having any active GI issues at that time such as rectal bleeding, abdominal pain, or unexplained weight loss.  She is now worried.   She also has questions regarding her chronic bloating, management of constipation with Dulcolax, and if she should continue PPI regarding her throat clearing behavior.  Lots of discussion.   COLONOSCOPY.  July 2018 revealed diverticulosis and internal hemorrhoids.  Otherwise normal.  Also, 2008 (with Dr. Nolon).  Diverticulosis only.  She was felt to have aged out of routine screening. UPPER ENDOSCOPY.  December 2013.  Normal   REVIEW OF SYSTEMS:   All non-GI ROS negative except for allergies, arthritis, itching, excessive thirst, excessive urination, urinary frequency       Past Medical History:  Diagnosis Date   Allergic rhinitis     Anxiety     Arthritis     Asthma     Atypical chest pain      Possibly from DES   Back pain     Cataract      Hx; of   Diverticulosis      Left   Elevated  cholesterol     Family history of multiple myeloma      Father at 8   GERD (gastroesophageal reflux disease)     Hemoptysis 12/12/2005    From reflux pharyngitis   History of nuclear stress test      Myoview  5/17: EF 70%, no scar or ischemia, excellent exercise capacity, low risk   Hypertension     Insomnia     Lactose intolerance     Lichen sclerosus et atrophicus of the vulva 12/12/2006    Biopsy-proven   Nocturia     Osteoporosis 10/13/2015    T score -2.5 left femoral neck   Pneumonia     Raynaud's syndrome      possible               Past Surgical History:  Procedure Laterality Date   BREAST SURGERY   2001    REDUCTION MAMMAPLASTY   COLONOSCOPY       FOOT SURGERY   2009   HYSTEROSCOPY   2003    HYSTEROSCOPIC RESECTION OF MYOMA   INGUINAL HERNIA REPAIR   1971    BILATERAL   LUMBAR LAMINECTOMY/DECOMPRESSION MICRODISCECTOMY Right 08/30/2013    Procedure: Right Lumbar four-five Extraforaminal Diskectomy;  Surgeon: Lamar LELON Peaches, MD;  Location: [facility];  Service: Neurosurgery;  Laterality: Right;   MANDIBLE SURGERY  MYOMECTOMY       TONSILLECTOMY       TUBAL LIGATION   1987          Social History Madison Kramer  reports that she quit smoking about 35 years ago. Her smoking use included cigarettes. She started smoking about 55 years ago. She has never used smokeless tobacco. She reports current alcohol  use of about 7.0 standard drinks of alcohol  per week. She reports that she does not use drugs.   family history includes Breast cancer in her maternal aunt and paternal cousin; Breast cancer (age of onset: 57) in her paternal cousin; Cancer in her paternal aunt, paternal cousin, paternal grandmother, and paternal uncle; Diabetes in her mother; Heart disease in her mother and paternal grandfather; Hypertension in her mother; Multiple myeloma in her father.   Allergies       Allergies  Allergen Reactions   Atorvastatin  Other (See Comments)      Pt  reports causes lower extremity muscle cramps   Codeine Nausea Only      Other Reaction(s): GI Intolerance   Erythromycin Other (See Comments) and Nausea And Vomiting      Stomach cramping   Other Reaction(s): Other (See Comments)   Macrobid [Nitrofurantoin] Nausea Only      headache   Sulfa Antibiotics        As a child   Sulfamethoxazole Other (See Comments)      Unsdure of reaction. Had as a child.   Sulfonamide Derivatives     Tetracyclines & Related        unknown   Tramadol Nausea Only and Other (See Comments)      Dizziness and mind racing            PHYSICAL EXAMINATION: Vital signs: BP 118/70   Pulse 64   Ht 5' 3.5 (1.613 m)   Wt 113 lb (51.3 kg)   BMI 19.70 kg/m   Constitutional: generally well-appearing, no acute distress Psychiatric: alert and oriented x3, cooperative Eyes: extraocular movements intact, anicteric, conjunctiva pink Mouth: oral pharynx moist, no lesions Neck: supple no lymphadenopathy Cardiovascular: heart regular rate and rhythm, no murmur Lungs: clear to auscultation bilaterally Abdomen: soft, nontender, nondistended, no obvious ascites, no peritoneal signs, normal bowel sounds, no organomegaly Rectal: Deferred until colonoscopy Extremities: no clubbing, cyanosis, or lower extremity edema bilaterally Skin: no lesions on visible extremities Neuro: No focal deficits.  Cranial nerves intact   ASSESSMENT:   1.  Positive Cologuard testing 2.  Negative for neoplasia colonoscopy 2008 and 2018 3.  Chronic intermittent constipation 4.  Chronic bloating 5.  Throat clearing.  Query GERD.  On PPI     PLAN:   1.  Colonoscopy to evaluate positive Cologuard testing.The nature of the procedure, as well as the risks, benefits, and alternatives were carefully and thoroughly reviewed with the patient. Ample time for discussion and questions allowed. The patient understood, was satisfied, and agreed to proceed. 2.  Laxative of choice.  Currently using  Duca lax. 3.  Reassurance regarding bloating.  Long discussion. 4.  If PPI helps or clearing behavior, then continue.  If not, stop 5.  Ongoing general medical care with PCP

## 2024-10-22 NOTE — Op Note (Signed)
 [facility] Patient Name: Madison Kramer Procedure Date: 10/22/2024 2:37 PM MRN: [id number] Endoscopist: Norleen SAILOR. Abran , MD, [id number] Age: 78 Referring MD:  Date of Birth: September 30, 1946 Gender: Female Account #: [id number] Procedure:                Colonoscopy with cold snare polypectomy x 1 Indications:              Positive Cologuard test. Previous colonoscopies in                            2008 and 2018 were negative for neoplasia Medicines:                Monitored Anesthesia Care Procedure:                Pre-Anesthesia Assessment:                           - Prior to the procedure, a History and Physical                            was performed, and patient medications and                            allergies were reviewed. The patient's tolerance of                            previous anesthesia was also reviewed. The risks                            and benefits of the procedure and the sedation                            options and risks were discussed with the patient.                            All questions were answered, and informed consent                            was obtained. Prior Anticoagulants: The patient has                            taken no anticoagulant or antiplatelet agents. ASA                            Grade Assessment: II - A patient with mild systemic                            disease. After reviewing the risks and benefits,                            the patient was deemed in satisfactory condition to                            undergo the procedure.  After obtaining informed consent, the colonoscope                            was passed under direct vision. Throughout the                            procedure, the patient's blood pressure, pulse, and                            oxygen saturations were monitored continuously. The                            Olympus Scope SN: [id number] was introduced through                             the anus and advanced to the the cecum, identified                            by appendiceal orifice and ileocecal valve. The                            ileocecal valve, appendiceal orifice, and rectum                            were photographed. The quality of the bowel                            preparation was excellent. The colonoscopy was                            performed without difficulty. The patient tolerated                            the procedure well. The bowel preparation used was                            SUPREP via split dose instruction. Scope In: 3:01:14 PM Scope Out: 3:13:57 PM Scope Withdrawal Time: 0 hours 9 minutes 34 seconds  Total Procedure Duration: 0 hours 12 minutes 43 seconds  Findings:                 A 2 mm polyp was found in the transverse colon. The                            polyp was removed with a cold snare. Resection and                            retrieval were complete.                           Multiple diverticula were found in the sigmoid                            colon and right colon.  Internal hemorrhoids were found during retroflexion.                           The exam was otherwise without abnormality on                            direct and retroflexion views. Complications:            No immediate complications. Estimated blood loss:                            None. Estimated Blood Loss:     Estimated blood loss: none. Impression:               - One 2 mm polyp in the transverse colon, removed                            with a cold snare. Resected and retrieved.                           - Diverticulosis in the sigmoid colon and in the                            right colon.                           - Internal hemorrhoids.                           - The examination was otherwise normal on direct                            and retroflexion views. Recommendation:           - Repeat  colonoscopy is not recommended for                            surveillance.                           - Patient has a contact number available for                            emergencies. The signs and symptoms of potential                            delayed complications were discussed with the                            patient. Return to normal activities tomorrow.                            Written discharge instructions were provided to the                            patient.                           -  Resume previous diet.                           - Continue present medications.                           - Await pathology results. Norleen SAILOR. Abran, MD 10/22/2024 3:20:18 PM This report has been signed electronically.

## 2024-10-23 ENCOUNTER — Telehealth: Payer: Self-pay | Admitting: Lactation Services

## 2024-10-23 NOTE — Telephone Encounter (Signed)
  Follow up Call-     10/22/2024    2:12 PM  Call back number  Post procedure Call Back phone  # [phone]  Permission to leave phone message Yes     Patient questions:  Do you have a fever, pain , or abdominal swelling? No. Pain Score  0 *  Have you tolerated food without any problems? Yes.    Have you been able to return to your normal activities? Yes.    Do you have any questions about your discharge instructions: Diet   No. Medications  No. Follow up visit  No.  Do you have questions or concerns about your Care? No.  Actions: * If pain score is 4 or above: No action needed, pain <4.

## 2024-10-28 ENCOUNTER — Ambulatory Visit: Payer: Self-pay | Admitting: Internal Medicine

## 2024-10-28 LAB — SURGICAL PATHOLOGY

## 2024-11-01 ENCOUNTER — Telehealth: Payer: Self-pay

## 2024-11-01 NOTE — Telephone Encounter (Signed)
 I contacted  Ms. Slattery to discuss her genetic testing results. 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 See not for further details.   The test report will be scanned into EPIC and will be located under the Molecular Pathology section of the Results Review tab.  A portion of the result report is included below for reference.      Santana Fryer, MS, CGC  Certified Genetic Counselor  Email: [email or web address]  Phone: [phone]

## 2024-11-02 ENCOUNTER — Ambulatory Visit: Payer: Self-pay

## 2024-11-02 DIAGNOSIS — Z803 Family history of malignant neoplasm of breast: Secondary | ICD-10-CM | POA: Insufficient documentation

## 2024-11-02 NOTE — Progress Notes (Signed)
 Cancer Genetics Results HPI:  Madison Kramer was previously seen in the [facility] due to a family history of breast cancer and concerns regarding a hereditary predisposition to cancer. Please refer to our prior cancer genetics clinic note for more information regarding our discussion, assessment and recommendations, at the time. Madison Kramer recent genetic test results were disclosed to her, as were recommendations warranted by these results. These results and recommendations are discussed in more detail below.  Pedigree Summary: Family history of Ashkenazi Jewish ancestry Maternal Aunt  - Breast cancer dx. 57 Father - Multiple Myeloma dx. 62 Paternal 1st Cousin 1- Breast Cancer dx. 45 Paternal 1st Cousin 2 - Breast Cancer dx. 45    FAMILY HISTORY:  We obtained a detailed, 4-generation family history.  Significant diagnoses are listed below: Family History  Problem Relation Age of Onset   Heart disease Mother        died at 67; ? MI   Hypertension Mother    Diabetes Mother    Multiple myeloma Father    Cancer Paternal Grandmother        Unknwon Cancer   Heart disease Paternal Grandfather        age 55 - died of MI   Breast cancer Maternal Aunt        Age 32's   Cancer Paternal Aunt        Unknown Cancer Dx. 50+   Cancer Paternal Uncle        Unknown Cancer Dx. 50+   Breast cancer Paternal Cousin 66   Breast cancer Paternal Cousin    Cancer Paternal Cousin        d. 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  Test Ordered: [facility] + RNAinsight gene panel which includes sequencing, rearrangement analysis, and RNA analysis for the following 40 genes: APC, ATM, BAP1, BARD1, BMPR1A, BRCA1, BRCA2, BRIP1, CDH1, CDKN2A, CHEK2, FH, FLCN, MET, MLH1, MSH2, MSH6, MUTYH, NF1, NTHL1,  PALB2, PMS2, PTEN, RAD51C, RAD51D, RPS20, SMAD4, STK11, TP53, TSC1, TSC2, and VHL (sequencing and deletion/duplication); AXIN2, HOXB13, MBD4, MSH3, POLD1 and POLE (sequencing only); EPCAM and GREM1 (deletion/duplication only).  The test report has been scanned into EPIC and is located under the Molecular Pathology section of the Results Review tab.    A portion of the result report is included below for reference. Genetic testing reported out on 10/25/2024.   Cancer Risks for CHEK2: Women have a 23-27% lifetime risk of breast cancer. In women with a history of breast cancer, the 10-year cumulative risk for contralateral breast cancer is 6-8%.  Men are thought to be at an increased risk of prostate cancer. The exact risk figure is unknown at this time. Data suggests a possible increased risk for ovarian, endometrial, thyroid , and other types of cancer    Research is continuing to help learn more about the cancers associated with CHEK2 pathogenic variants and what the exact risks are to develop these cancers.  Management Recommendations:  Breast Cancer Screening/Risk Reduction: Low penetrance variants in CHEK2 are not clinically actionable in isolation for breast cancer risk.  Breast surveillance should be based on personalized assessment including family history as a minimum  Colon Cancer Screening:  Manage based on personal and family history  For individuals with a first degree relative (parent, sibling, child) diagnosed with colorectal  cancer, it is recommended to begin colonoscopy at age 86 or 10 years before the age of diagnosis of the relative with colorectal cancer and repeat every five years or as recommended by a GI provider based on findings.  Prostate Cancer Conduct personalized risk assessment, including family history and other factors, to determine appropriate management Can consider beginning annual PSA blood test and digital rectal exams at age 76.   This information is  based on current understanding of the gene and may change in the future.  Implications for Family Members: Hereditary predisposition to cancer due to pathogenic variants in the CHEK2 gene has autosomal dominant inheritance. This means that an individual with a pathogenic variant has a 50% chance of passing the condition on to his/her offspring. Identification of a pathogenic variant allows for the recognition of at-risk relatives who can pursue testing for the familial variant.   Family members are encouraged to consider genetic testing for this familial pathogenic variant. As there are generally no childhood cancer risks associated with pathogenic variants in the CHEK2 gene, individuals in the family are not recommended to have testing until they reach at least 77 years of age. Complimentary testing for the familial variant is available for 90 days. They may contact our office at [phone] for more information or to schedule an appointment. Family members who live outside of the area are encouraged to find a genetic counselor in their area by visiting: [email or web address].   Resources: FORCE (Facing Our Risk of Cancer Empowered) is a resource for those with a hereditary predisposition to develop cancer.  FORCE provides information about risk reduction, advocacy, legislation, and clinical trials.  Additionally, [employer] provides a platform for collaboration and support; which includes: peer navigation, message boards, local support groups, a toll-free helpline, research registry and recruitment, advocate training, published medical research, webinars, brochures, mastectomy photos, and more.  For more information, visit www.facingourrisk.org  Plan: We encourage Madison Kramer to share this information with her health care providers.  Continue annual mammogram.  Share result with family members, who could consider completing testing. Family members diagnosed with cancer should have  their own testing for other genes as well.   We encouraged Madison Kramer to remain in contact with us  on an annual basis so we can update her personal and family histories, and let her know of advances in cancer genetics that may benefit the family. Our contact number was provided. Ms. Richins questions were answered to her satisfaction today, and she knows she is welcome to call anytime with additional questions.     Santana Fryer, MS, CGC  Certified Genetic Counselor  Email: [email or web address]  Phone: [phone]

## 2024-11-04 ENCOUNTER — Encounter: Payer: Self-pay | Admitting: Licensed Clinical Social Worker

## 2024-11-12 ENCOUNTER — Ambulatory Visit: Admitting: Gastroenterology

## 2024-11-21 DIAGNOSIS — L814 Other melanin hyperpigmentation: Secondary | ICD-10-CM | POA: Diagnosis not present

## 2024-11-21 DIAGNOSIS — L738 Other specified follicular disorders: Secondary | ICD-10-CM | POA: Diagnosis not present

## 2024-11-22 ENCOUNTER — Other Ambulatory Visit: Payer: Self-pay | Admitting: Cardiology

## 2024-11-25 MED ORDER — ROSUVASTATIN CALCIUM 10 MG PO TABS: 10.0000 mg | ORAL_TABLET | Freq: Every day | ORAL | 0 refills | Status: AC

## 2024-12-26 ENCOUNTER — Telehealth (HOSPITAL_BASED_OUTPATIENT_CLINIC_OR_DEPARTMENT_OTHER): Payer: Self-pay

## 2024-12-26 NOTE — Telephone Encounter (Signed)
 Patient left message on nurses' line asking if Dr. Cleotilde could send in a prescription for her. She thinks she either has a UTI or yeast infection.   Left message for patient that she needs to schedule nurse visit.   Madison LOISE Quale, RN

## 2025-01-09 ENCOUNTER — Other Ambulatory Visit: Payer: Self-pay

## 2025-01-09 DIAGNOSIS — I2510 Atherosclerotic heart disease of native coronary artery without angina pectoris: Secondary | ICD-10-CM

## 2025-01-09 DIAGNOSIS — E782 Mixed hyperlipidemia: Secondary | ICD-10-CM

## 2025-01-14 DIAGNOSIS — Z1589 Genetic susceptibility to other disease: Secondary | ICD-10-CM | POA: Insufficient documentation

## 2025-01-14 MED ORDER — EZETIMIBE 10 MG PO TABS: 10.0000 mg | ORAL_TABLET | Freq: Every day | ORAL | 0 refills | Status: AC

## 2025-01-14 NOTE — Telephone Encounter (Signed)
 Overdue Labs. Can be Completed at upcoming followup appt with Cardiology.

## 2025-01-27 ENCOUNTER — Ambulatory Visit: Admitting: Cardiology
